# Patient Record
Sex: Male | Born: 1935
Health system: Southern US, Community
[De-identification: ages and names within clinical notes are randomized; demographics above are authoritative.]

## PROBLEM LIST (undated history)

## (undated) DIAGNOSIS — N189 Chronic kidney disease, unspecified: Secondary | ICD-10-CM

## (undated) DIAGNOSIS — Z972 Presence of dental prosthetic device (complete) (partial): Secondary | ICD-10-CM

## (undated) DIAGNOSIS — G4733 Obstructive sleep apnea (adult) (pediatric): Secondary | ICD-10-CM

## (undated) DIAGNOSIS — I10 Essential (primary) hypertension: Secondary | ICD-10-CM

## (undated) DIAGNOSIS — K5790 Diverticulosis of intestine, part unspecified, without perforation or abscess without bleeding: Secondary | ICD-10-CM

## (undated) DIAGNOSIS — E119 Type 2 diabetes mellitus without complications: Secondary | ICD-10-CM

## (undated) DIAGNOSIS — I509 Heart failure, unspecified: Secondary | ICD-10-CM

## (undated) DIAGNOSIS — E039 Hypothyroidism, unspecified: Secondary | ICD-10-CM

## (undated) DIAGNOSIS — E7800 Pure hypercholesterolemia, unspecified: Secondary | ICD-10-CM

## (undated) DIAGNOSIS — K219 Gastro-esophageal reflux disease without esophagitis: Secondary | ICD-10-CM

## (undated) HISTORY — PX: STOMACH SURGERY: SHX791

## (undated) HISTORY — PX: BACK SURGERY: SHX140

## (undated) HISTORY — PX: PILONIDAL CYST EXCISION: SHX744

## (undated) HISTORY — PX: HERNIA REPAIR: SHX51

## (undated) HISTORY — DX: Obstructive sleep apnea (adult) (pediatric): G47.33

## (undated) HISTORY — DX: Heart failure, unspecified: I50.9

## (undated) HISTORY — PX: COLONOSCOPY: SHX174

---

## 2005-09-14 ENCOUNTER — Other Ambulatory Visit: Payer: Self-pay

## 2005-09-14 ENCOUNTER — Emergency Department: Payer: Self-pay | Admitting: Emergency Medicine

## 2006-04-08 ENCOUNTER — Ambulatory Visit: Payer: Self-pay | Admitting: General Surgery

## 2006-05-08 ENCOUNTER — Ambulatory Visit: Payer: Self-pay | Admitting: Internal Medicine

## 2006-06-12 ENCOUNTER — Ambulatory Visit: Payer: Self-pay | Admitting: General Surgery

## 2006-06-19 ENCOUNTER — Inpatient Hospital Stay: Payer: Self-pay | Admitting: General Surgery

## 2006-08-04 ENCOUNTER — Ambulatory Visit: Payer: Self-pay | Admitting: Vascular Surgery

## 2009-04-06 ENCOUNTER — Ambulatory Visit: Payer: Self-pay | Admitting: Family Medicine

## 2009-11-06 LAB — CBC AND DIFFERENTIAL
HCT: 41 % (ref 41–53)
Hemoglobin: 13.5 g/dL (ref 13.5–17.5)
Neutrophils Absolute: 65 /uL
Platelets: 165 10*3/uL (ref 150–399)
WBC: 7.1 10^3/mL

## 2011-04-03 DIAGNOSIS — E7800 Pure hypercholesterolemia, unspecified: Secondary | ICD-10-CM | POA: Diagnosis not present

## 2011-04-03 DIAGNOSIS — E669 Obesity, unspecified: Secondary | ICD-10-CM | POA: Diagnosis not present

## 2011-04-03 DIAGNOSIS — I10 Essential (primary) hypertension: Secondary | ICD-10-CM | POA: Diagnosis not present

## 2011-04-03 DIAGNOSIS — E119 Type 2 diabetes mellitus without complications: Secondary | ICD-10-CM | POA: Diagnosis not present

## 2011-06-05 DIAGNOSIS — I10 Essential (primary) hypertension: Secondary | ICD-10-CM | POA: Diagnosis not present

## 2011-06-05 DIAGNOSIS — E7800 Pure hypercholesterolemia, unspecified: Secondary | ICD-10-CM | POA: Diagnosis not present

## 2011-06-05 DIAGNOSIS — E669 Obesity, unspecified: Secondary | ICD-10-CM | POA: Diagnosis not present

## 2011-06-05 DIAGNOSIS — E119 Type 2 diabetes mellitus without complications: Secondary | ICD-10-CM | POA: Diagnosis not present

## 2011-08-05 DIAGNOSIS — I10 Essential (primary) hypertension: Secondary | ICD-10-CM | POA: Diagnosis not present

## 2011-08-05 DIAGNOSIS — E7800 Pure hypercholesterolemia, unspecified: Secondary | ICD-10-CM | POA: Diagnosis not present

## 2011-08-05 DIAGNOSIS — M129 Arthropathy, unspecified: Secondary | ICD-10-CM | POA: Diagnosis not present

## 2011-08-05 DIAGNOSIS — E119 Type 2 diabetes mellitus without complications: Secondary | ICD-10-CM | POA: Diagnosis not present

## 2011-10-08 DIAGNOSIS — Z23 Encounter for immunization: Secondary | ICD-10-CM | POA: Diagnosis not present

## 2011-12-02 DIAGNOSIS — E7800 Pure hypercholesterolemia, unspecified: Secondary | ICD-10-CM | POA: Diagnosis not present

## 2011-12-02 DIAGNOSIS — E785 Hyperlipidemia, unspecified: Secondary | ICD-10-CM | POA: Diagnosis not present

## 2011-12-02 DIAGNOSIS — E119 Type 2 diabetes mellitus without complications: Secondary | ICD-10-CM | POA: Diagnosis not present

## 2011-12-02 DIAGNOSIS — I10 Essential (primary) hypertension: Secondary | ICD-10-CM | POA: Diagnosis not present

## 2011-12-02 DIAGNOSIS — E039 Hypothyroidism, unspecified: Secondary | ICD-10-CM | POA: Diagnosis not present

## 2011-12-02 DIAGNOSIS — E559 Vitamin D deficiency, unspecified: Secondary | ICD-10-CM | POA: Diagnosis not present

## 2011-12-02 DIAGNOSIS — M129 Arthropathy, unspecified: Secondary | ICD-10-CM | POA: Diagnosis not present

## 2011-12-30 DIAGNOSIS — M129 Arthropathy, unspecified: Secondary | ICD-10-CM | POA: Diagnosis not present

## 2011-12-30 DIAGNOSIS — E669 Obesity, unspecified: Secondary | ICD-10-CM | POA: Diagnosis not present

## 2011-12-30 DIAGNOSIS — I10 Essential (primary) hypertension: Secondary | ICD-10-CM | POA: Diagnosis not present

## 2011-12-30 DIAGNOSIS — E119 Type 2 diabetes mellitus without complications: Secondary | ICD-10-CM | POA: Diagnosis not present

## 2012-03-30 DIAGNOSIS — I10 Essential (primary) hypertension: Secondary | ICD-10-CM | POA: Diagnosis not present

## 2012-03-30 DIAGNOSIS — E119 Type 2 diabetes mellitus without complications: Secondary | ICD-10-CM | POA: Diagnosis not present

## 2012-03-30 DIAGNOSIS — E669 Obesity, unspecified: Secondary | ICD-10-CM | POA: Diagnosis not present

## 2012-06-08 DIAGNOSIS — E079 Disorder of thyroid, unspecified: Secondary | ICD-10-CM | POA: Diagnosis not present

## 2012-06-08 DIAGNOSIS — E039 Hypothyroidism, unspecified: Secondary | ICD-10-CM | POA: Diagnosis not present

## 2012-06-08 DIAGNOSIS — E119 Type 2 diabetes mellitus without complications: Secondary | ICD-10-CM | POA: Diagnosis not present

## 2012-06-08 DIAGNOSIS — Z23 Encounter for immunization: Secondary | ICD-10-CM | POA: Diagnosis not present

## 2012-06-08 DIAGNOSIS — E559 Vitamin D deficiency, unspecified: Secondary | ICD-10-CM | POA: Diagnosis not present

## 2012-11-19 DIAGNOSIS — E785 Hyperlipidemia, unspecified: Secondary | ICD-10-CM | POA: Diagnosis not present

## 2012-11-19 DIAGNOSIS — E7800 Pure hypercholesterolemia, unspecified: Secondary | ICD-10-CM | POA: Diagnosis not present

## 2012-11-19 DIAGNOSIS — I10 Essential (primary) hypertension: Secondary | ICD-10-CM | POA: Diagnosis not present

## 2012-11-19 DIAGNOSIS — E039 Hypothyroidism, unspecified: Secondary | ICD-10-CM | POA: Diagnosis not present

## 2012-11-19 DIAGNOSIS — E119 Type 2 diabetes mellitus without complications: Secondary | ICD-10-CM | POA: Diagnosis not present

## 2012-11-19 DIAGNOSIS — Z23 Encounter for immunization: Secondary | ICD-10-CM | POA: Diagnosis not present

## 2012-12-21 DIAGNOSIS — I10 Essential (primary) hypertension: Secondary | ICD-10-CM | POA: Diagnosis not present

## 2012-12-21 DIAGNOSIS — Z1331 Encounter for screening for depression: Secondary | ICD-10-CM | POA: Diagnosis not present

## 2012-12-21 DIAGNOSIS — F4320 Adjustment disorder, unspecified: Secondary | ICD-10-CM | POA: Diagnosis not present

## 2012-12-21 DIAGNOSIS — E119 Type 2 diabetes mellitus without complications: Secondary | ICD-10-CM | POA: Diagnosis not present

## 2012-12-21 DIAGNOSIS — E7800 Pure hypercholesterolemia, unspecified: Secondary | ICD-10-CM | POA: Diagnosis not present

## 2012-12-21 DIAGNOSIS — E669 Obesity, unspecified: Secondary | ICD-10-CM | POA: Diagnosis not present

## 2013-01-06 DIAGNOSIS — H2510 Age-related nuclear cataract, unspecified eye: Secondary | ICD-10-CM | POA: Diagnosis not present

## 2013-03-01 DIAGNOSIS — I10 Essential (primary) hypertension: Secondary | ICD-10-CM | POA: Diagnosis not present

## 2013-03-01 DIAGNOSIS — E1129 Type 2 diabetes mellitus with other diabetic kidney complication: Secondary | ICD-10-CM | POA: Diagnosis not present

## 2013-03-01 DIAGNOSIS — E7800 Pure hypercholesterolemia, unspecified: Secondary | ICD-10-CM | POA: Diagnosis not present

## 2013-06-30 DIAGNOSIS — E7800 Pure hypercholesterolemia, unspecified: Secondary | ICD-10-CM | POA: Diagnosis not present

## 2013-06-30 DIAGNOSIS — Z1331 Encounter for screening for depression: Secondary | ICD-10-CM | POA: Diagnosis not present

## 2013-06-30 DIAGNOSIS — I10 Essential (primary) hypertension: Secondary | ICD-10-CM | POA: Diagnosis not present

## 2013-06-30 DIAGNOSIS — Z125 Encounter for screening for malignant neoplasm of prostate: Secondary | ICD-10-CM | POA: Diagnosis not present

## 2013-06-30 DIAGNOSIS — Z0000 Encounter for general adult medical examination without abnormal findings: Secondary | ICD-10-CM | POA: Diagnosis not present

## 2013-06-30 DIAGNOSIS — Z1339 Encounter for screening examination for other mental health and behavioral disorders: Secondary | ICD-10-CM | POA: Diagnosis not present

## 2013-06-30 LAB — PSA: PSA: 5.4

## 2013-08-30 DIAGNOSIS — E785 Hyperlipidemia, unspecified: Secondary | ICD-10-CM | POA: Diagnosis not present

## 2013-08-30 DIAGNOSIS — E039 Hypothyroidism, unspecified: Secondary | ICD-10-CM | POA: Diagnosis not present

## 2013-08-30 DIAGNOSIS — E7800 Pure hypercholesterolemia, unspecified: Secondary | ICD-10-CM | POA: Diagnosis not present

## 2013-08-30 DIAGNOSIS — E669 Obesity, unspecified: Secondary | ICD-10-CM | POA: Diagnosis not present

## 2013-08-30 DIAGNOSIS — E1129 Type 2 diabetes mellitus with other diabetic kidney complication: Secondary | ICD-10-CM | POA: Diagnosis not present

## 2013-08-30 DIAGNOSIS — I10 Essential (primary) hypertension: Secondary | ICD-10-CM | POA: Diagnosis not present

## 2013-08-30 DIAGNOSIS — Z79899 Other long term (current) drug therapy: Secondary | ICD-10-CM | POA: Diagnosis not present

## 2013-08-30 LAB — HEPATIC FUNCTION PANEL
ALT: 16 U/L (ref 10–40)
AST: 13 U/L — AB (ref 14–40)
Alkaline Phosphatase: 67 U/L (ref 25–125)
Bilirubin, Total: 0.4 mg/dL

## 2013-08-30 LAB — LIPID PANEL
CHOLESTEROL: 144 mg/dL (ref 0–200)
HDL: 34 mg/dL — AB (ref 35–70)
LDL Cholesterol: 84 mg/dL
LDL/HDL RATIO: 2.5
Triglycerides: 132 mg/dL (ref 40–160)

## 2013-08-30 LAB — BASIC METABOLIC PANEL
BUN: 27 mg/dL — AB (ref 4–21)
CREATININE: 1.5 mg/dL — AB (ref <=1.3)
GLUCOSE: 283 mg/dL
POTASSIUM: 4.6 mmol/L (ref 3.4–5.3)
SODIUM: 283 mmol/L — AB (ref 137–147)

## 2013-08-30 LAB — TSH: TSH: 1.12 u[IU]/mL (ref <=5.90)

## 2013-10-26 DIAGNOSIS — Z23 Encounter for immunization: Secondary | ICD-10-CM | POA: Diagnosis not present

## 2013-12-13 DIAGNOSIS — E1122 Type 2 diabetes mellitus with diabetic chronic kidney disease: Secondary | ICD-10-CM | POA: Diagnosis not present

## 2013-12-13 DIAGNOSIS — I10 Essential (primary) hypertension: Secondary | ICD-10-CM | POA: Diagnosis not present

## 2013-12-13 DIAGNOSIS — Z23 Encounter for immunization: Secondary | ICD-10-CM | POA: Diagnosis not present

## 2013-12-13 DIAGNOSIS — E039 Hypothyroidism, unspecified: Secondary | ICD-10-CM | POA: Diagnosis not present

## 2013-12-13 DIAGNOSIS — E669 Obesity, unspecified: Secondary | ICD-10-CM | POA: Diagnosis not present

## 2014-01-31 DIAGNOSIS — H40003 Preglaucoma, unspecified, bilateral: Secondary | ICD-10-CM | POA: Diagnosis not present

## 2014-03-16 DIAGNOSIS — H2513 Age-related nuclear cataract, bilateral: Secondary | ICD-10-CM | POA: Diagnosis not present

## 2014-04-11 DIAGNOSIS — I10 Essential (primary) hypertension: Secondary | ICD-10-CM | POA: Diagnosis not present

## 2014-04-11 DIAGNOSIS — E039 Hypothyroidism, unspecified: Secondary | ICD-10-CM | POA: Diagnosis not present

## 2014-04-11 DIAGNOSIS — Z23 Encounter for immunization: Secondary | ICD-10-CM | POA: Diagnosis not present

## 2014-04-11 DIAGNOSIS — E1122 Type 2 diabetes mellitus with diabetic chronic kidney disease: Secondary | ICD-10-CM | POA: Diagnosis not present

## 2014-04-11 DIAGNOSIS — E780 Pure hypercholesterolemia: Secondary | ICD-10-CM | POA: Diagnosis not present

## 2014-04-11 LAB — HEMOGLOBIN A1C: Hgb A1c MFr Bld: 9.2 % — AB (ref 4.0–6.0)

## 2014-05-17 DIAGNOSIS — E039 Hypothyroidism, unspecified: Secondary | ICD-10-CM | POA: Insufficient documentation

## 2014-05-17 DIAGNOSIS — E559 Vitamin D deficiency, unspecified: Secondary | ICD-10-CM | POA: Insufficient documentation

## 2014-05-17 DIAGNOSIS — N529 Male erectile dysfunction, unspecified: Secondary | ICD-10-CM | POA: Insufficient documentation

## 2014-05-17 DIAGNOSIS — M1990 Unspecified osteoarthritis, unspecified site: Secondary | ICD-10-CM | POA: Insufficient documentation

## 2014-05-17 DIAGNOSIS — I10 Essential (primary) hypertension: Secondary | ICD-10-CM | POA: Insufficient documentation

## 2014-05-17 DIAGNOSIS — I701 Atherosclerosis of renal artery: Secondary | ICD-10-CM | POA: Insufficient documentation

## 2014-05-17 DIAGNOSIS — E119 Type 2 diabetes mellitus without complications: Secondary | ICD-10-CM | POA: Insufficient documentation

## 2014-05-17 DIAGNOSIS — G4730 Sleep apnea, unspecified: Secondary | ICD-10-CM | POA: Insufficient documentation

## 2014-05-17 DIAGNOSIS — E785 Hyperlipidemia, unspecified: Secondary | ICD-10-CM | POA: Insufficient documentation

## 2014-05-17 DIAGNOSIS — E669 Obesity, unspecified: Secondary | ICD-10-CM | POA: Insufficient documentation

## 2014-06-28 ENCOUNTER — Encounter: Payer: Self-pay | Admitting: Family Medicine

## 2014-07-09 ENCOUNTER — Other Ambulatory Visit: Payer: Self-pay | Admitting: Family Medicine

## 2014-07-18 ENCOUNTER — Ambulatory Visit (INDEPENDENT_AMBULATORY_CARE_PROVIDER_SITE_OTHER): Payer: Medicare Other | Admitting: Family Medicine

## 2014-07-18 ENCOUNTER — Encounter: Payer: Self-pay | Admitting: Family Medicine

## 2014-07-18 VITALS — BP 130/82 | HR 66 | Temp 98.0°F | Resp 16 | Wt 223.2 lb

## 2014-07-18 DIAGNOSIS — I10 Essential (primary) hypertension: Secondary | ICD-10-CM | POA: Diagnosis not present

## 2014-07-18 DIAGNOSIS — E7800 Pure hypercholesterolemia, unspecified: Secondary | ICD-10-CM

## 2014-07-18 DIAGNOSIS — E039 Hypothyroidism, unspecified: Secondary | ICD-10-CM | POA: Diagnosis not present

## 2014-07-18 DIAGNOSIS — E119 Type 2 diabetes mellitus without complications: Secondary | ICD-10-CM

## 2014-07-18 LAB — POCT GLYCOSYLATED HEMOGLOBIN (HGB A1C): HEMOGLOBIN A1C: 7.6

## 2014-07-18 NOTE — Progress Notes (Signed)
Patient ID: Randy Dalton, male   DOB: [birth year], 81 y.o.   MRN: [id number]    Subjective:  Hyperlipidemia This is a chronic problem.  Diabetes He presents for his follow-up diabetic visit. He has type 2 (last a1c 9.1 on 04/11/14) diabetes mellitus. There are no hypoglycemic associated symptoms. Pertinent negatives for hypoglycemia include no headaches. Pertinent negatives for diabetes include no blurred vision. There are no hypoglycemic complications. There are no diabetic complications. His dinner blood glucose is taken after 8 pm. His dinner blood glucose range is generally 140-180 mg/dl.  Hypertension This is a chronic problem. Pertinent negatives include no anxiety, blurred vision or headaches.     Prior to Admission medications   Medication Sig Start Date End Date Taking? Authorizing Provider  amLODipine (NORVASC) 5 MG tablet Take by mouth. 05/05/14   Historical Provider, MD  carvedilol (COREG) 12.5 MG tablet Take by mouth. 12/06/13   Historical Provider, MD  Cholecalciferol (VITAMIN D) 2000 UNITS tablet Take by mouth. 11/08/10   Historical Provider, MD  glimepiride (AMARYL) 4 MG tablet Take by mouth. 02/10/14   Historical Provider, MD  hydrALAZINE (APRESOLINE) 50 MG tablet Take by mouth. 01/17/14   Historical Provider, MD  hydrochlorothiazide (HYDRODIURIL) 25 MG tablet Take by mouth. 07/06/13   Historical Provider, MD  levothyroxine (SYNTHROID, LEVOTHROID) 175 MCG tablet Take by mouth. 12/17/13   Historical Provider, MD  linagliptin (TRADJENTA) 5 MG TABS tablet Take by mouth. 04/18/14   Historical Provider, MD  losartan (COZAAR) 100 MG tablet TAKE ONE TABLET BY MOUTH EVERY DAY 07/09/14   Jerrol Banana., MD  pioglitazone (ACTOS) 15 MG tablet Take by mouth. 08/30/13   Historical Provider, MD  sildenafil (VIAGRA) 100 MG tablet Take by mouth. 10/09/13   Historical Provider, MD  simvastatin (ZOCOR) 20 MG tablet Take by mouth. 04/14/14   Historical Provider, MD    Patient Active Problem List   Diagnosis Date Noted  . Atherosclerosis of renal artery 05/17/2014  . ED (erectile dysfunction) of organic origin 05/17/2014  . Essential (primary) hypertension 05/17/2014  . HLD (hyperlipidemia) 05/17/2014  . Adult hypothyroidism 05/17/2014  . Adiposity 05/17/2014  . Arthritis, degenerative 05/17/2014  . Apnea, sleep 05/17/2014  . Diabetes mellitus, type 2 05/17/2014  . Avitaminosis D 05/17/2014    No past medical history on file.  History   Social History  . Marital Status: Married    Spouse Name: N/A  . Number of Children: N/A  . Years of Education: N/A   Occupational History  . Not on file.   Social History Main Topics  . Smoking status: Never Smoker   . Smokeless tobacco: Not on file  . Alcohol Use: Not on file  . Drug Use: Not on file  . Sexual Activity: Not on file   Other Topics Concern  . Not on file   Social History Narrative    Allergies  Allergen Reactions  . Aspirin     Feel bad  . Codeine Nausea Only    Review of Systems  Constitutional: Negative.   HENT:       Went to the dentist, my were cleaned and it was rough which my gums bleeding, she took my plate out and cleaned it with I don't know what, after I started eating something, my mouth became sour, and blistered, but eventually it went away.   Eyes: Negative for blurred vision.       Went to see an eye Dr.  Abbott Pao has new prescription  about eye drops.  Respiratory: Negative.   Cardiovascular: Negative.   Gastrointestinal: Negative.   Genitourinary: Negative.   Musculoskeletal: Negative.   Neurological: Negative for headaches.  Endo/Heme/Allergies: Negative.   Psychiatric/Behavioral: Negative.     Immunization History  Administered Date(s) Administered  . Pneumococcal Conjugate-13 12/13/2013  . Pneumococcal Polysaccharide-23 06/08/2012  . Td 04/28/2003   Objective:  BP 130/82 mmHg  Pulse 66  Temp(Src) 98 F (36.7 C) (Oral)  Resp 16  Wt 223 lb 3.2 oz (101.243 kg)  Physical Exam    Constitutional: He is oriented to person, place, and time and well-developed, well-nourished, and in no distress.  Obese  HENT:  Head: Normocephalic and atraumatic.  Right Ear: External ear normal.  Left Ear: External ear normal.  Nose: Nose normal.  Eyes: Conjunctivae and EOM are normal. Pupils are equal, round, and reactive to light.  Neck: Normal range of motion. Neck supple.  Cardiovascular: Normal rate, regular rhythm and normal heart sounds.   Pulmonary/Chest: Effort normal and breath sounds normal.  Abdominal: Soft. Bowel sounds are normal.  Musculoskeletal: Normal range of motion.  Neurological: He is alert and oriented to person, place, and time. Gait normal.  Skin: Skin is warm and dry.  Psychiatric: Mood, memory, affect and judgment normal.  Vitals reviewed.   Lab Results  Component Value Date   WBC 7.1 11/06/2009   HGB 13.5 11/06/2009   HCT 41 11/06/2009   PLT 165 11/06/2009   CHOL 144 08/30/2013   TRIG 132 08/30/2013   HDL 34* 08/30/2013   LDLCALC 84 08/30/2013   TSH 1.12 08/30/2013   PSA 5.4 06/30/2013   HGBA1C 9.2* 04/11/2014    CMP     Component Value Date/Time   NA 283* 08/30/2013   K 4.6 08/30/2013   BUN 27* 08/30/2013   CREATININE 1.5* 08/30/2013   AST 13* 08/30/2013   ALT 16 08/30/2013   ALKPHOS 67 08/30/2013    Assessment and Plan :  Type 2 diabetes Fair Control with A1c of 7.6 Obesity Hypertension Hyperlipidemia Hypothyroidism [facility] 07/18/2014 10:39 AM

## 2014-07-19 LAB — LIPID PANEL WITH LDL/HDL RATIO
Cholesterol, Total: 173 mg/dL (ref 100–199)
HDL: 31 mg/dL — ABNORMAL LOW (ref >39)
LDL CALC: 103 mg/dL — AB (ref 0–99)
LDl/HDL Ratio: 3.3 ratio units (ref 0.0–3.6)
TRIGLYCERIDES: 193 mg/dL — AB (ref 0–149)
VLDL Cholesterol Cal: 39 mg/dL (ref 5–40)

## 2014-07-19 LAB — TSH: TSH: 0.489 u[IU]/mL (ref 0.450–4.500)

## 2014-07-20 ENCOUNTER — Telehealth: Payer: Self-pay

## 2014-07-20 NOTE — Telephone Encounter (Signed)
Advised  ED 

## 2014-07-20 NOTE — Telephone Encounter (Signed)
-----   Message from Jerrol Banana., MD sent at 07/20/2014  2:14 PM EDT ----- Labs stable.

## 2014-07-21 LAB — CBC WITH DIFFERENTIAL/PLATELET

## 2014-07-24 ENCOUNTER — Other Ambulatory Visit: Payer: Self-pay | Admitting: Family Medicine

## 2014-09-22 ENCOUNTER — Other Ambulatory Visit: Payer: Self-pay | Admitting: Family Medicine

## 2014-09-28 DIAGNOSIS — H2513 Age-related nuclear cataract, bilateral: Secondary | ICD-10-CM | POA: Diagnosis not present

## 2014-10-19 DIAGNOSIS — H2513 Age-related nuclear cataract, bilateral: Secondary | ICD-10-CM | POA: Diagnosis not present

## 2014-10-20 ENCOUNTER — Encounter: Payer: Self-pay | Admitting: *Deleted

## 2014-10-21 NOTE — Discharge Instructions (Signed)

## 2014-10-24 ENCOUNTER — Ambulatory Visit: Payer: Medicare Other | Admitting: Anesthesiology

## 2014-10-24 ENCOUNTER — Encounter
Admission: RE | Disposition: A | Discharge status: Home / Self Care | Payer: Self-pay | Source: Ambulatory Visit | Attending: Ophthalmology

## 2014-10-24 ENCOUNTER — Ambulatory Visit
Admission: RE | Admit: 2014-10-24 | Discharge: 2014-10-24 | Disposition: A | Discharge status: Home / Self Care | Payer: Medicare Other | Source: Ambulatory Visit | Attending: Ophthalmology | Admitting: Ophthalmology

## 2014-10-24 DIAGNOSIS — E079 Disorder of thyroid, unspecified: Secondary | ICD-10-CM | POA: Insufficient documentation

## 2014-10-24 DIAGNOSIS — E119 Type 2 diabetes mellitus without complications: Secondary | ICD-10-CM | POA: Insufficient documentation

## 2014-10-24 DIAGNOSIS — I10 Essential (primary) hypertension: Secondary | ICD-10-CM | POA: Insufficient documentation

## 2014-10-24 DIAGNOSIS — K5790 Diverticulosis of intestine, part unspecified, without perforation or abscess without bleeding: Secondary | ICD-10-CM | POA: Diagnosis not present

## 2014-10-24 DIAGNOSIS — E7800 Pure hypercholesterolemia, unspecified: Secondary | ICD-10-CM | POA: Diagnosis not present

## 2014-10-24 DIAGNOSIS — H2513 Age-related nuclear cataract, bilateral: Secondary | ICD-10-CM | POA: Diagnosis not present

## 2014-10-24 DIAGNOSIS — H2512 Age-related nuclear cataract, left eye: Secondary | ICD-10-CM | POA: Diagnosis not present

## 2014-10-24 DIAGNOSIS — Z886 Allergy status to analgesic agent status: Secondary | ICD-10-CM | POA: Insufficient documentation

## 2014-10-24 HISTORY — DX: Presence of dental prosthetic device (complete) (partial): Z97.2

## 2014-10-24 HISTORY — DX: Hypothyroidism, unspecified: E03.9

## 2014-10-24 HISTORY — DX: Type 2 diabetes mellitus without complications: E11.9

## 2014-10-24 HISTORY — DX: Essential (primary) hypertension: I10

## 2014-10-24 HISTORY — PX: CATARACT EXTRACTION W/PHACO: SHX586

## 2014-10-24 HISTORY — DX: Diverticulosis of intestine, part unspecified, without perforation or abscess without bleeding: K57.90

## 2014-10-24 HISTORY — DX: Pure hypercholesterolemia, unspecified: E78.00

## 2014-10-24 LAB — GLUCOSE, CAPILLARY
GLUCOSE-CAPILLARY: 129 mg/dL — AB (ref 65–99)
GLUCOSE-CAPILLARY: 144 mg/dL — AB (ref 65–99)

## 2014-10-24 SURGERY — PHACOEMULSIFICATION, CATARACT, WITH IOL INSERTION
Anesthesia: Monitor Anesthesia Care | Laterality: Left | Wound class: Clean

## 2014-10-24 MED ORDER — FENTANYL CITRATE (PF) 100 MCG/2ML IJ SOLN
25.0000 ug | INTRAMUSCULAR | Status: DC | PRN
  Administered 2014-10-24: 50 ug via INTRAVENOUS

## 2014-10-24 MED ORDER — OXYCODONE HCL 5 MG PO TABS: 5.0000 mg | ORAL_TABLET | Freq: Once | ORAL | Status: DC | PRN

## 2014-10-24 MED ORDER — DEXAMETHASONE SODIUM PHOSPHATE 4 MG/ML IJ SOLN: 8.0000 mg | Freq: Once | INTRAMUSCULAR | Status: DC | PRN

## 2014-10-24 MED ORDER — LIDOCAINE HCL (PF) 4 % IJ SOLN
INTRAOCULAR | Status: DC | PRN
  Administered 2014-10-24: 1 mL via OPHTHALMIC

## 2014-10-24 MED ORDER — MIDAZOLAM HCL 2 MG/2ML IJ SOLN
INTRAMUSCULAR | Status: DC | PRN
  Administered 2014-10-24: 2 mg via INTRAVENOUS

## 2014-10-24 MED ORDER — ACETAMINOPHEN 160 MG/5ML PO SOLN: 325.0000 mg | ORAL | Status: DC | PRN

## 2014-10-24 MED ORDER — TIMOLOL MALEATE 0.5 % OP SOLN
OPHTHALMIC | Status: DC | PRN
  Administered 2014-10-24: 1 [drp]

## 2014-10-24 MED ORDER — POVIDONE-IODINE 5 % OP SOLN
1.0000 "application " | OPHTHALMIC | Status: DC | PRN
  Administered 2014-10-24: 1 via OPHTHALMIC

## 2014-10-24 MED ORDER — BRIMONIDINE TARTRATE 0.2 % OP SOLN
OPHTHALMIC | Status: DC | PRN
  Administered 2014-10-24: 1 [drp]

## 2014-10-24 MED ORDER — CEFUROXIME OPHTHALMIC INJECTION 1 MG/0.1 ML
INJECTION | OPHTHALMIC | Status: DC | PRN
  Administered 2014-10-24: 0.1 mL via INTRACAMERAL

## 2014-10-24 MED ORDER — TETRACAINE HCL 0.5 % OP SOLN
1.0000 [drp] | OPHTHALMIC | Status: DC | PRN
Start: 2014-10-24 — End: 2014-10-24
  Administered 2014-10-24: 1 [drp] via OPHTHALMIC

## 2014-10-24 MED ORDER — NA HYALUR & NA CHOND-NA HYALUR 0.4-0.35 ML IO KIT
PACK | INTRAOCULAR | Status: DC | PRN
  Administered 2014-10-24: 1 mL via INTRAOCULAR

## 2014-10-24 MED ORDER — ACETAMINOPHEN 325 MG PO TABS: 325.0000 mg | ORAL_TABLET | ORAL | Status: DC | PRN

## 2014-10-24 MED ORDER — OXYCODONE HCL 5 MG/5ML PO SOLN: 5.0000 mg | Freq: Once | ORAL | Status: DC | PRN

## 2014-10-24 MED ORDER — LACTATED RINGERS IV SOLN: 500.0000 mL | INTRAVENOUS | Status: DC

## 2014-10-24 MED ORDER — EPINEPHRINE HCL 1 MG/ML IJ SOLN
INTRAOCULAR | Status: DC | PRN
  Administered 2014-10-24: 154 mL via OPHTHALMIC

## 2014-10-24 MED ORDER — LACTATED RINGERS IV SOLN: INTRAVENOUS | Status: DC

## 2014-10-24 MED ORDER — ARMC OPHTHALMIC DILATING GEL
1.0000 "application " | OPHTHALMIC | Status: DC | PRN
  Administered 2014-10-24 (×2): 1 via OPHTHALMIC

## 2014-10-24 SURGICAL SUPPLY — 29 items
APPLICATOR COTTON TIP 3IN (MISCELLANEOUS) ×3 IMPLANT
CANNULA ANT/CHMB 27GA (MISCELLANEOUS) ×3 IMPLANT
DISSECTOR HYDRO NUCLEUS 50X22 (MISCELLANEOUS) ×3 IMPLANT
GLOVE BIO SURGEON STRL SZ7 (GLOVE) ×3 IMPLANT
GLOVE SURG LX 6.5 MICRO (GLOVE) ×2
GLOVE SURG LX STRL 6.5 MICRO (GLOVE) ×1 IMPLANT
GOWN STRL REUS W/ TWL LRG LVL3 (GOWN DISPOSABLE) ×2 IMPLANT
GOWN STRL REUS W/TWL LRG LVL3 (GOWN DISPOSABLE) ×4
LENS IOL ACRSF IQ PC 19.0 (Intraocular Lens) ×1 IMPLANT
LENS IOL ACRYSOF IQ POST 19.0 (Intraocular Lens) ×3 IMPLANT
MARKER SKIN SURG W/RULER VIO (MISCELLANEOUS) ×3 IMPLANT
NEEDLE FILTER BLUNT 18X 1/2SAF (NEEDLE) ×4
NEEDLE FILTER BLUNT 18X1 1/2 (NEEDLE) ×2 IMPLANT
PACK CATARACT BRASINGTON (MISCELLANEOUS) ×3 IMPLANT
PACK EYE AFTER SURG (MISCELLANEOUS) ×3 IMPLANT
PACK OPTHALMIC (MISCELLANEOUS) ×3 IMPLANT
RING MALYGIN 7.0 (MISCELLANEOUS) IMPLANT
SOL BAL SALT 15ML (MISCELLANEOUS)
SOLUTION BAL SALT 15ML (MISCELLANEOUS) IMPLANT
SUT ETHILON 10-0 CS-B-6CS-B-6 (SUTURE)
SUT VICRYL  9 0 (SUTURE)
SUT VICRYL 9 0 (SUTURE) IMPLANT
SUTURE EHLN 10-0 CS-B-6CS-B-6 (SUTURE) IMPLANT
SYR 3ML LL SCALE MARK (SYRINGE) ×6 IMPLANT
SYR TB 1ML LUER SLIP (SYRINGE) ×3 IMPLANT
WATER STERILE IRR 250ML POUR (IV SOLUTION) ×3 IMPLANT
WATER STERILE IRR 500ML POUR (IV SOLUTION) IMPLANT
WICK EYE OCUCEL (MISCELLANEOUS) IMPLANT
WIPE NON LINTING 3.25X3.25 (MISCELLANEOUS) ×3 IMPLANT

## 2014-10-24 NOTE — Anesthesia Preprocedure Evaluation (Signed)
Anesthesia Evaluation  Patient identified by MRN, date of birth, ID band Patient awake    Reviewed: Allergy & Precautions, H&P , NPO status , Patient's Chart, lab work & pertinent test results, reviewed documented beta blocker date and time   Airway Mallampati: II  TM Distance: >3 FB Neck ROM: full    Dental no notable dental hx.    Pulmonary sleep apnea ,    Pulmonary exam normal breath sounds clear to auscultation       Cardiovascular Exercise Tolerance: Good hypertension,  Rhythm:regular Rate:Normal     Neuro/Psych negative neurological ROS  negative psych ROS   GI/Hepatic negative GI ROS, Neg liver ROS,   Endo/Other  diabetesHypothyroidism   Renal/GU Renal hypertensionRenal disease  negative genitourinary   Musculoskeletal   Abdominal   Peds  Hematology negative hematology ROS (+)   Anesthesia Other Findings   Reproductive/Obstetrics negative OB ROS                             Anesthesia Physical Anesthesia Plan  ASA: III  Anesthesia Plan: MAC   Post-op Pain Management:    Induction:   Airway Management Planned:   Additional Equipment:   Intra-op Plan:   Post-operative Plan:   Informed Consent: I have reviewed the patients History and Physical, chart, labs and discussed the procedure including the risks, benefits and alternatives for the proposed anesthesia with the patient or authorized representative who has indicated his/her understanding and acceptance.     Plan Discussed with: CRNA  Anesthesia Plan Comments:         Anesthesia Quick Evaluation

## 2014-10-24 NOTE — Op Note (Signed)
Date of Surgery: 10/24/2014  PREOPERATIVE DIAGNOSES: Visually significant nuclear sclerotic cataract, left eye.  POSTOPERATIVE DIAGNOSES: Same  PROCEDURES PERFORMED: Cataract extraction with intraocular lens implant, left eye.  SURGEON: Almon Hercules, M.D.  ANESTHESIA: MAC and topical  IMPLANTS: AcrySof IQ [id number] +19.0   Implant Name Type Inv. Item Serial No. Manufacturer Lot No. LRB No. Used  IMPLANT LENS - [id number] Intraocular Lens IMPLANT LENS [id number] ALCON   Left 1    COMPLICATIONS: None.  DESCRIPTION OF PROCEDURE: Therapeutic options were discussed with the patient preoperatively, including a discussion of risks and benefits of surgery. Informed consent was obtained. An IOL-Master and immersion biometry were used to take the lens measurements, and a dilated fundus exam was performed within 6 months of the surgical date.  The patient was premedicated and brought to the operating room and placed on the operating table in the supine position. After adequate anesthesia, the patient was prepped and draped in the usual sterile ophthalmic fashion. A wire lid speculum was inserted and the microscope was positioned. A Superblade was used to create a paracentesis site at the limbus and a small amount of dilute preservative free lidocaine was instilled into the anterior chamber, followed by dispersive viscoelastic. A clear corneal incision was created temporally using a 2.4 mm keratome blade. Capsulorrhexis was then performed. In situ phacoemulsification was performed.  Cortical material was removed with the irrigation-aspiration unit. Dispersive viscoelastic was instilled to open the capsular bag. A posterior chamber intraocular lens with the specifications above was inserted and positioned. Irrigation-aspiration was used to remove all viscoelastic. Cefuroxime 1cc was instilled into the anterior chamber, and the corneal incision was checked and found to be water tight. The eyelid speculum  was removed.  The operative eye was covered with protective goggles after instilling 1 drop of timolol and brimonidine. The patient tolerated the procedure well. There were no complications.

## 2014-10-24 NOTE — Transfer of Care (Signed)
Immediate Anesthesia Transfer of Care Note  Patient: Randy Dalton  Procedure(s) Performed: Procedure(s) with comments: CATARACT EXTRACTION PHACO AND INTRAOCULAR LENS PLACEMENT (IOC) (Left) - DIABETIC - oral meds  Patient Location: PACU  Anesthesia Type: MAC  Level of Consciousness: awake, alert  and patient cooperative  Airway and Oxygen Therapy: Patient Spontanous Breathing and Patient connected to supplemental oxygen  Post-op Assessment: Post-op Vital signs reviewed, Patient's Cardiovascular Status Stable, Respiratory Function Stable, Patent Airway and No signs of Nausea or vomiting  Post-op Vital Signs: Reviewed and stable  Complications: No apparent anesthesia complications

## 2014-10-24 NOTE — Anesthesia Procedure Notes (Signed)
Procedure Name: MAC Performed by: Shakema Surita Pre-anesthesia Checklist: Patient identified, Emergency Drugs available, Suction available, Timeout performed and Patient being monitored Patient Re-evaluated:Patient Re-evaluated prior to inductionOxygen Delivery Method: Nasal cannula Placement Confirmation: positive ETCO2     

## 2014-10-24 NOTE — H&P (Signed)
H+P reviewed and is up to date, please see paper chart.  

## 2014-10-24 NOTE — Anesthesia Postprocedure Evaluation (Signed)
  Anesthesia Post-op Note  Patient: Randy Dalton  Procedure(s) Performed: Procedure(s) with comments: CATARACT EXTRACTION PHACO AND INTRAOCULAR LENS PLACEMENT (IOC) (Left) - DIABETIC - oral meds  Anesthesia type:MAC  Patient location: PACU  Post pain: Pain level controlled  Post assessment: Post-op Vital signs reviewed, Patient's Cardiovascular Status Stable, Respiratory Function Stable, Patent Airway and No signs of Nausea or vomiting  Post vital signs: Reviewed and stable  Last Vitals:  Filed Vitals:   10/24/14 0826  BP: 146/77  Pulse: 55  Temp:   Resp: 15    Level of consciousness: awake, alert  and patient cooperative  Complications: No apparent anesthesia complications

## 2014-10-25 ENCOUNTER — Encounter: Payer: Self-pay | Admitting: Ophthalmology

## 2014-11-09 ENCOUNTER — Encounter: Payer: Self-pay | Admitting: Family Medicine

## 2014-11-23 ENCOUNTER — Encounter: Payer: Self-pay | Admitting: Family Medicine

## 2014-11-23 ENCOUNTER — Ambulatory Visit (INDEPENDENT_AMBULATORY_CARE_PROVIDER_SITE_OTHER): Payer: Medicare Other | Admitting: Family Medicine

## 2014-11-23 VITALS — BP 138/80 | HR 60 | Temp 97.5°F | Resp 16 | Wt 225.0 lb

## 2014-11-23 DIAGNOSIS — E119 Type 2 diabetes mellitus without complications: Secondary | ICD-10-CM | POA: Diagnosis not present

## 2014-11-23 DIAGNOSIS — I10 Essential (primary) hypertension: Secondary | ICD-10-CM

## 2014-11-23 DIAGNOSIS — Z794 Long term (current) use of insulin: Secondary | ICD-10-CM | POA: Diagnosis not present

## 2014-11-23 DIAGNOSIS — Z23 Encounter for immunization: Secondary | ICD-10-CM | POA: Diagnosis not present

## 2014-11-23 LAB — POCT GLYCOSYLATED HEMOGLOBIN (HGB A1C): Hemoglobin A1C: 6.6

## 2014-11-23 NOTE — Progress Notes (Signed)
Patient ID: Randy Dalton, male   DOB: Sep 12, 1927, 81 y.o.   MRN: [id number]    Subjective:  HPI   Hypertension, follow-up:  BP Readings from Last 3 Encounters:  11/23/14 138/80  10/24/14 146/77  07/18/14 130/82    He was last seen for hypertension 4 months ago.  BP at that visit was 146/77. Management since that visit includes none. He reports good compliance with treatment. He is not having side effects.  He is not exercising.   Outside blood pressures are running about 130's/80's. He is experiencing none.  Patient denies chest pain, chest pressure/discomfort, claudication, dyspnea, exertional chest pressure/discomfort, fatigue, irregular heart beat, lower extremity edema and palpitations.   Cardiovascular risk factors include advanced age (older than 31 for men, 38 for women), diabetes mellitus, dyslipidemia, hypertension, male gender and obesity (BMI >= 30 kg/m2).   Wt Readings from Last 3 Encounters:  11/23/14 225 lb (102.059 kg)  10/24/14 221 lb (100.245 kg)  07/18/14 223 lb 3.2 oz (101.243 kg)   ------------------------------------------------------------------------    Diabetes Mellitus Type II, Follow-up:   Lab Results  Component Value Date   HGBA1C 7.6 07/18/2014   HGBA1C 9.2* 04/11/2014    Last seen for diabetes 4 months ago.  Management since then includes none. He reports good compliance with treatment. He is not having side effects.  Home blood sugar records: are not being checked at home  Episodes of hypoglycemia? no   Current Insulin Regimen: none Most Recent Eye Exam: within the last year  Pertinent Labs:    Component Value Date/Time   CHOL 173 07/18/2014 1133   CHOL 144 08/30/2013   TRIG 193* 07/18/2014 1133   CREATININE 1.5* 08/30/2013    Wt Readings from Last 3 Encounters:  11/23/14 225 lb (102.059 kg)  10/24/14 221 lb (100.245 kg)  07/18/14 223 lb 3.2 oz (101.243 kg)     ------------------------------------------------------------------------     Prior to Admission medications   Medication Sig Start Date End Date Taking? Authorizing Provider  amLODipine (NORVASC) 5 MG tablet Take by mouth. 05/05/14  Yes Historical Provider, MD  carvedilol (COREG) 12.5 MG tablet Take by mouth. 12/06/13  Yes Historical Provider, MD  Cholecalciferol (VITAMIN D) 2000 UNITS tablet Take by mouth. 11/08/10  Yes Historical Provider, MD  glimepiride (AMARYL) 4 MG tablet Take by mouth. 02/10/14  Yes Historical Provider, MD  hydrALAZINE (APRESOLINE) 50 MG tablet Take by mouth. 01/17/14  Yes Historical Provider, MD  hydrochlorothiazide (HYDRODIURIL) 25 MG tablet TAKE ONE TABLET BY MOUTH EVERY DAY 07/25/14  Yes Richard Maceo Pro., MD  latanoprost (XALATAN) 0.005 % ophthalmic solution 1 drop at bedtime.   Yes Historical Provider, MD  levothyroxine (SYNTHROID, LEVOTHROID) 175 MCG tablet Take by mouth. 12/17/13  Yes Historical Provider, MD  linagliptin (TRADJENTA) 5 MG TABS tablet Take by mouth. 04/18/14  Yes Historical Provider, MD  losartan (COZAAR) 100 MG tablet TAKE ONE TABLET BY MOUTH EVERY DAY 07/09/14  Yes Jerrol Banana., MD  Multiple Vitamin (MULTIVITAMIN) tablet Take 1 tablet by mouth daily.   Yes Historical Provider, MD  pioglitazone (ACTOS) 15 MG tablet TAKE ONE TABLET BY MOUTH EVERY DAY 09/22/14  Yes Jerrol Banana., MD  sildenafil (VIAGRA) 100 MG tablet Take by mouth. 10/09/13  Yes Historical Provider, MD  simvastatin (ZOCOR) 20 MG tablet Take by mouth. 04/14/14  Yes Historical Provider, MD    Patient Active Problem List   Diagnosis Date Noted  . Atherosclerosis of renal artery ([place]) 05/17/2014  .  ED (erectile dysfunction) of organic origin 05/17/2014  . Essential (primary) hypertension 05/17/2014  . HLD (hyperlipidemia) 05/17/2014  . Adult hypothyroidism 05/17/2014  . Adiposity 05/17/2014  . Arthritis, degenerative 05/17/2014  . Apnea, sleep 05/17/2014  .  Diabetes mellitus, type 2 ([place]) 05/17/2014  . Avitaminosis D 05/17/2014    Past Medical History  Diagnosis Date  . Hypertension   . Diabetes mellitus without complication ([place])   . Hypothyroidism   . Hypercholesteremia   . Diverticulosis   . Wears dentures     partial top    Social History   Social History  . Marital Status: Married    Spouse Name: N/A  . Number of Children: N/A  . Years of Education: N/A   Occupational History  . Not on file.   Social History Main Topics  . Smoking status: Never Smoker   . Smokeless tobacco: Not on file  . Alcohol Use: No  . Drug Use: No  . Sexual Activity: Not on file   Other Topics Concern  . Not on file   Social History Narrative    Allergies  Allergen Reactions  . Aspirin     Feel bad  . Codeine Nausea Only    Review of Systems  Constitutional: Negative.   HENT: Negative.   Eyes: Negative.   Respiratory: Negative.   Cardiovascular: Negative.   Gastrointestinal: Negative.   Genitourinary: Negative.   Musculoskeletal: Negative.   Skin: Negative.   Neurological: Negative.   Endo/Heme/Allergies: Negative.   Psychiatric/Behavioral: Negative.     Immunization History  Administered Date(s) Administered  . Pneumococcal Conjugate-13 12/13/2013  . Pneumococcal Polysaccharide-23 06/08/2012  . Td 04/28/2003   Objective:  BP 138/80 mmHg  Pulse 60  Temp(Src) 97.5 F (36.4 C) (Oral)  Resp 16  Wt 225 lb (102.059 kg)  Physical Exam  Lab Results  Component Value Date   WBC CANCELED 07/18/2014   HGB 13.5 11/06/2009   HCT CANCELED 07/18/2014   PLT 165 11/06/2009   CHOL 173 07/18/2014   TRIG 193* 07/18/2014   HDL 31* 07/18/2014   LDLCALC 103* 07/18/2014   TSH 0.489 07/18/2014   PSA 5.4 06/30/2013   HGBA1C 7.6 07/18/2014    CMP     Component Value Date/Time   NA 283* 08/30/2013   K 4.6 08/30/2013   BUN 27* 08/30/2013   CREATININE 1.5* 08/30/2013   AST 13* 08/30/2013   ALT 16 08/30/2013   ALKPHOS 67  08/30/2013    Assessment and Plan :  1. Essential (primary) hypertension   2. Type 2 diabetes mellitus without complication, with long-term current use of insulin (HCC)  - POCT HgB A1C--6.6 today. Was 7.6--better control without hypoglycemia. Continue present meds.  3. Need for influenza vaccination  - Flu vaccine HIGH DOSE PF 4.Obesity  Miguel Aschoff MD [facility] 11/23/2014 11:12 AM

## 2014-12-12 ENCOUNTER — Other Ambulatory Visit: Payer: Self-pay | Admitting: Family Medicine

## 2015-01-19 ENCOUNTER — Other Ambulatory Visit: Payer: Self-pay | Admitting: Family Medicine

## 2015-02-23 ENCOUNTER — Other Ambulatory Visit: Payer: Self-pay | Admitting: Family Medicine

## 2015-02-24 ENCOUNTER — Other Ambulatory Visit: Payer: Self-pay | Admitting: Family Medicine

## 2015-02-28 ENCOUNTER — Other Ambulatory Visit: Payer: Self-pay | Admitting: Family Medicine

## 2015-03-23 ENCOUNTER — Ambulatory Visit (INDEPENDENT_AMBULATORY_CARE_PROVIDER_SITE_OTHER): Payer: Medicare Other | Admitting: Family Medicine

## 2015-03-23 ENCOUNTER — Encounter: Payer: Self-pay | Admitting: Family Medicine

## 2015-03-23 VITALS — BP 136/82 | HR 60 | Temp 98.0°F | Resp 16 | Ht 67.0 in | Wt 227.0 lb

## 2015-03-23 DIAGNOSIS — Z0000 Encounter for general adult medical examination without abnormal findings: Secondary | ICD-10-CM

## 2015-03-23 DIAGNOSIS — Z1211 Encounter for screening for malignant neoplasm of colon: Secondary | ICD-10-CM | POA: Diagnosis not present

## 2015-03-23 NOTE — Progress Notes (Signed)
Patient ID: Randy Dalton, male   DOB: 1927/08/22, 82 y.o.   MRN: [id number]  Visit Date: 03/23/2015  Today's Provider: [name], MD   Chief Complaint  Patient presents with  . Medicare Wellness   Subjective:   Randy Dalton is a 82 y.o. male who presents today for his Subsequent Annual Wellness Visit. He feels well. He reports exercising not a routine exercise but stays active. He reports he is sleeping well. Immunization History  Administered Date(s) Administered  . Influenza, High Dose Seasonal PF 11/23/2014  . Pneumococcal Conjugate-13 12/13/2013  . Pneumococcal Polysaccharide-23 06/08/2012  . Td 04/28/2003   Last colonoscopy-patient states he had one after 82 years of age and thinks Dr. Bary Dalton did it, will try to find records, do not have it in the system.    Review of Systems  Constitutional: Negative.   HENT: Negative.   Eyes: Negative.   Respiratory: Negative.   Cardiovascular: Negative.   Gastrointestinal: Negative.   Endocrine: Negative.   Genitourinary: Negative.   Musculoskeletal: Negative.   Skin: Negative.   Allergic/Immunologic: Negative.   Neurological: Negative.   Hematological: Negative.   Psychiatric/Behavioral: Negative.     Patient Active Problem List   Diagnosis Date Noted  . Atherosclerosis of renal artery ([place]) 05/17/2014  . ED (erectile dysfunction) of organic origin 05/17/2014  . Essential (primary) hypertension 05/17/2014  . HLD (hyperlipidemia) 05/17/2014  . Adult hypothyroidism 05/17/2014  . Adiposity 05/17/2014  . Arthritis, degenerative 05/17/2014  . Apnea, sleep 05/17/2014  . Diabetes mellitus, type 2 ([place]) 05/17/2014  . Avitaminosis D 05/17/2014    Social History   Social History  . Marital Status: Married    Spouse Name: N/A  . Number of Children: N/A  . Years of Education: N/A   Occupational History  . Not on file.   Social History Main Topics  . Smoking status: Never Smoker   . Smokeless tobacco: Never  Used  . Alcohol Use: No  . Drug Use: No  . Sexual Activity: Not Currently   Other Topics Concern  . Not on file   Social History Narrative    Past Surgical History  Procedure Laterality Date  . Hernia repair    . Back surgery    . Stomach surgery    . Colonoscopy    . Cataract extraction w/phaco Left 10/24/2014    Procedure: CATARACT EXTRACTION PHACO AND INTRAOCULAR LENS PLACEMENT (IOC);  Surgeon: Randy Freshwater, MD;  Location: [facility];  Service: Ophthalmology;  Laterality: Left;  DIABETIC - oral meds    His family history includes Colon cancer in his brother; Dementia in his sister and sister; Diabetes in his father; Hypertension in his mother and sister; Kidney failure in his mother; Lung cancer in his brother.    Outpatient Prescriptions Prior to Visit  Medication Sig Dispense Refill  . amLODipine (NORVASC) 5 MG tablet Take 5 mg by mouth daily.     . carvedilol (COREG) 12.5 MG tablet TAKE 1 TABLET BY MOUTH TWICE DAILY 60 tablet 5  . Cholecalciferol (VITAMIN D) 2000 UNITS tablet Take by mouth.    Marland Kitchen glimepiride (AMARYL) 4 MG tablet TAKE 1 TABLET BY MOUTH EVERY DAY 30 tablet 0  . hydrALAZINE (APRESOLINE) 50 MG tablet TAKE ONE TABLET BY MOUTH TWICE DAILY 60 tablet 0  . hydrochlorothiazide (HYDRODIURIL) 25 MG tablet TAKE ONE TABLET BY MOUTH EVERY DAY 30 tablet 11  . latanoprost (XALATAN) 0.005 % ophthalmic solution 1 drop at bedtime.    Marland Kitchen  levothyroxine (SYNTHROID, LEVOTHROID) 175 MCG tablet TAKE 1 TABLET BY MOUTH EVERY DAY 30 tablet 0  . linagliptin (TRADJENTA) 5 MG TABS tablet Take by mouth.    . losartan (COZAAR) 100 MG tablet TAKE ONE TABLET BY MOUTH EVERY DAY 30 tablet 11  . Multiple Vitamin (MULTIVITAMIN) tablet Take 1 tablet by mouth daily.    . pioglitazone (ACTOS) 15 MG tablet TAKE ONE TABLET BY MOUTH EVERY DAY 30 tablet 11  . sildenafil (VIAGRA) 100 MG tablet Take by mouth.    . simvastatin (ZOCOR) 20 MG tablet Take by mouth.     No  facility-administered medications prior to visit.    Allergies  Allergen Reactions  . Aspirin     Feel bad  . Codeine Nausea Only    Patient Care Team: Randy Dalton., MD as PCP - General (Family Medicine)  Objective:   Vitals:  Filed Vitals:   03/23/15 1016  BP: 136/82  Pulse: 60  Temp: 98 F (36.7 C)  Resp: 16  Height: 5\' 7"  (1.702 m)  Weight: 227 lb (102.967 kg)    Physical Exam  Constitutional: He is oriented to person, place, and time. He appears well-developed and well-nourished.  HENT:  Head: Normocephalic and atraumatic.  Right Ear: External ear normal.  Left Ear: External ear normal.  Eyes: Conjunctivae and EOM are normal. Pupils are equal, round, and reactive to light.  Left conjunctival hemorrhage present. Lower medial quarter.  Neck: Normal range of motion. Neck supple.  Cardiovascular: Normal rate, regular rhythm, normal heart sounds and intact distal pulses.   Pulmonary/Chest: Effort normal and breath sounds normal. No respiratory distress. He has no wheezes.  Abdominal: Soft. Bowel sounds are normal. There is no tenderness. There is no rebound.  Midline from xiphoid process to umbilicus there is often mass effect. Cannot discern a ventral hernia. Rectus diastasis versus ventral hernia.  Musculoskeletal: Normal range of motion. He exhibits no edema or tenderness.  Neurological: He is alert and oriented to person, place, and time. No cranial nerve deficit.  Skin: Skin is warm and dry. No rash noted. No erythema.  Psychiatric: He has a normal mood and affect. His behavior is normal. Judgment normal.    Activities of Daily Living In your present state of health, do you have any difficulty performing the following activities: 03/23/2015 11/23/2014  Hearing? N N  Vision? N N  Difficulty concentrating or making decisions? N N  Walking or climbing stairs? N N  Dressing or bathing? N N  Doing errands, shopping? N N    Fall Risk Assessment Fall Risk   03/23/2015 11/23/2014 07/18/2014  Falls in the past year? No No No     Depression Screen PHQ 2/9 Scores 03/23/2015 11/23/2014  PHQ - 2 Score 0 0    Cognitive Testing - 6-CIT    Year: 0 4 points  Month: 0 3 points  Memorize "Randy Dalton, Randy Dalton, [address], [place]"  Time (within 1 hour:) 0 3 points  Count backwards from 20: 0 2 4 points  Name months of year: 0 2 4 points  Repeat Address: 0 2 4 6 8 10  points   Total Score: 14/28  Interpretation : Normal (0-7) Abnormal (8-28)  Audit-C Alcohol Use Screening  Question Answer Points  How often do you have alcoholic drink? never 0  On days you do drink alcohol, how many drinks do you typically consume? none 0  How oftey will you drink 6 or more in a total? never  0  Total Score:  0   A score of 3 or more in women, and 4 or more in men indicates increased risk for alcohol abuse, EXCEPT if all of the points are from question 1.   Assessment & Plan:     Annual Wellness Visit  Reviewed patient's Family Medical History Reviewed and updated list of patient's medical providers Assessment of cognitive impairment was done Assessed patient's functional ability Established a written schedule for health screening [place] Completed and Reviewed  Colon cancer screening [facility] GU and DRE today.  Left conjunctival hemorrhage No visual disturbances so will use warm compresses for treatment.             Possible ventral hernia versus rectus diathesis             We'll follow this clinically and then refer to surgery if patient develops any symptoms. I have done the exam and reviewed the above chart and it is accurate to the best of my knowledge.  Miguel Aschoff MD [facility] 03/23/2015 10:17 AM  ------------------------------------------------------------------------------------------------------------

## 2015-03-24 ENCOUNTER — Other Ambulatory Visit: Payer: Self-pay | Admitting: Family Medicine

## 2015-04-07 ENCOUNTER — Other Ambulatory Visit: Payer: Self-pay | Admitting: Family Medicine

## 2015-04-28 ENCOUNTER — Other Ambulatory Visit: Payer: Self-pay | Admitting: Family Medicine

## 2015-05-06 ENCOUNTER — Other Ambulatory Visit: Payer: Self-pay | Admitting: Family Medicine

## 2015-05-31 DIAGNOSIS — H40053 Ocular hypertension, bilateral: Secondary | ICD-10-CM | POA: Diagnosis not present

## 2015-06-07 ENCOUNTER — Ambulatory Visit (INDEPENDENT_AMBULATORY_CARE_PROVIDER_SITE_OTHER): Payer: Medicare Other | Admitting: Family Medicine

## 2015-06-07 VITALS — BP 176/64 | HR 68 | Temp 97.7°F | Resp 16 | Wt 227.0 lb

## 2015-06-07 DIAGNOSIS — E7800 Pure hypercholesterolemia, unspecified: Secondary | ICD-10-CM

## 2015-06-07 DIAGNOSIS — Z794 Long term (current) use of insulin: Secondary | ICD-10-CM

## 2015-06-07 DIAGNOSIS — E039 Hypothyroidism, unspecified: Secondary | ICD-10-CM | POA: Diagnosis not present

## 2015-06-07 DIAGNOSIS — I10 Essential (primary) hypertension: Secondary | ICD-10-CM | POA: Diagnosis not present

## 2015-06-07 DIAGNOSIS — E119 Type 2 diabetes mellitus without complications: Secondary | ICD-10-CM | POA: Diagnosis not present

## 2015-06-07 LAB — POCT GLYCOSYLATED HEMOGLOBIN (HGB A1C): HEMOGLOBIN A1C: 7.5

## 2015-06-07 NOTE — Progress Notes (Signed)
Patient ID: Randy Dalton, male   DOB: 1927-05-10, 82 y.o.   MRN: [id number]    Subjective:  HPI  Patient is here for follow up. Last visit was in march for CPE.  Hypertension: patient checks his b/p sometimes but not sure of the readings, no cardiac symptoms present. BP Readings from Last 3 Encounters:  06/07/15 176/64  03/23/15 136/82  11/23/14 138/80   Diabetes: patient checks his sugar sometimes but not sure of the readings. Lab Results  Component Value Date   HGBA1C 6.6 11/23/2014   Last labs in July 2016  Prior to Admission medications   Medication Sig Start Date End Date Taking? Authorizing Provider  amLODipine (NORVASC) 5 MG tablet TAKE 1 TABLET BY MOUTH EVERY DAY 05/08/15  Yes Jerrol Banana., MD  carvedilol (COREG) 12.5 MG tablet TAKE 1 TABLET BY MOUTH TWICE DAILY 12/12/14  Yes Jerrol Banana., MD  Cholecalciferol (VITAMIN D) 2000 UNITS tablet Take by mouth. 11/08/10  Yes Historical Provider, MD  glimepiride (AMARYL) 4 MG tablet TAKE 1 TABLET BY MOUTH EVERY DAY 02/28/15  Yes Richard Maceo Pro., MD  hydrALAZINE (APRESOLINE) 50 MG tablet TAKE ONE TABLET BY MOUTH TWICE DAILY 03/25/15  Yes Jerrol Banana., MD  hydrochlorothiazide (HYDRODIURIL) 25 MG tablet TAKE ONE TABLET BY MOUTH EVERY DAY 07/25/14  Yes Richard Maceo Pro., MD  latanoprost (XALATAN) 0.005 % ophthalmic solution 1 drop at bedtime.   Yes Historical Provider, MD  levothyroxine (SYNTHROID, LEVOTHROID) 175 MCG tablet TAKE 1 TABLET BY MOUTH EVERY DAY 04/07/15  Yes Richard Maceo Pro., MD  losartan (COZAAR) 100 MG tablet TAKE ONE TABLET BY MOUTH EVERY DAY 07/09/14  Yes Jerrol Banana., MD  Multiple Vitamin (MULTIVITAMIN) tablet Take 1 tablet by mouth daily.   Yes Historical Provider, MD  pioglitazone (ACTOS) 15 MG tablet TAKE ONE TABLET BY MOUTH EVERY DAY 09/22/14  Yes Jerrol Banana., MD  sildenafil (VIAGRA) 100 MG tablet Take by mouth. 10/09/13  Yes Historical Provider, MD  simvastatin  (ZOCOR) 20 MG tablet TAKE ONE TABLET BY MOUTH AT BEDTIME. 05/08/15  Yes Richard Maceo Pro., MD    Patient Active Problem List   Diagnosis Date Noted  . Atherosclerosis of renal artery ([place]) 05/17/2014  . ED (erectile dysfunction) of organic origin 05/17/2014  . Essential (primary) hypertension 05/17/2014  . HLD (hyperlipidemia) 05/17/2014  . Adult hypothyroidism 05/17/2014  . Adiposity 05/17/2014  . Arthritis, degenerative 05/17/2014  . Apnea, sleep 05/17/2014  . Diabetes mellitus, type 2 ([place]) 05/17/2014  . Avitaminosis D 05/17/2014    Past Medical History  Diagnosis Date  . Hypertension   . Diabetes mellitus without complication ([place])   . Hypothyroidism   . Hypercholesteremia   . Diverticulosis   . Wears dentures     partial top    Social History   Social History  . Marital Status: Married    Spouse Name: N/A  . Number of Children: N/A  . Years of Education: N/A   Occupational History  . Not on file.   Social History Main Topics  . Smoking status: Never Smoker   . Smokeless tobacco: Never Used  . Alcohol Use: No  . Drug Use: No  . Sexual Activity: Not Currently   Other Topics Concern  . Not on file   Social History Narrative    Allergies  Allergen Reactions  . Aspirin     Feel bad  . Codeine Nausea Only  Review of Systems  Constitutional: Negative.   Respiratory: Negative.   Cardiovascular: Negative.   Musculoskeletal: Negative.   Neurological: Negative.   Psychiatric/Behavioral: Negative.     Immunization History  Administered Date(s) Administered  . Influenza, High Dose Seasonal PF 11/23/2014  . Pneumococcal Conjugate-13 12/13/2013  . Pneumococcal Polysaccharide-23 06/08/2012  . Td 04/28/2003   Objective:  BP 176/64 mmHg  Pulse 68  Temp(Src) 97.7 F (36.5 C)  Resp 16  Wt 227 lb (102.967 kg)  Physical Exam  Constitutional: He is oriented to person, place, and time and well-developed, well-nourished, and in no distress.  HENT:    Head: Normocephalic and atraumatic.  Right Ear: External ear normal.  Left Ear: External ear normal.  Nose: Nose normal.  Eyes: Conjunctivae are normal.  Neck: Neck supple.  Cardiovascular: Normal rate and regular rhythm.   Pulmonary/Chest: Effort normal and breath sounds normal.  Abdominal: Soft.  Genitourinary: Prostate normal and penis normal.  Musculoskeletal: He exhibits edema.  1+ lower extremity edema  Neurological: He is alert and oriented to person, place, and time. Gait normal.  Skin: Skin is warm and dry.  Psychiatric: Mood, memory, affect and judgment normal.    Lab Results  Component Value Date   WBC CANCELED 07/18/2014   HGB 13.5 11/06/2009   HCT CANCELED 07/18/2014   PLT CANCELED 07/18/2014   CHOL 173 07/18/2014   TRIG 193* 07/18/2014   HDL 31* 07/18/2014   LDLCALC 103* 07/18/2014   TSH 0.489 07/18/2014   PSA 5.4 06/30/2013   HGBA1C 6.6 11/23/2014    CMP     Component Value Date/Time   NA 283* 08/30/2013   K 4.6 08/30/2013   BUN 27* 08/30/2013   CREATININE 1.5* 08/30/2013   AST 13* 08/30/2013   ALT 16 08/30/2013   ALKPHOS 67 08/30/2013    Assessment and Plan :   1. Type 2 diabetes mellitus without complication, with long-term current use of insulin (HCC)   - POCT HgB A1C--7.5 today.  2. Essential (primary) hypertension  - CBC with Differential/Platelet - Comprehensive metabolic panel  3. Hypothyroidism, unspecified hypothyroidism type  - TSH  4. Hypercholesterolemia  - Lipid Panel With LDL/HDL Ratio 5. Obesity I have done the exam and reviewed the above chart and it is accurate to the best of my knowledge.  Miguel Aschoff MD [facility] 06/07/2015 9:37 AM

## 2015-06-08 LAB — COMPREHENSIVE METABOLIC PANEL
A/G RATIO: 1.7 (ref 1.2–2.2)
ALT: 16 IU/L (ref 0–44)
AST: 17 IU/L (ref 0–40)
Albumin: 4.4 g/dL (ref 3.5–4.7)
Alkaline Phosphatase: 67 IU/L (ref 39–117)
BUN / CREAT RATIO: 21 (ref 10–24)
BUN: 36 mg/dL — AB (ref 8–27)
Bilirubin Total: 0.5 mg/dL (ref 0.0–1.2)
CALCIUM: 9.9 mg/dL (ref 8.6–10.2)
CO2: 23 mmol/L (ref 18–29)
CREATININE: 1.75 mg/dL — AB (ref 0.76–1.27)
Chloride: 101 mmol/L (ref 96–106)
GFR calc Af Amer: 40 mL/min/{1.73_m2} — ABNORMAL LOW (ref >59)
GFR, EST NON AFRICAN AMERICAN: 34 mL/min/{1.73_m2} — AB (ref >59)
GLUCOSE: 175 mg/dL — AB (ref 65–99)
Globulin, Total: 2.6 g/dL (ref 1.5–4.5)
POTASSIUM: 4.7 mmol/L (ref 3.5–5.2)
SODIUM: 141 mmol/L (ref 134–144)
TOTAL PROTEIN: 7 g/dL (ref 6.0–8.5)

## 2015-06-08 LAB — CBC WITH DIFFERENTIAL/PLATELET
BASOS ABS: 0.1 10*3/uL (ref 0.0–0.2)
Basos: 1 %
EOS (ABSOLUTE): 0.5 10*3/uL — AB (ref 0.0–0.4)
Eos: 6 %
Hematocrit: 43.7 % (ref 37.5–51.0)
Hemoglobin: 14.5 g/dL (ref 12.6–17.7)
IMMATURE GRANS (ABS): 0 10*3/uL (ref 0.0–0.1)
IMMATURE GRANULOCYTES: 0 %
LYMPHS: 20 %
Lymphocytes Absolute: 1.7 10*3/uL (ref 0.7–3.1)
MCH: 31.6 pg (ref 26.6–33.0)
MCHC: 33.2 g/dL (ref 31.5–35.7)
MCV: 95 fL (ref 79–97)
MONOS ABS: 0.7 10*3/uL (ref 0.1–0.9)
Monocytes: 8 %
NEUTROS PCT: 65 %
Neutrophils Absolute: 5.6 10*3/uL (ref 1.4–7.0)
PLATELETS: 154 10*3/uL (ref 150–379)
RBC: 4.59 x10E6/uL (ref 4.14–5.80)
RDW: 13.9 % (ref 12.3–15.4)
WBC: 8.6 10*3/uL (ref 3.4–10.8)

## 2015-06-08 LAB — LIPID PANEL WITH LDL/HDL RATIO
Cholesterol, Total: 172 mg/dL (ref 100–199)
HDL: 31 mg/dL — ABNORMAL LOW (ref >39)
LDL CALC: 98 mg/dL (ref 0–99)
LDL/HDL RATIO: 3.2 ratio (ref 0.0–3.6)
Triglycerides: 217 mg/dL — ABNORMAL HIGH (ref 0–149)
VLDL CHOLESTEROL CAL: 43 mg/dL — AB (ref 5–40)

## 2015-06-08 LAB — TSH: TSH: 3.57 u[IU]/mL (ref 0.450–4.500)

## 2015-06-09 ENCOUNTER — Telehealth: Payer: Self-pay

## 2015-06-09 DIAGNOSIS — N289 Disorder of kidney and ureter, unspecified: Secondary | ICD-10-CM

## 2015-06-09 NOTE — Telephone Encounter (Signed)
-----   Message from Jerrol Banana., MD sent at 06/09/2015  7:56 AM EDT ----- Labs stable, kidney function slightly worse. Consider referral to nephrology if patient has not seen them in the past.

## 2015-06-09 NOTE — Telephone Encounter (Signed)
Tried calling patient, and no answer. Will try again later.  

## 2015-06-13 NOTE — Telephone Encounter (Signed)
Pt advised and referral put in-aa

## 2015-07-13 ENCOUNTER — Other Ambulatory Visit: Payer: Self-pay | Admitting: Family Medicine

## 2015-07-25 ENCOUNTER — Other Ambulatory Visit: Payer: Self-pay | Admitting: Family Medicine

## 2015-07-28 DIAGNOSIS — E785 Hyperlipidemia, unspecified: Secondary | ICD-10-CM | POA: Diagnosis not present

## 2015-07-28 DIAGNOSIS — N183 Chronic kidney disease, stage 3 (moderate): Secondary | ICD-10-CM | POA: Diagnosis not present

## 2015-07-28 DIAGNOSIS — I129 Hypertensive chronic kidney disease with stage 1 through stage 4 chronic kidney disease, or unspecified chronic kidney disease: Secondary | ICD-10-CM | POA: Diagnosis not present

## 2015-07-28 DIAGNOSIS — R809 Proteinuria, unspecified: Secondary | ICD-10-CM | POA: Diagnosis not present

## 2015-07-28 DIAGNOSIS — E1122 Type 2 diabetes mellitus with diabetic chronic kidney disease: Secondary | ICD-10-CM | POA: Diagnosis not present

## 2015-08-01 DIAGNOSIS — N183 Chronic kidney disease, stage 3 (moderate): Secondary | ICD-10-CM | POA: Diagnosis not present

## 2015-08-12 ENCOUNTER — Other Ambulatory Visit: Payer: Self-pay | Admitting: Family Medicine

## 2015-09-12 DIAGNOSIS — R809 Proteinuria, unspecified: Secondary | ICD-10-CM | POA: Diagnosis not present

## 2015-09-12 DIAGNOSIS — E1122 Type 2 diabetes mellitus with diabetic chronic kidney disease: Secondary | ICD-10-CM | POA: Diagnosis not present

## 2015-09-12 DIAGNOSIS — N183 Chronic kidney disease, stage 3 (moderate): Secondary | ICD-10-CM | POA: Diagnosis not present

## 2015-09-12 DIAGNOSIS — I129 Hypertensive chronic kidney disease with stage 1 through stage 4 chronic kidney disease, or unspecified chronic kidney disease: Secondary | ICD-10-CM | POA: Diagnosis not present

## 2015-10-16 ENCOUNTER — Other Ambulatory Visit: Payer: Self-pay | Admitting: Family Medicine

## 2015-10-23 ENCOUNTER — Ambulatory Visit (INDEPENDENT_AMBULATORY_CARE_PROVIDER_SITE_OTHER): Payer: Medicare Other | Admitting: Family Medicine

## 2015-10-23 ENCOUNTER — Encounter: Payer: Self-pay | Admitting: Family Medicine

## 2015-10-23 VITALS — BP 158/82 | HR 76 | Temp 97.5°F | Resp 16 | Wt 223.0 lb

## 2015-10-23 DIAGNOSIS — E119 Type 2 diabetes mellitus without complications: Secondary | ICD-10-CM | POA: Diagnosis not present

## 2015-10-23 DIAGNOSIS — E7800 Pure hypercholesterolemia, unspecified: Secondary | ICD-10-CM

## 2015-10-23 DIAGNOSIS — N183 Chronic kidney disease, stage 3 unspecified: Secondary | ICD-10-CM

## 2015-10-23 DIAGNOSIS — I10 Essential (primary) hypertension: Secondary | ICD-10-CM

## 2015-10-23 DIAGNOSIS — Z794 Long term (current) use of insulin: Secondary | ICD-10-CM

## 2015-10-23 DIAGNOSIS — Z23 Encounter for immunization: Secondary | ICD-10-CM

## 2015-10-23 LAB — POCT UA - MICROALBUMIN: Microalbumin Ur, POC: 100 mg/L

## 2015-10-23 LAB — POCT GLYCOSYLATED HEMOGLOBIN (HGB A1C): HEMOGLOBIN A1C: 6.5

## 2015-10-23 NOTE — Progress Notes (Signed)
Randy Dalton  MRN: [id number] DOB: Mar 05, 1927  Subjective:  HPI  Patient is here for follow up  Diabetes: patient checks his sugar sometimes but does not recall the readings. No tingling or numbness sensation present. Lab Results  Component Value Date   HGBA1C 7.5 06/07/2015   Hypertension: check his b/p sometimes but does not recall readings. No cardiac symptoms present. BP Readings from Last 3 Encounters:  10/23/15 (!) 158/82  06/07/15 (!) 176/64  03/23/15 136/82   Routine labs were done on 06/07/15 and labs were stable except kidney function was worse and referral to nephrologist was recommended. He did see nephrologist and he had his kidney function on the recheck improved, his medications were not changed.  Patient Active Problem List   Diagnosis Date Noted  . Atherosclerosis of renal artery ([place]) 05/17/2014  . ED (erectile dysfunction) of organic origin 05/17/2014  . Essential (primary) hypertension 05/17/2014  . HLD (hyperlipidemia) 05/17/2014  . Adult hypothyroidism 05/17/2014  . Adiposity 05/17/2014  . Arthritis, degenerative 05/17/2014  . Apnea, sleep 05/17/2014  . Diabetes mellitus, type 2 ([place]) 05/17/2014  . Avitaminosis D 05/17/2014    Past Medical History:  Diagnosis Date  . Diabetes mellitus without complication ([place])   . Diverticulosis   . Hypercholesteremia   . Hypertension   . Hypothyroidism   . Wears dentures    partial top    Social History   Social History  . Marital status: Married    Spouse name: N/A  . Number of children: N/A  . Years of education: N/A   Occupational History  . Not on file.   Social History Main Topics  . Smoking status: Never Smoker  . Smokeless tobacco: Never Used  . Alcohol use No  . Drug use: No  . Sexual activity: Not Currently   Other Topics Concern  . Not on file   Social History Narrative  . No narrative on file    Outpatient Encounter Prescriptions as of 10/23/2015  Medication Sig Note  .  amLODipine (NORVASC) 5 MG tablet TAKE 1 TABLET BY MOUTH EVERY DAY   . carvedilol (COREG) 12.5 MG tablet TAKE 1 TABLET BY MOUTH TWICE DAILY   . Cholecalciferol (VITAMIN D) 2000 UNITS tablet Take by mouth. 05/17/2014: Received from: [facility]  . glimepiride (AMARYL) 4 MG tablet TAKE 1 TABLET BY MOUTH EVERY DAY   . hydrALAZINE (APRESOLINE) 50 MG tablet TAKE ONE TABLET BY MOUTH TWICE DAILY   . hydrochlorothiazide (HYDRODIURIL) 25 MG tablet TAKE 1 TABLET BY MOUTH EVERY DAY   . latanoprost (XALATAN) 0.005 % ophthalmic solution 1 drop at bedtime.   Marland Kitchen levothyroxine (SYNTHROID, LEVOTHROID) 175 MCG tablet TAKE 1 TABLET BY MOUTH EVERY DAY   . losartan (COZAAR) 100 MG tablet TAKE 1 TABLET BY MOUTH EVERY DAY   . Multiple Vitamin (MULTIVITAMIN) tablet Take 1 tablet by mouth daily.   . pioglitazone (ACTOS) 15 MG tablet TAKE 1 TABLET BY MOUTH EVERY DAY   . sildenafil (VIAGRA) 100 MG tablet Take by mouth. 05/17/2014: Medication taken as needed.  Received from: [facility]  . simvastatin (ZOCOR) 20 MG tablet TAKE ONE TABLET BY MOUTH AT BEDTIME.    No facility-administered encounter medications on file as of 10/23/2015.     Allergies  Allergen Reactions  . Aspirin     Feel bad  . Codeine Nausea Only    Review of Systems  Constitutional: Negative.   Respiratory: Negative.   Cardiovascular: Negative.   Gastrointestinal:  Negative.   Musculoskeletal: Negative.   Neurological: Negative.   Endo/Heme/Allergies: Negative.   Psychiatric/Behavioral: Negative.    Objective:  BP (!) 158/82   Pulse 76   Temp 97.5 F (36.4 C)   Resp 16   Wt 223 lb (101.2 kg)   BMI 34.93 kg/m   Physical Exam  Constitutional: He is oriented to person, place, and time and well-developed, well-nourished, and in no distress.  HENT:  Head: Normocephalic and atraumatic.  Right Ear: External ear normal.  Left Ear: External ear normal.  Nose: Nose normal.  Eyes: Conjunctivae are normal.  Pupils are equal, round, and reactive to light.  Neck: Normal range of motion. Neck supple.  Cardiovascular: Normal rate, regular rhythm, normal heart sounds and intact distal pulses.   No murmur heard. Pulmonary/Chest: Effort normal and breath sounds normal. No respiratory distress. He has no wheezes.  Abdominal: Soft.  Musculoskeletal: He exhibits no edema or tenderness.  Neurological: He is alert and oriented to person, place, and time.  Skin: Skin is warm and dry.  Psychiatric: Mood, memory, affect and judgment normal.    Assessment and Plan :  1. Type 2 diabetes mellitus without complication, with long-term current use of insulin (HCC) A1C 6.5, better. Continue current medication. - POCT HgB A1C - POCT UA - Microalbumin  2. Essential (primary) hypertension  3. Hypercholesterolemia  4. Need for influenza vaccination - Flu vaccine HIGH DOSE PF (Fluzone High dose)  5. CKD III./Diabetic nephropathy Will re check Renal today. Microalbumin is 100 but patient is on losartan  HPI, Exam and A&P transcribed under direction and in the presence of Miguel Aschoff, MD. I have done the exam and reviewed the chart and it is accurate to the best of my knowledge. Miguel Aschoff M.D. [facility]

## 2015-10-24 ENCOUNTER — Telehealth: Payer: Self-pay

## 2015-10-24 LAB — RENAL FUNCTION PANEL
Albumin: 4.3 g/dL (ref 3.5–4.7)
BUN / CREAT RATIO: 18 (ref 10–24)
BUN: 29 mg/dL — ABNORMAL HIGH (ref 8–27)
CALCIUM: 9.9 mg/dL (ref 8.6–10.2)
CO2: 22 mmol/L (ref 18–29)
CREATININE: 1.59 mg/dL — AB (ref 0.76–1.27)
Chloride: 102 mmol/L (ref 96–106)
GFR calc Af Amer: 45 mL/min/{1.73_m2} — ABNORMAL LOW (ref >59)
GFR calc non Af Amer: 39 mL/min/{1.73_m2} — ABNORMAL LOW (ref >59)
GLUCOSE: 152 mg/dL — AB (ref 65–99)
PHOSPHORUS: 3.1 mg/dL (ref 2.5–4.5)
POTASSIUM: 4.4 mmol/L (ref 3.5–5.2)
SODIUM: 141 mmol/L (ref 134–144)

## 2015-10-24 NOTE — Telephone Encounter (Signed)
-----   Message from Jerrol Banana., MD sent at 10/24/2015  3:24 PM EDT ----- Kidney function stable , maybe a little better.

## 2015-10-24 NOTE — Telephone Encounter (Signed)
Tried calling patient, and no answer. Will try again later.  

## 2015-10-30 NOTE — Telephone Encounter (Signed)
Pt advised-aa 

## 2015-12-05 ENCOUNTER — Other Ambulatory Visit: Payer: Self-pay | Admitting: Family Medicine

## 2015-12-22 DIAGNOSIS — R809 Proteinuria, unspecified: Secondary | ICD-10-CM | POA: Diagnosis not present

## 2015-12-22 DIAGNOSIS — E1122 Type 2 diabetes mellitus with diabetic chronic kidney disease: Secondary | ICD-10-CM | POA: Diagnosis not present

## 2015-12-22 DIAGNOSIS — I129 Hypertensive chronic kidney disease with stage 1 through stage 4 chronic kidney disease, or unspecified chronic kidney disease: Secondary | ICD-10-CM | POA: Diagnosis not present

## 2015-12-22 DIAGNOSIS — N183 Chronic kidney disease, stage 3 (moderate): Secondary | ICD-10-CM | POA: Diagnosis not present

## 2016-01-16 DIAGNOSIS — M272 Inflammatory conditions of jaws: Secondary | ICD-10-CM | POA: Diagnosis not present

## 2016-01-31 DIAGNOSIS — M272 Inflammatory conditions of jaws: Secondary | ICD-10-CM | POA: Diagnosis not present

## 2016-03-04 ENCOUNTER — Ambulatory Visit: Payer: Medicare Other | Admitting: Family Medicine

## 2016-03-14 ENCOUNTER — Encounter: Payer: Self-pay | Admitting: Family Medicine

## 2016-03-14 ENCOUNTER — Ambulatory Visit (INDEPENDENT_AMBULATORY_CARE_PROVIDER_SITE_OTHER): Payer: Medicare Other | Admitting: Family Medicine

## 2016-03-14 VITALS — BP 138/82 | HR 64 | Temp 97.6°F | Resp 16 | Wt 224.0 lb

## 2016-03-14 DIAGNOSIS — E119 Type 2 diabetes mellitus without complications: Secondary | ICD-10-CM

## 2016-03-14 DIAGNOSIS — E7800 Pure hypercholesterolemia, unspecified: Secondary | ICD-10-CM | POA: Diagnosis not present

## 2016-03-14 DIAGNOSIS — I10 Essential (primary) hypertension: Secondary | ICD-10-CM

## 2016-03-14 DIAGNOSIS — Z794 Long term (current) use of insulin: Secondary | ICD-10-CM

## 2016-03-14 DIAGNOSIS — E039 Hypothyroidism, unspecified: Secondary | ICD-10-CM | POA: Diagnosis not present

## 2016-03-14 LAB — POCT GLYCOSYLATED HEMOGLOBIN (HGB A1C): Hemoglobin A1C: 6.9

## 2016-03-14 NOTE — Progress Notes (Signed)
Subjective:  HPI  Diabetes Mellitus Type II, Follow-up:   Lab Results  Component Value Date   HGBA1C 6.5 10/23/2015   HGBA1C 7.5 06/07/2015   HGBA1C 6.6 11/23/2014    Last seen for diabetes 4 months ago.  Management since then includes none. He reports good compliance with treatment. He is not having side effects.  Home blood sugar records: he is checking them, but does not remember the numbers  Episodes of hypoglycemia? no    Pertinent Labs:    Component Value Date/Time   CHOL 172 06/07/2015 1009   TRIG 217 (H) 06/07/2015 1009   HDL 31 (L) 06/07/2015 1009   LDLCALC 98 06/07/2015 1009   CREATININE 1.59 (H) 10/23/2015 1105    Wt Readings from Last 3 Encounters:  03/14/16 224 lb (101.6 kg)  10/23/15 223 lb (101.2 kg)  06/07/15 227 lb (103 kg)    ------------------------------------------------------------------------   Hypertension, follow-up:  BP Readings from Last 3 Encounters:  03/14/16 138/82  10/23/15 (!) 158/82  06/07/15 (!) 176/64    He was last seen for hypertension 3 months ago.  BP at that visit was 158/82. Management since that visit includes none. He reports good compliance with treatment. He is not having side effects.  Outside blood pressures are  Running "good". He is experiencing none.  Patient denies chest pain, chest pressure/discomfort, claudication, dyspnea, exertional chest pressure/discomfort, fatigue, irregular heart beat, lower extremity edema, near-syncope, orthopnea, palpitations, paroxysmal nocturnal dyspnea, syncope and tachypnea.   Cardiovascular risk factors include advanced age (older than 35 for men, 73 for women), diabetes mellitus, dyslipidemia, hypertension, male gender and microalbuminuria.   Wt Readings from Last 3 Encounters:  03/14/16 224 lb (101.6 kg)  10/23/15 223 lb (101.2 kg)  06/07/15 227 lb (103 kg)   ------------------------------------------------------------------------    Prior to Admission  medications   Medication Sig Start Date End Date Taking? Authorizing Provider  amLODipine (NORVASC) 5 MG tablet TAKE 1 TABLET BY MOUTH EVERY DAY 05/08/15   Jerrol Banana., MD  carvedilol (COREG) 12.5 MG tablet TAKE 1 TABLET BY MOUTH TWICE DAILY 07/13/15   Jerrol Banana., MD  Cholecalciferol (VITAMIN D) 2000 UNITS tablet Take by mouth. 11/08/10   Historical Provider, MD  glimepiride (AMARYL) 4 MG tablet TAKE 1 TABLET BY MOUTH EVERY DAY 12/06/15   Jerrol Banana., MD  hydrALAZINE (APRESOLINE) 50 MG tablet TAKE ONE TABLET BY MOUTH TWICE DAILY 10/16/15   Jerrol Banana., MD  hydrochlorothiazide (HYDRODIURIL) 25 MG tablet TAKE 1 TABLET BY MOUTH EVERY DAY 08/12/15   Jerrol Banana., MD  latanoprost (XALATAN) 0.005 % ophthalmic solution 1 drop at bedtime.    Historical Provider, MD  levothyroxine (SYNTHROID, LEVOTHROID) 175 MCG tablet TAKE 1 TABLET BY MOUTH EVERY DAY 04/07/15   Jerrol Banana., MD  losartan (COZAAR) 100 MG tablet TAKE 1 TABLET BY MOUTH EVERY DAY 07/25/15   Jerrol Banana., MD  Multiple Vitamin (MULTIVITAMIN) tablet Take 1 tablet by mouth daily.    Historical Provider, MD  pioglitazone (ACTOS) 15 MG tablet TAKE 1 TABLET BY MOUTH EVERY DAY 10/16/15   Jerrol Banana., MD  sildenafil (VIAGRA) 100 MG tablet Take by mouth. 10/09/13   Historical Provider, MD  simvastatin (ZOCOR) 20 MG tablet TAKE ONE TABLET BY MOUTH AT BEDTIME. 05/08/15   Kiyani Jernigan Maceo Pro., MD    Patient Active Problem List   Diagnosis Date Noted  . Atherosclerosis of renal  artery ([place]) 05/17/2014  . ED (erectile dysfunction) of organic origin 05/17/2014  . Essential (primary) hypertension 05/17/2014  . HLD (hyperlipidemia) 05/17/2014  . Adult hypothyroidism 05/17/2014  . Adiposity 05/17/2014  . Arthritis, degenerative 05/17/2014  . Apnea, sleep 05/17/2014  . Diabetes mellitus, type 2 ([place]) 05/17/2014  . Avitaminosis D 05/17/2014    Past Medical History:  Diagnosis Date  .  Diabetes mellitus without complication ([place])   . Diverticulosis   . Hypercholesteremia   . Hypertension   . Hypothyroidism   . Wears dentures    partial top    Social History   Social History  . Marital status: Married    Spouse name: N/A  . Number of children: N/A  . Years of education: N/A   Occupational History  . Not on file.   Social History Main Topics  . Smoking status: Never Smoker  . Smokeless tobacco: Never Used  . Alcohol use No  . Drug use: No  . Sexual activity: Not Currently   Other Topics Concern  . Not on file   Social History Narrative  . No narrative on file    Allergies  Allergen Reactions  . Aspirin     Feel bad  . Codeine Nausea Only    Review of Systems  Constitutional: Negative.   HENT: Negative.   Eyes: Negative.   Respiratory: Negative.   Cardiovascular: Negative.   Gastrointestinal: Negative.   Genitourinary: Negative.   Musculoskeletal: Negative.   Skin: Negative.   Neurological: Negative.   Endo/Heme/Allergies: Negative.   Psychiatric/Behavioral: Negative.     Immunization History  Administered Date(s) Administered  . Influenza, High Dose Seasonal PF 11/23/2014, 10/23/2015  . Pneumococcal Conjugate-13 12/13/2013  . Pneumococcal Polysaccharide-23 06/08/2012  . Td 04/28/2003    Objective:  BP 138/82 (BP Location: Left Arm, Patient Position: Sitting, Cuff Size: Large)   Pulse 64   Temp 97.6 F (36.4 C) (Oral)   Resp 16   Wt 224 lb (101.6 kg)   BMI 35.08 kg/m   Physical Exam  Constitutional: He is oriented to person, place, and time and well-developed, well-nourished, and in no distress.  HENT:  Head: Normocephalic and atraumatic.  Right Ear: External ear normal.  Left Ear: External ear normal.  Nose: Nose normal.  Eyes: Conjunctivae and EOM are normal. Pupils are equal, round, and reactive to light.  Neck: Normal range of motion. Neck supple. No thyromegaly present.  Cardiovascular: Normal rate, regular rhythm,  normal heart sounds and intact distal pulses.   Pulmonary/Chest: Effort normal and breath sounds normal.  Abdominal: Soft.  Musculoskeletal: Normal range of motion.  Neurological: He is alert and oriented to person, place, and time. He has normal reflexes. Gait normal. GCS score is 15.  Skin: Skin is warm and dry.  Psychiatric: Mood, memory, affect and judgment normal.    Lab Results  Component Value Date   WBC 8.6 06/07/2015   HGB 13.5 11/06/2009   HCT 43.7 06/07/2015   PLT 154 06/07/2015   GLUCOSE 152 (H) 10/23/2015   CHOL 172 06/07/2015   TRIG 217 (H) 06/07/2015   HDL 31 (L) 06/07/2015   LDLCALC 98 06/07/2015   TSH 3.570 06/07/2015   PSA 5.4 06/30/2013   HGBA1C 6.5 10/23/2015   MICROALBUR 100 10/23/2015    CMP     Component Value Date/Time   NA 141 10/23/2015 1105   K 4.4 10/23/2015 1105   CL 102 10/23/2015 1105   CO2 22 10/23/2015 1105   GLUCOSE  152 (H) 10/23/2015 1105   BUN 29 (H) 10/23/2015 1105   CREATININE 1.59 (H) 10/23/2015 1105   CALCIUM 9.9 10/23/2015 1105   PROT 7.0 06/07/2015 1009   ALBUMIN 4.3 10/23/2015 1105   AST 17 06/07/2015 1009   ALT 16 06/07/2015 1009   ALKPHOS 67 06/07/2015 1009   BILITOT 0.5 06/07/2015 1009   GFRNONAA 39 (L) 10/23/2015 1105   GFRAA 45 (L) 10/23/2015 1105    Assessment and Plan :  1. Type 2 diabetes mellitus without complication, with long-term current use of insulin (HCC)  - POCT HgB A1C 5.9 today. Stable. Follow up in 4 months.   2. Essential (primary) hypertension  - CBC with Differential/Platelet  3. Pure hypercholesterolemia  - Lipid Panel With LDL/HDL Ratio - Comprehensive metabolic panel  4. Adult hypothyroidism  - TSH 5.Obesity with comorbidity of DM and HTN/HLD   HPI, Exam, and A&P Transcribed under the direction and in the presence of Myanna Ziesmer L. Cranford Mon, MD  Electronically Signed: Katina Dung, [place] MD [facility] 03/14/2016 11:27  AM

## 2016-03-15 LAB — COMPREHENSIVE METABOLIC PANEL
ALK PHOS: 66 IU/L (ref 39–117)
ALT: 15 IU/L (ref 0–44)
AST: 20 IU/L (ref 0–40)
Albumin/Globulin Ratio: 1.6 (ref 1.2–2.2)
Albumin: 4.5 g/dL (ref 3.5–4.7)
BUN/Creatinine Ratio: 17 (ref 10–24)
BUN: 27 mg/dL (ref 8–27)
Bilirubin Total: 0.6 mg/dL (ref 0.0–1.2)
CALCIUM: 10.1 mg/dL (ref 8.6–10.2)
CO2: 25 mmol/L (ref 18–29)
CREATININE: 1.55 mg/dL — AB (ref 0.76–1.27)
Chloride: 101 mmol/L (ref 96–106)
GFR calc Af Amer: 46 mL/min/{1.73_m2} — ABNORMAL LOW (ref >59)
GFR, EST NON AFRICAN AMERICAN: 40 mL/min/{1.73_m2} — AB (ref >59)
GLOBULIN, TOTAL: 2.8 g/dL (ref 1.5–4.5)
GLUCOSE: 140 mg/dL — AB (ref 65–99)
Potassium: 4.7 mmol/L (ref 3.5–5.2)
Sodium: 141 mmol/L (ref 134–144)
Total Protein: 7.3 g/dL (ref 6.0–8.5)

## 2016-03-15 LAB — CBC WITH DIFFERENTIAL/PLATELET
BASOS: 1 %
Basophils Absolute: 0.1 10*3/uL (ref 0.0–0.2)
EOS (ABSOLUTE): 0.3 10*3/uL (ref 0.0–0.4)
EOS: 3 %
HEMATOCRIT: 43.2 % (ref 37.5–51.0)
HEMOGLOBIN: 13.9 g/dL (ref 13.0–17.7)
Immature Grans (Abs): 0 10*3/uL (ref 0.0–0.1)
Immature Granulocytes: 0 %
LYMPHS ABS: 2.1 10*3/uL (ref 0.7–3.1)
Lymphs: 23 %
MCH: 29.9 pg (ref 26.6–33.0)
MCHC: 32.2 g/dL (ref 31.5–35.7)
MCV: 93 fL (ref 79–97)
MONOCYTES: 9 %
MONOS ABS: 0.9 10*3/uL (ref 0.1–0.9)
NEUTROS ABS: 6 10*3/uL (ref 1.4–7.0)
Neutrophils: 64 %
Platelets: 160 10*3/uL (ref 150–379)
RBC: 4.65 x10E6/uL (ref 4.14–5.80)
RDW: 14.7 % (ref 12.3–15.4)
WBC: 9.3 10*3/uL (ref 3.4–10.8)

## 2016-03-15 LAB — LIPID PANEL WITH LDL/HDL RATIO
Cholesterol, Total: 163 mg/dL (ref 100–199)
HDL: 42 mg/dL (ref >39)
LDL CALC: 93 mg/dL (ref 0–99)
LDL/HDL RATIO: 2.2 ratio (ref 0.0–3.6)
TRIGLYCERIDES: 139 mg/dL (ref 0–149)
VLDL Cholesterol Cal: 28 mg/dL (ref 5–40)

## 2016-03-15 LAB — TSH: TSH: 3.25 u[IU]/mL (ref 0.450–4.500)

## 2016-03-18 ENCOUNTER — Telehealth: Payer: Self-pay

## 2016-03-18 NOTE — Telephone Encounter (Signed)
Pt advised.   Thanks,   -Laura  

## 2016-03-18 NOTE — Telephone Encounter (Signed)
-----   Message from Jerrol Banana., MD sent at 03/18/2016  9:23 AM EDT ----- Stable.

## 2016-05-01 ENCOUNTER — Other Ambulatory Visit: Payer: Self-pay | Admitting: Family Medicine

## 2016-05-09 ENCOUNTER — Other Ambulatory Visit: Payer: Self-pay | Admitting: Family Medicine

## 2016-05-15 ENCOUNTER — Ambulatory Visit (INDEPENDENT_AMBULATORY_CARE_PROVIDER_SITE_OTHER): Payer: Medicare Other

## 2016-05-15 ENCOUNTER — Ambulatory Visit (INDEPENDENT_AMBULATORY_CARE_PROVIDER_SITE_OTHER): Payer: Medicare Other | Admitting: Family Medicine

## 2016-05-15 ENCOUNTER — Encounter: Payer: Self-pay | Admitting: Family Medicine

## 2016-05-15 VITALS — BP 210/98 | HR 68 | Temp 98.0°F | Ht 67.0 in | Wt 229.0 lb

## 2016-05-15 VITALS — BP 218/100 | HR 68 | Temp 98.0°F | Wt 229.0 lb

## 2016-05-15 DIAGNOSIS — I10 Essential (primary) hypertension: Secondary | ICD-10-CM | POA: Diagnosis not present

## 2016-05-15 DIAGNOSIS — Z0000 Encounter for general adult medical examination without abnormal findings: Secondary | ICD-10-CM

## 2016-05-15 MED ORDER — AMLODIPINE BESYLATE 10 MG PO TABS: 10.0000 mg | ORAL_TABLET | Freq: Every day | ORAL | 11 refills | Status: DC

## 2016-05-15 NOTE — Patient Instructions (Signed)
Randy Dalton , Thank you for taking time to come for your Medicare Wellness Visit. I appreciate your ongoing commitment to your health goals. Please review the following plan we discussed and let me know if I can assist you in the future.   Screening recommendations/referrals: Colonoscopy: completed 03/14/1994 Recommended yearly ophthalmology/optometry visit for glaucoma screening and checkup Recommended yearly dental visit for hygiene and checkup  Vaccinations: Influenza vaccine: up to date, due 09/2016 Pneumococcal vaccine: series completed Tdap vaccine: completed 04/28/03, pt declined today Shingles vaccine: declined  Advanced directives: Discussed at [place] today. Pt declines at this time. Pt advised to contact office when ready to set up. The patient verbalizes understanding.  Conditions/risks identified: Obesity; Recommend increasing water intake to 4-6 glasses a day.  Next appointment: 07/12/16 @ 10:15 AM  Preventive Care 81 Years and Older, Male Preventive care refers to lifestyle choices and visits with your health care provider that can promote health and wellness. What does preventive care include?  A yearly physical exam. This is also called an annual well check.  Dental exams once or twice a year.  Routine eye exams. Ask your health care provider how often you should have your eyes checked.  Personal lifestyle choices, including:  Daily care of your teeth and gums.  Regular physical activity.  Eating a healthy diet.  Avoiding tobacco and drug use.  Limiting alcohol use.  Practicing safe sex.  Taking low doses of aspirin every day.  Taking vitamin and mineral supplements as recommended by your health care provider. What happens during an annual well check? The services and screenings done by your health care provider during your annual well check will depend on your age, overall health, lifestyle risk factors, and family history of disease. Counseling  Your health  care provider may ask you questions about your:  Alcohol use.  Tobacco use.  Drug use.  Emotional well-being.  Home and relationship well-being.  Sexual activity.  Eating habits.  History of falls.  Memory and ability to understand (cognition).  Work and work Statistician. Screening  You may have the following tests or measurements:  Height, weight, and BMI.  Blood pressure.  Lipid and cholesterol levels. These may be checked every 5 years, or more frequently if you are over 28 years old.  Skin check.  Lung cancer screening. You may have this screening every year starting at age 49 if you have a 30-pack-year history of smoking and currently smoke or have quit within the past 15 years.  Fecal occult blood test (FOBT) of the stool. You may have this test every year starting at age 39.  Flexible sigmoidoscopy or colonoscopy. You may have a sigmoidoscopy every 5 years or a colonoscopy every 10 years starting at age 58.  Prostate cancer screening. Recommendations will vary depending on your family history and other risks.  Hepatitis C blood test.  Hepatitis B blood test.  Sexually transmitted disease (STD) testing.  Diabetes screening. This is done by checking your blood sugar (glucose) after you have not eaten for a while (fasting). You may have this done every 1-3 years.  Abdominal aortic aneurysm (AAA) screening. You may need this if you are a current or former smoker.  Osteoporosis. You may be screened starting at age 48 if you are at high risk. Talk with your health care provider about your test results, treatment options, and if necessary, the need for more tests. Vaccines  Your health care provider may recommend certain vaccines, such as:  Influenza  vaccine. This is recommended every year.  Tetanus, diphtheria, and acellular pertussis (Tdap, Td) vaccine. You may need a Td booster every 10 years.  Zoster vaccine. You may need this after age  23.  Pneumococcal 13-valent conjugate (PCV13) vaccine. One dose is recommended after age 1.  Pneumococcal polysaccharide (PPSV23) vaccine. One dose is recommended after age 74. Talk to your health care provider about which screenings and vaccines you need and how often you need them. This information is not intended to replace advice given to you by your health care provider. Make sure you discuss any questions you have with your health care provider. Document Released: 01/20/2015 Document Revised: 09/13/2015 Document Reviewed: 10/25/2014 Elsevier Interactive Patient Education  2017 [place] Prevention in the Home Falls can cause injuries. They can happen to people of all ages. There are many things you can do to make your home safe and to help prevent falls. What can I do on the outside of my home?  Regularly fix the edges of walkways and driveways and fix any cracks.  Remove anything that might make you trip as you walk through a door, such as a raised step or threshold.  Trim any bushes or trees on the path to your home.  Use bright outdoor lighting.  Clear any walking paths of anything that might make someone trip, such as rocks or tools.  Regularly check to see if handrails are loose or broken. Make sure that both sides of any steps have handrails.  Any raised decks and porches should have guardrails on the edges.  Have any leaves, snow, or ice cleared regularly.  Use sand or salt on walking paths during winter.  Clean up any spills in your garage right away. This includes oil or grease spills. What can I do in the bathroom?  Use night lights.  Install grab bars by the toilet and in the tub and shower. Do not use towel bars as grab bars.  Use non-skid mats or decals in the tub or shower.  If you need to sit down in the shower, use a plastic, non-slip stool.  Keep the floor dry. Clean up any water that spills on the floor as soon as it happens.  Remove  soap buildup in the tub or shower regularly.  Attach bath mats securely with double-sided non-slip rug tape.  Do not have throw rugs and other things on the floor that can make you trip. What can I do in the bedroom?  Use night lights.  Make sure that you have a light by your bed that is easy to reach.  Do not use any sheets or blankets that are too big for your bed. They should not hang down onto the floor.  Have a firm chair that has side arms. You can use this for support while you get dressed.  Do not have throw rugs and other things on the floor that can make you trip. What can I do in the kitchen?  Clean up any spills right away.  Avoid walking on wet floors.  Keep items that you use a lot in easy-to-reach places.  If you need to reach something above you, use a strong step stool that has a grab bar.  Keep electrical cords out of the way.  Do not use floor polish or wax that makes floors slippery. If you must use wax, use non-skid floor wax.  Do not have throw rugs and other things on the floor that can  make you trip. What can I do with my stairs?  Do not leave any items on the stairs.  Make sure that there are handrails on both sides of the stairs and use them. Fix handrails that are broken or loose. Make sure that handrails are as long as the stairways.  Check any carpeting to make sure that it is firmly attached to the stairs. Fix any carpet that is loose or worn.  Avoid having throw rugs at the top or bottom of the stairs. If you do have throw rugs, attach them to the floor with carpet tape.  Make sure that you have a light switch at the top of the stairs and the bottom of the stairs. If you do not have them, ask someone to add them for you. What else can I do to help prevent falls?  Wear shoes that:  Do not have high heels.  Have rubber bottoms.  Are comfortable and fit you well.  Are closed at the toe. Do not wear sandals.  If you use a  stepladder:  Make sure that it is fully opened. Do not climb a closed stepladder.  Make sure that both sides of the stepladder are locked into place.  Ask someone to hold it for you, if possible.  Clearly mark and make sure that you can see:  Any grab bars or handrails.  First and last steps.  Where the edge of each step is.  Use tools that help you move around (mobility aids) if they are needed. These include:  Canes.  Walkers.  Scooters.  Crutches.  Turn on the lights when you go into a dark area. Replace any light bulbs as soon as they burn out.  Set up your furniture so you have a clear path. Avoid moving your furniture around.  If any of your floors are uneven, fix them.  If there are any pets around you, be aware of where they are.  Review your medicines with your doctor. Some medicines can make you feel dizzy. This can increase your chance of falling. Ask your doctor what other things that you can do to help prevent falls. This information is not intended to replace advice given to you by your health care provider. Make sure you discuss any questions you have with your health care provider. Document Released: 10/20/2008 Document Revised: 06/01/2015 Document Reviewed: 01/28/2014 Elsevier Interactive Patient Education  2017 [employer].

## 2016-05-15 NOTE — Progress Notes (Signed)
Subjective:   Randy Dalton is a 83 y.o. male who presents for Medicare Annual/Subsequent preventive examination.  Review of Systems:  N/A  Cardiac Risk Factors include: advanced age (>27men, >50 women);diabetes mellitus;dyslipidemia;hypertension;male gender;obesity (BMI >30kg/m2)     Objective:    Vitals: BP (!) 210/98 (BP Location: Right Arm, Cuff Size: Normal)   Pulse 68   Temp 98 F (36.7 C) (Oral)   Ht 5\' 7"  (1.702 m)   Wt 229 lb (103.9 kg)   BMI 35.87 kg/m   Body mass index is 35.87 kg/m.  Tobacco History  Smoking Status  . Never Smoker  Smokeless Tobacco  . Never Used     Counseling given: Not Answered   Past Medical History:  Diagnosis Date  . Diabetes mellitus without complication ([place])   . Diverticulosis   . Hypercholesteremia   . Hypertension   . Hypothyroidism   . Wears dentures    partial top   Past Surgical History:  Procedure Laterality Date  . BACK SURGERY    . CATARACT EXTRACTION W/PHACO Left 10/24/2014   Procedure: CATARACT EXTRACTION PHACO AND INTRAOCULAR LENS PLACEMENT (IOC);  Surgeon: Ronnell Freshwater, MD;  Location: [facility];  Service: Ophthalmology;  Laterality: Left;  DIABETIC - oral meds  . COLONOSCOPY    . HERNIA REPAIR    . STOMACH SURGERY     Family History  Problem Relation Age of Onset  . Hypertension Mother   . Kidney failure Mother   . Diabetes Father   . Lung cancer Brother   . Colon cancer Brother   . Cancer Brother     skin, removed  . Dementia Sister   . Dementia Sister   . Hypertension Sister    History  Sexual Activity  . Sexual activity: Not Currently    Outpatient Encounter Prescriptions as of 05/15/2016  Medication Sig  . amLODipine (NORVASC) 5 MG tablet TAKE 1 TABLET BY MOUTH EVERY DAY  . carvedilol (COREG) 12.5 MG tablet TAKE 1 TABLET BY MOUTH TWICE DAILY  . Cholecalciferol (VITAMIN D) 2000 UNITS tablet Take by mouth.  Marland Kitchen glimepiride (AMARYL) 4 MG tablet TAKE 1 TABLET BY MOUTH EVERY  DAY  . hydrALAZINE (APRESOLINE) 50 MG tablet TAKE ONE TABLET BY MOUTH TWICE DAILY  . hydrochlorothiazide (HYDRODIURIL) 25 MG tablet TAKE 1 TABLET BY MOUTH EVERY DAY  . latanoprost (XALATAN) 0.005 % ophthalmic solution 1 drop at bedtime.  Marland Kitchen levothyroxine (SYNTHROID, LEVOTHROID) 175 MCG tablet TAKE 1 TABLET BY MOUTH EVERY DAY  . losartan (COZAAR) 100 MG tablet TAKE 1 TABLET BY MOUTH EVERY DAY  . Multiple Vitamin (MULTIVITAMIN) tablet Take 1 tablet by mouth daily.  . pioglitazone (ACTOS) 15 MG tablet TAKE 1 TABLET BY MOUTH EVERY DAY  . sildenafil (VIAGRA) 100 MG tablet Take by mouth.  . simvastatin (ZOCOR) 20 MG tablet TAKE ONE TABLET BY MOUTH AT BEDTIME.   No facility-administered encounter medications on file as of 05/15/2016.     Activities of Daily Living In your present state of health, do you have any difficulty performing the following activities: 05/15/2016  Hearing? N  Vision? N  Difficulty concentrating or making decisions? N  Walking or climbing stairs? N  Dressing or bathing? N  Doing errands, shopping? N  Preparing Food and eating ? N  Using the Toilet? N  In the past six months, have you accidently leaked urine? N  Do you have problems with loss of bowel control? N  Managing your Medications? N  Managing your Finances? N  Housekeeping or managing your Housekeeping? N  Some recent data might be hidden    Patient Care Team: Jerrol Banana., MD as PCP - General (Family Medicine) Vin-Parikh, Deirdre Peer, MD as Referring Physician (Ophthalmology) Lavonia Dana, MD as Consulting Physician (Internal Medicine)   Assessment:     Exercise Activities and Dietary recommendations Current Exercise Habits: The patient does not participate in regular exercise at present, Exercise limited by: None identified  Goals    . Increase water intake          Recommend increasing water intake to 4-6 glasses a day.      Fall Risk Fall Risk  05/15/2016 03/23/2015 11/23/2014  07/18/2014  Falls in the past year? No No No No   Depression Screen PHQ 2/9 Scores 05/15/2016 05/15/2016 03/23/2015 11/23/2014  PHQ - 2 Score 0 0 0 0  PHQ- 9 Score 0 - - -    Cognitive Function     6CIT Screen 05/15/2016  What Year? 0 points  What month? 0 points  What time? 0 points  Count back from 20 0 points  Months in reverse 4 points  Repeat phrase 2 points  Total Score 6    Immunization History  Administered Date(s) Administered  . Influenza, High Dose Seasonal PF 11/23/2014, 10/23/2015  . Pneumococcal Conjugate-13 12/13/2013  . Pneumococcal Polysaccharide-23 06/08/2012  . Td 04/28/2003   Screening Tests Health Maintenance  Topic Date Due  . TETANUS/TDAP  01/07/2026 (Originally 04/27/2013)  . OPHTHALMOLOGY EXAM  06/07/2016  . INFLUENZA VACCINE  08/07/2016  . HEMOGLOBIN A1C  09/14/2016  . FOOT EXAM  03/14/2017  . PNA vac Low Risk Adult  Completed      Plan:  I have personally reviewed and addressed the Medicare Annual Wellness questionnaire and have noted the following in the patient's chart:  A. Medical and social history B. Use of alcohol, tobacco or illicit drugs  C. Current medications and supplements D. Functional ability and status E.  Nutritional status F.  Physical activity G. Advance directives H. List of other physicians I.  Hospitalizations, surgeries, and ER visits in previous 12 months J.  [place] such as hearing and vision if needed, cognitive and depression L. Referrals and appointments - none  In addition, I have reviewed and discussed with patient certain preventive protocols, quality metrics, and best practice recommendations. A written personalized care plan for preventive services as well as general preventive health recommendations were provided to patient.  See attached scanned questionnaire for additional information.   Signed,  Fabio Neighbors, LPN Nurse Health Advisor   MD Recommendations:  None. Pt declined tetanus  vaccine today. I have reviewed the health advisors note, was  available for consultation and I agree with documentation and plan. Miguel Aschoff MD [facility]

## 2016-05-15 NOTE — Progress Notes (Signed)
Subjective:  HPI  Hypertension, follow-up:  BP Readings from Last 3 Encounters:  05/15/16 (!) 218/100  05/15/16 (!) 210/98  03/14/16 138/82    He was last seen for hypertension 2 months ago.  BP at that visit was 138/82. Management since that visit includes none. He reports good compliance with treatment. He is not having side effects.  He is exercising. He is adherent to low salt diet.   Outside blood pressures are not being checked because it was running good. He is experiencing none.  Patient denies chest pain, chest pressure/discomfort, claudication, dyspnea, exertional chest pressure/discomfort, fatigue, irregular heart beat, lower extremity edema, near-syncope, orthopnea, palpitations, paroxysmal nocturnal dyspnea, syncope and tachypnea.   Cardiovascular risk factors include advanced age (older than 41 for men, 24 for women), diabetes mellitus, hypertension and male gender.   Wt Readings from Last 3 Encounters:  05/15/16 229 lb (103.9 kg)  05/15/16 229 lb (103.9 kg)  03/14/16 224 lb (101.6 kg)   ------------------------------------------------------------------------  Pt reports that he did not take his medications this morning and did not eat well yesterday. He is also nervous about what this appointment with [facility] was today. He has been having some dental work done and has not been going well. Other than that, he is has not had any more stress than normal.    Prior to Admission medications   Medication Sig Start Date End Date Taking? Authorizing Provider  amLODipine (NORVASC) 5 MG tablet TAKE 1 TABLET BY MOUTH EVERY DAY 05/10/16   Jerrol Banana., MD  carvedilol (COREG) 12.5 MG tablet TAKE 1 TABLET BY MOUTH TWICE DAILY 07/13/15   Jerrol Banana., MD  Cholecalciferol (VITAMIN D) 2000 UNITS tablet Take by mouth. 11/08/10   [provider]  glimepiride (AMARYL) 4 MG tablet TAKE 1 TABLET BY MOUTH EVERY DAY 12/06/15   Jerrol Banana., MD    hydrALAZINE (APRESOLINE) 50 MG tablet TAKE ONE TABLET BY MOUTH TWICE DAILY 10/16/15   Jerrol Banana., MD  hydrochlorothiazide (HYDRODIURIL) 25 MG tablet TAKE 1 TABLET BY MOUTH EVERY DAY 08/12/15   Jerrol Banana., MD  latanoprost (XALATAN) 0.005 % ophthalmic solution 1 drop at bedtime.    [provider]  levothyroxine (SYNTHROID, LEVOTHROID) 175 MCG tablet TAKE 1 TABLET BY MOUTH EVERY DAY 05/02/16   Jerrol Banana., MD  losartan (COZAAR) 100 MG tablet TAKE 1 TABLET BY MOUTH EVERY DAY 07/25/15   Jerrol Banana., MD  Multiple Vitamin (MULTIVITAMIN) tablet Take 1 tablet by mouth daily.    [provider]  pioglitazone (ACTOS) 15 MG tablet TAKE 1 TABLET BY MOUTH EVERY DAY 10/16/15   Jerrol Banana., MD  sildenafil (VIAGRA) 100 MG tablet Take by mouth. 10/09/13   [provider]  simvastatin (ZOCOR) 20 MG tablet TAKE ONE TABLET BY MOUTH AT BEDTIME. 05/10/16   Jerrol Banana., MD    Patient Active Problem List   Diagnosis Date Noted  . Morbid obesity ([place]) 03/17/2016  . Atherosclerosis of renal artery ([place]) 05/17/2014  . ED (erectile dysfunction) of organic origin 05/17/2014  . Essential (primary) hypertension 05/17/2014  . HLD (hyperlipidemia) 05/17/2014  . Adult hypothyroidism 05/17/2014  . Adiposity 05/17/2014  . Arthritis, degenerative 05/17/2014  . Apnea, sleep 05/17/2014  . Diabetes mellitus, type 2 ([place]) 05/17/2014  . Avitaminosis D 05/17/2014    Past Medical History:  Diagnosis Date  . Diabetes mellitus without complication ([place])   .  Diverticulosis   . Hypercholesteremia   . Hypertension   . Hypothyroidism   . Wears dentures    partial top    Social History   Social History  . Marital status: Married    Spouse name: N/A  . Number of children: N/A  . Years of education: N/A   Occupational History  . Not on file.   Social History Main Topics  . Smoking status: Never Smoker  . Smokeless tobacco: Never  Used  . Alcohol use No  . Drug use: No  . Sexual activity: Not Currently   Other Topics Concern  . Not on file   Social History Narrative  . No narrative on file    Allergies  Allergen Reactions  . Aspirin     Feel bad  . Codeine Nausea Only    Review of Systems  Constitutional: Negative.   HENT: Negative.   Eyes: Negative.   Respiratory: Negative.   Cardiovascular: Negative.   Gastrointestinal: Negative.   Genitourinary: Negative.   Musculoskeletal: Negative.   Skin: Negative.   Neurological: Negative.   Endo/Heme/Allergies: Negative.   Psychiatric/Behavioral: Negative.     Immunization History  Administered Date(s) Administered  . Influenza, High Dose Seasonal PF 11/23/2014, 10/23/2015  . Pneumococcal Conjugate-13 12/13/2013  . Pneumococcal Polysaccharide-23 06/08/2012  . Td 04/28/2003    Objective:  BP (!) 218/100 (BP Location: Left Arm, Patient Position: Sitting, Cuff Size: Large)   Pulse 68   Temp 98 F (36.7 C) (Oral)   Wt 229 lb (103.9 kg)   BMI 35.87 kg/m   Physical Exam  Constitutional: He is oriented to person, place, and time and well-developed, well-nourished, and in no distress.  HENT:  Head: Normocephalic and atraumatic.  Right Ear: External ear normal.  Left Ear: External ear normal.  Nose: Nose normal.  Eyes: Conjunctivae and EOM are normal. Pupils are equal, round, and reactive to light.  Neck: Normal range of motion. Neck supple.  Cardiovascular: Normal rate, regular rhythm, normal heart sounds and intact distal pulses.   Pulmonary/Chest: Effort normal and breath sounds normal.  Abdominal: Soft.  Musculoskeletal: Normal range of motion.  Neurological: He is alert and oriented to person, place, and time. He has normal reflexes. Gait normal. GCS score is 15.  Skin: Skin is warm and dry.  Psychiatric: Mood, memory, affect and judgment normal.    Lab Results  Component Value Date   WBC 9.3 03/14/2016   HGB 13.5 11/06/2009   HCT  43.2 03/14/2016   PLT 160 03/14/2016   GLUCOSE 140 (H) 03/14/2016   CHOL 163 03/14/2016   TRIG 139 03/14/2016   HDL 42 03/14/2016   LDLCALC 93 03/14/2016   TSH 3.250 03/14/2016   PSA 5.4 06/30/2013   HGBA1C 6.9 03/14/2016   MICROALBUR 100 10/23/2015    CMP     Component Value Date/Time   NA 141 03/14/2016 1205   K 4.7 03/14/2016 1205   CL 101 03/14/2016 1205   CO2 25 03/14/2016 1205   GLUCOSE 140 (H) 03/14/2016 1205   BUN 27 03/14/2016 1205   CREATININE 1.55 (H) 03/14/2016 1205   CALCIUM 10.1 03/14/2016 1205   PROT 7.3 03/14/2016 1205   ALBUMIN 4.5 03/14/2016 1205   AST 20 03/14/2016 1205   ALT 15 03/14/2016 1205   ALKPHOS 66 03/14/2016 1205   BILITOT 0.6 03/14/2016 1205   GFRNONAA 40 (L) 03/14/2016 1205   GFRAA 46 (L) 03/14/2016 1205    Assessment and Plan :.  1.  Essential (primary) hypertension Increase amlodipine to 10 mg daily. May increase coreg dose next ov. Follow up in 1 week. - amLODipine (NORVASC) 10 MG tablet; Take 1 tablet (10 mg total) by mouth daily.  Dispense: 90 tablet; Refill: 11 2.TIIDM 3.OSA 4.Obesity    HPI, Exam, and A&P Transcribed under the direction and in the presence of Richard L. Cranford Mon, MD  Electronically Signed: Katina Dung, CMA  I have done the exam and reviewed the above chart and it is accurate to the best of my knowledge. Development worker, community has been used in this note in any air is in the dictation or transcription are unintentional.  [facility] 05/15/2016 10:25 AM

## 2016-05-23 ENCOUNTER — Encounter: Payer: Self-pay | Admitting: Family Medicine

## 2016-05-23 ENCOUNTER — Ambulatory Visit (INDEPENDENT_AMBULATORY_CARE_PROVIDER_SITE_OTHER): Payer: Medicare Other | Admitting: Family Medicine

## 2016-05-23 VITALS — BP 148/70 | HR 64 | Temp 97.4°F | Resp 16 | Wt 227.0 lb

## 2016-05-23 DIAGNOSIS — I10 Essential (primary) hypertension: Secondary | ICD-10-CM

## 2016-05-23 DIAGNOSIS — G4733 Obstructive sleep apnea (adult) (pediatric): Secondary | ICD-10-CM

## 2016-05-23 NOTE — Patient Instructions (Signed)
Check blood pressure at the pharmacy to make sure it is staying down.

## 2016-05-23 NOTE — Progress Notes (Signed)
Subjective:  HPI  Hypertension, follow-up:  BP Readings from Last 3 Encounters:  05/23/16 (!) 148/70  05/15/16 (!) 218/100  05/15/16 (!) 210/98    He was last seen for hypertension 1 weeks ago.  BP at that visit was 218/100. Management since that visit includes increase Amlodipine to 10 mg daily. He reports good compliance with treatment. He is not having side effects.  He is exercising.  Outside blood pressures are not being checked because his cuff is broken. He is experiencing none.  Patient denies chest pain, chest pressure/discomfort, claudication, dyspnea, exertional chest pressure/discomfort, fatigue, irregular heart beat, lower extremity edema, near-syncope, orthopnea, palpitations, paroxysmal nocturnal dyspnea and syncope.    Wt Readings from Last 3 Encounters:  05/23/16 227 lb (103 kg)  05/15/16 229 lb (103.9 kg)  05/15/16 229 lb (103.9 kg)   ------------------------------------------------------------------------    Prior to Admission medications   Medication Sig Start Date End Date Taking? Authorizing Provider  amLODipine (NORVASC) 10 MG tablet Take 1 tablet (10 mg total) by mouth daily. 05/15/16   Jerrol Banana., MD  carvedilol (COREG) 12.5 MG tablet TAKE 1 TABLET BY MOUTH TWICE DAILY 07/13/15   Jerrol Banana., MD  Cholecalciferol (VITAMIN D) 2000 UNITS tablet Take by mouth. 11/08/10   [provider]  glimepiride (AMARYL) 4 MG tablet TAKE 1 TABLET BY MOUTH EVERY DAY 12/06/15   Jerrol Banana., MD  hydrALAZINE (APRESOLINE) 50 MG tablet TAKE ONE TABLET BY MOUTH TWICE DAILY 10/16/15   Jerrol Banana., MD  hydrochlorothiazide (HYDRODIURIL) 25 MG tablet TAKE 1 TABLET BY MOUTH EVERY DAY 08/12/15   Jerrol Banana., MD  latanoprost (XALATAN) 0.005 % ophthalmic solution 1 drop at bedtime.    [provider]  levothyroxine (SYNTHROID, LEVOTHROID) 175 MCG tablet TAKE 1 TABLET BY MOUTH EVERY DAY 05/02/16   Jerrol Banana., MD  losartan (COZAAR) 100 MG tablet TAKE 1 TABLET BY MOUTH EVERY DAY 07/25/15   Jerrol Banana., MD  Multiple Vitamin (MULTIVITAMIN) tablet Take 1 tablet by mouth daily.    [provider]  pioglitazone (ACTOS) 15 MG tablet TAKE 1 TABLET BY MOUTH EVERY DAY 10/16/15   Jerrol Banana., MD  sildenafil (VIAGRA) 100 MG tablet Take by mouth. 10/09/13   [provider]  simvastatin (ZOCOR) 20 MG tablet TAKE ONE TABLET BY MOUTH AT BEDTIME. 05/10/16   Jerrol Banana., MD    Patient Active Problem List   Diagnosis Date Noted  . Morbid obesity ([place]) 03/17/2016  . Atherosclerosis of renal artery ([place]) 05/17/2014  . ED (erectile dysfunction) of organic origin 05/17/2014  . Essential (primary) hypertension 05/17/2014  . HLD (hyperlipidemia) 05/17/2014  . Adult hypothyroidism 05/17/2014  . Adiposity 05/17/2014  . Arthritis, degenerative 05/17/2014  . Apnea, sleep 05/17/2014  . Diabetes mellitus, type 2 ([place]) 05/17/2014  . Avitaminosis D 05/17/2014    Past Medical History:  Diagnosis Date  . Diabetes mellitus without complication ([place])   . Diverticulosis   . Hypercholesteremia   . Hypertension   . Hypothyroidism   . Wears dentures    partial top    Social History   Social History  . Marital status: Married    Spouse name: N/A  . Number of children: N/A  . Years of education: N/A   Occupational History  . Not on file.   Social History Main Topics  . Smoking status: Never Smoker  . Smokeless tobacco:  Never Used  . Alcohol use No  . Drug use: No  . Sexual activity: Not Currently   Other Topics Concern  . Not on file   Social History Narrative  . No narrative on file    Allergies  Allergen Reactions  . Aspirin     Feel bad  . Codeine Nausea Only    Review of Systems  Constitutional: Negative.   HENT: Negative.   Eyes: Negative.   Respiratory: Negative.   Cardiovascular: Negative.   Gastrointestinal: Negative.     Genitourinary: Negative.   Musculoskeletal: Negative.   Skin: Negative.   Neurological: Negative.   Endo/Heme/Allergies: Negative.   Psychiatric/Behavioral: Negative.     Immunization History  Administered Date(s) Administered  . Influenza, High Dose Seasonal PF 11/23/2014, 10/23/2015  . Pneumococcal Conjugate-13 12/13/2013  . Pneumococcal Polysaccharide-23 06/08/2012  . Td 04/28/2003    Objective:  BP (!) 148/70 (BP Location: Left Arm, Patient Position: Sitting, Cuff Size: Large)   Pulse 64   Temp 97.4 F (36.3 C) (Oral)   Resp 16   Wt 227 lb (103 kg)   BMI 35.55 kg/m   Physical Exam  Constitutional: He is oriented to person, place, and time and well-developed, well-nourished, and in no distress.  Eyes: Conjunctivae and EOM are normal. Pupils are equal, round, and reactive to light.  Neck: Normal range of motion. Neck supple.  Cardiovascular: Normal rate, regular rhythm, normal heart sounds and intact distal pulses.   Pulmonary/Chest: Effort normal and breath sounds normal.  Musculoskeletal: Normal range of motion.  Neurological: He is alert and oriented to person, place, and time. He has normal reflexes. Gait normal. GCS score is 15.  Skin: Skin is warm and dry.  Psychiatric: Mood, memory, affect and judgment normal.    Lab Results  Component Value Date   WBC 9.3 03/14/2016   HGB 13.5 11/06/2009   HCT 43.2 03/14/2016   PLT 160 03/14/2016   GLUCOSE 140 (H) 03/14/2016   CHOL 163 03/14/2016   TRIG 139 03/14/2016   HDL 42 03/14/2016   LDLCALC 93 03/14/2016   TSH 3.250 03/14/2016   PSA 5.4 06/30/2013   HGBA1C 6.9 03/14/2016   MICROALBUR 100 10/23/2015    CMP     Component Value Date/Time   NA 141 03/14/2016 1205   K 4.7 03/14/2016 1205   CL 101 03/14/2016 1205   CO2 25 03/14/2016 1205   GLUCOSE 140 (H) 03/14/2016 1205   BUN 27 03/14/2016 1205   CREATININE 1.55 (H) 03/14/2016 1205   CALCIUM 10.1 03/14/2016 1205   PROT 7.3 03/14/2016 1205   ALBUMIN 4.5  03/14/2016 1205   AST 20 03/14/2016 1205   ALT 15 03/14/2016 1205   ALKPHOS 66 03/14/2016 1205   BILITOT 0.6 03/14/2016 1205   GFRNONAA 40 (L) 03/14/2016 1205   GFRAA 46 (L) 03/14/2016 1205    Assessment and Plan :  1. Essential (primary) hypertension Much improved. Continue Current medications. Follow up as scheduled.  2.TIIDM  HPI, Exam, and A&P Transcribed under the direction and in the presence of Richard L. Cranford Mon, MD  Electronically Signed: Katina Dung, [place] [facility] 05/23/2016 9:56 AM

## 2016-05-28 DIAGNOSIS — H40053 Ocular hypertension, bilateral: Secondary | ICD-10-CM | POA: Diagnosis not present

## 2016-05-28 LAB — HM DIABETES EYE EXAM

## 2016-06-06 ENCOUNTER — Other Ambulatory Visit: Payer: Self-pay | Admitting: Family Medicine

## 2016-06-15 ENCOUNTER — Other Ambulatory Visit: Payer: Self-pay | Admitting: Family Medicine

## 2016-06-26 DIAGNOSIS — E1122 Type 2 diabetes mellitus with diabetic chronic kidney disease: Secondary | ICD-10-CM | POA: Diagnosis not present

## 2016-06-26 DIAGNOSIS — R809 Proteinuria, unspecified: Secondary | ICD-10-CM | POA: Diagnosis not present

## 2016-06-26 DIAGNOSIS — I129 Hypertensive chronic kidney disease with stage 1 through stage 4 chronic kidney disease, or unspecified chronic kidney disease: Secondary | ICD-10-CM | POA: Diagnosis not present

## 2016-06-26 DIAGNOSIS — N183 Chronic kidney disease, stage 3 (moderate): Secondary | ICD-10-CM | POA: Diagnosis not present

## 2016-06-27 ENCOUNTER — Other Ambulatory Visit: Payer: Self-pay | Admitting: Family Medicine

## 2016-07-22 ENCOUNTER — Ambulatory Visit (INDEPENDENT_AMBULATORY_CARE_PROVIDER_SITE_OTHER): Payer: Medicare Other | Admitting: Family Medicine

## 2016-07-22 VITALS — BP 138/62 | HR 60 | Temp 97.8°F | Resp 16 | Wt 234.0 lb

## 2016-07-22 DIAGNOSIS — I10 Essential (primary) hypertension: Secondary | ICD-10-CM

## 2016-07-22 DIAGNOSIS — E119 Type 2 diabetes mellitus without complications: Secondary | ICD-10-CM

## 2016-07-22 DIAGNOSIS — E7800 Pure hypercholesterolemia, unspecified: Secondary | ICD-10-CM

## 2016-07-22 DIAGNOSIS — Z794 Long term (current) use of insulin: Secondary | ICD-10-CM

## 2016-07-22 DIAGNOSIS — N183 Chronic kidney disease, stage 3 unspecified: Secondary | ICD-10-CM

## 2016-07-22 DIAGNOSIS — E039 Hypothyroidism, unspecified: Secondary | ICD-10-CM

## 2016-07-22 LAB — POCT GLYCOSYLATED HEMOGLOBIN (HGB A1C): Hemoglobin A1C: 6.9

## 2016-07-22 NOTE — Progress Notes (Signed)
Randy Dalton  MRN: [id number] DOB: [birth year]  Subjective:  HPI  Patient is here for follow up. HTN: patient is checking his b/p but is not sure of the readings but knows they have been good. Denies any chest pain or tightness. Just has some feet swelling since increasing Amlodipine to 10 mg 2 months ago in May. BP Readings from Last 3 Encounters:  07/22/16 138/62  05/23/16 (!) 148/70  05/15/16 (!) 218/100   Diabetes: patient checks his sugar sometimes in the morning and sometimes in evening but not sure of the readings he is getting. No numbness or tingling sensation present. Patient had eye exam after his last visit in may he believes and it was a good exam per patient. Lab Results  Component Value Date   HGBA1C 6.9 03/14/2016   Wt Readings from Last 3 Encounters:  07/22/16 234 lb (106.1 kg)  05/23/16 227 lb (103 kg)  05/15/16 229 lb (103.9 kg)   Last lab work was done on 03/14/16-routine and was stable.  Patient Active Problem List   Diagnosis Date Noted  . Morbid obesity ([place]) 03/17/2016  . Atherosclerosis of renal artery ([place]) 05/17/2014  . ED (erectile dysfunction) of organic origin 05/17/2014  . Essential (primary) hypertension 05/17/2014  . HLD (hyperlipidemia) 05/17/2014  . Adult hypothyroidism 05/17/2014  . Adiposity 05/17/2014  . Arthritis, degenerative 05/17/2014  . Apnea, sleep 05/17/2014  . Diabetes mellitus, type 2 ([place]) 05/17/2014  . Avitaminosis D 05/17/2014    Past Medical History:  Diagnosis Date  . Diabetes mellitus without complication ([place])   . Diverticulosis   . Hypercholesteremia   . Hypertension   . Hypothyroidism   . Wears dentures    partial top    Social History   Social History  . Marital status: Married    Spouse name: N/A  . Number of children: N/A  . Years of education: N/A   Occupational History  . Not on file.   Social History Main Topics  . Smoking status: Never Smoker  . Smokeless tobacco: Never Used  . Alcohol use  No  . Drug use: No  . Sexual activity: Not Currently   Other Topics Concern  . Not on file   Social History Narrative  . No narrative on file    Outpatient Encounter Prescriptions as of 07/22/2016  Medication Sig Note  . amLODipine (NORVASC) 10 MG tablet Take 1 tablet (10 mg total) by mouth daily.   . carvedilol (COREG) 12.5 MG tablet TAKE 1 TABLET BY MOUTH TWICE DAILY   . Cholecalciferol (VITAMIN D) 2000 UNITS tablet Take by mouth. 05/17/2014: Received from: [facility]  . glimepiride (AMARYL) 4 MG tablet TAKE 1 TABLET BY MOUTH EVERY DAY   . hydrALAZINE (APRESOLINE) 50 MG tablet TAKE ONE TABLET BY MOUTH TWICE DAILY   . hydrochlorothiazide (HYDRODIURIL) 25 MG tablet TAKE 1 TABLET BY MOUTH EVERY DAY   . latanoprost (XALATAN) 0.005 % ophthalmic solution 1 drop at bedtime.   Marland Kitchen levothyroxine (SYNTHROID, LEVOTHROID) 175 MCG tablet TAKE 1 TABLET BY MOUTH EVERY DAY   . losartan (COZAAR) 100 MG tablet TAKE 1 TABLET BY MOUTH EVERY DAY   . Multiple Vitamin (MULTIVITAMIN) tablet Take 1 tablet by mouth daily.   . pioglitazone (ACTOS) 15 MG tablet TAKE 1 TABLET BY MOUTH EVERY DAY   . sildenafil (VIAGRA) 100 MG tablet Take by mouth. 05/17/2014: Medication taken as needed.  Received from: [facility]  . simvastatin (ZOCOR) 20 MG tablet TAKE  ONE TABLET BY MOUTH AT BEDTIME.   . TRADJENTA 5 MG TABS tablet TAKE ONE TABLET BY MOUTH EVERY DAY    No facility-administered encounter medications on file as of 07/22/2016.     Allergies  Allergen Reactions  . Aspirin     Feel bad  . Codeine Nausea Only    Review of Systems  Constitutional: Negative.   Eyes: Negative.   Respiratory: Negative.   Cardiovascular: Positive for leg swelling. Negative for chest pain and palpitations.  Gastrointestinal: Negative.   Musculoskeletal: Negative.   Skin: Negative.   Neurological: Negative.   Endo/Heme/Allergies: Negative.   Psychiatric/Behavioral: Negative.     Objective:    BP 138/62   Pulse 60   Temp 97.8 F (36.6 C)   Resp 16   Wt 234 lb (106.1 kg)   BMI 36.65 kg/m   Physical Exam  Constitutional: He is oriented to person, place, and time and well-developed, well-nourished, and in no distress.  HENT:  Head: Normocephalic and atraumatic.  Right Ear: External ear normal.  Left Ear: External ear normal.  Nose: Nose normal.  Eyes: Pupils are equal, round, and reactive to light. Conjunctivae are normal. No scleral icterus.  Neck: Normal range of motion. Neck supple. No thyromegaly present.  Cardiovascular: Normal rate, regular rhythm, normal heart sounds and intact distal pulses.  Exam reveals no gallop.   No murmur heard. Pulmonary/Chest: Effort normal and breath sounds normal. No respiratory distress. He has no wheezes.  Abdominal: Soft.  Musculoskeletal: He exhibits edema (1+). He exhibits no tenderness.  Neurological: He is alert and oriented to person, place, and time. Gait normal. GCS score is 15.  Skin: Skin is warm and dry.  Psychiatric: Mood, memory, affect and judgment normal.   Assessment and Plan :  1. Essential (primary) hypertension Stable. Continue current medication.  2. Type 2 diabetes mellitus without complication, with long-term current use of insulin (HCC) A1C 6.9. Stable. Continue current regimen. - POCT HgB A1C  3. Pure hypercholesterolemia Stable on last check in march 2018.  4. Adult hypothyroidism  5. CKD (chronic kidney disease), stage III Stable.  HPI, Exam and A&P transcribed by Theressa Millard, RMA under direction and in the presence of Miguel Aschoff, MD. I have done the exam and reviewed the chart and it is accurate to the best of my knowledge. Development worker, community has been used and  any errors in dictation or transcription are unintentional. Miguel Aschoff M.D. [facility]

## 2016-07-29 ENCOUNTER — Other Ambulatory Visit: Payer: Self-pay | Admitting: Family Medicine

## 2016-10-22 ENCOUNTER — Other Ambulatory Visit: Payer: Self-pay | Admitting: Family Medicine

## 2016-10-22 NOTE — Telephone Encounter (Signed)
Please review for dr gilbert-Maritta Kief V Gwenevere G[place], RMA  

## 2016-10-31 ENCOUNTER — Ambulatory Visit (INDEPENDENT_AMBULATORY_CARE_PROVIDER_SITE_OTHER): Payer: Medicare Other

## 2016-10-31 DIAGNOSIS — Z23 Encounter for immunization: Secondary | ICD-10-CM | POA: Diagnosis not present

## 2016-11-05 ENCOUNTER — Other Ambulatory Visit: Payer: Self-pay | Admitting: Family Medicine

## 2016-12-02 ENCOUNTER — Other Ambulatory Visit: Payer: Self-pay

## 2016-12-02 ENCOUNTER — Ambulatory Visit (INDEPENDENT_AMBULATORY_CARE_PROVIDER_SITE_OTHER): Payer: Medicare Other | Admitting: Family Medicine

## 2016-12-02 VITALS — BP 160/88 | HR 64 | Temp 97.6°F | Resp 16 | Wt 231.0 lb

## 2016-12-02 DIAGNOSIS — Z794 Long term (current) use of insulin: Secondary | ICD-10-CM

## 2016-12-02 DIAGNOSIS — I10 Essential (primary) hypertension: Secondary | ICD-10-CM | POA: Diagnosis not present

## 2016-12-02 DIAGNOSIS — E782 Mixed hyperlipidemia: Secondary | ICD-10-CM | POA: Diagnosis not present

## 2016-12-02 DIAGNOSIS — E119 Type 2 diabetes mellitus without complications: Secondary | ICD-10-CM

## 2016-12-02 LAB — POCT GLYCOSYLATED HEMOGLOBIN (HGB A1C): Hemoglobin A1C: 7.1

## 2016-12-02 MED ORDER — HYDRALAZINE HCL 100 MG PO TABS: 100.0000 mg | ORAL_TABLET | Freq: Two times a day (BID) | ORAL | 3 refills | Status: DC

## 2016-12-02 NOTE — Progress Notes (Signed)
Randy Dalton  MRN: [id number] DOB: [birth year]  Subjective:  HPI   Hypertension- On his last visit his blood pressure was stable.  However prior to that it had been elevated.  He is currently on Amlodipine, Carvedilol, Hydralazine, HCTZ, and Losartan.  The patient states he checks his blood pressure outside of the office but does not recall any readings.  He did state that he gets varied readings.  BP Readings from Last 3 Encounters:  12/02/16 (!) 160/88  07/22/16 138/62  05/23/16 (!) 148/70   Diabetes-His last A1C was done on 07/22/16 and was 6.9.  He does not check his glucose at home.      Patient Active Problem List   Diagnosis Date Noted  . Morbid obesity ([place]) 03/17/2016  . Atherosclerosis of renal artery ([place]) 05/17/2014  . ED (erectile dysfunction) of organic origin 05/17/2014  . Essential (primary) hypertension 05/17/2014  . HLD (hyperlipidemia) 05/17/2014  . Adult hypothyroidism 05/17/2014  . Adiposity 05/17/2014  . Arthritis, degenerative 05/17/2014  . Apnea, sleep 05/17/2014  . Diabetes mellitus, type 2 ([place]) 05/17/2014  . Avitaminosis D 05/17/2014    Past Medical History:  Diagnosis Date  . Diabetes mellitus without complication ([place])   . Diverticulosis   . Hypercholesteremia   . Hypertension   . Hypothyroidism   . Wears dentures    partial top    Social History   Socioeconomic History  . Marital status: Married    Spouse name: Not on file  . Number of children: Not on file  . Years of education: Not on file  . Highest education level: Not on file  Social Needs  . Financial resource strain: Not on file  . Food insecurity - worry: Not on file  . Food insecurity - inability: Not on file  . Transportation needs - medical: Not on file  . Transportation needs - non-medical: Not on file  Occupational History  . Not on file  Tobacco Use  . Smoking status: Never Smoker  . Smokeless tobacco: Never Used  Substance and Sexual Activity  . Alcohol use:  No    Alcohol/week: 0.0 oz  . Drug use: No  . Sexual activity: Not Currently  Other Topics Concern  . Not on file  Social History Narrative  . Not on file    Outpatient Encounter Medications as of 12/02/2016  Medication Sig Note  . amLODipine (NORVASC) 10 MG tablet Take 1 tablet (10 mg total) by mouth daily.   . carvedilol (COREG) 12.5 MG tablet TAKE 1 TABLET BY MOUTH TWICE DAILY   . Cholecalciferol (VITAMIN D) 2000 UNITS tablet Take by mouth. 05/17/2014: Received from: [facility]  . glimepiride (AMARYL) 4 MG tablet TAKE 1 TABLET BY MOUTH EVERY DAY   . hydrALAZINE (APRESOLINE) 50 MG tablet TAKE ONE TABLET BY MOUTH TWICE DAILY   . hydrochlorothiazide (HYDRODIURIL) 25 MG tablet TAKE 1 TABLET BY MOUTH EVERY DAY   . latanoprost (XALATAN) 0.005 % ophthalmic solution 1 drop at bedtime.   Marland Kitchen levothyroxine (SYNTHROID, LEVOTHROID) 175 MCG tablet TAKE 1 TABLET BY MOUTH EVERY DAY   . losartan (COZAAR) 100 MG tablet TAKE 1 TABLET BY MOUTH EVERY DAY   . Multiple Vitamin (MULTIVITAMIN) tablet Take 1 tablet by mouth daily.   . pioglitazone (ACTOS) 15 MG tablet TAKE 1 TABLET BY MOUTH EVERY DAY   . sildenafil (VIAGRA) 100 MG tablet Take by mouth. 05/17/2014: Medication taken as needed.  Received from: [facility]  . simvastatin (ZOCOR)  20 MG tablet TAKE ONE TABLET BY MOUTH AT BEDTIME.   . TRADJENTA 5 MG TABS tablet TAKE ONE TABLET BY MOUTH EVERY DAY    No facility-administered encounter medications on file as of 12/02/2016.     Allergies  Allergen Reactions  . Aspirin     Feel bad  . Codeine Nausea Only    Review of Systems  Constitutional: Negative for fever and malaise/fatigue.  Eyes: Negative.   Respiratory: Negative for cough, shortness of breath and wheezing.   Cardiovascular: Negative for chest pain, palpitations, orthopnea, claudication and leg swelling.  Gastrointestinal: Negative.   Genitourinary: Negative for frequency.  Skin: Negative.     Neurological: Negative for weakness.  Endo/Heme/Allergies: Negative for polydipsia.  Psychiatric/Behavioral: Negative.     Objective:  BP (!) 160/88 (BP Location: Right Arm, Patient Position: Sitting, Cuff Size: Normal)   Pulse 64   Temp 97.6 F (36.4 C) (Oral)   Resp 16   Wt 231 lb (104.8 kg)   BMI 36.18 kg/m   Physical Exam  Constitutional: He is oriented to person, place, and time and well-developed, well-nourished, and in no distress.  HENT:  Head: Normocephalic and atraumatic.  Right Ear: External ear normal.  Left Ear: External ear normal.  Nose: Nose normal.  Eyes: Conjunctivae are normal. Pupils are equal, round, and reactive to light. No scleral icterus.  Neck: Normal range of motion. No thyromegaly present.  Cardiovascular: Normal rate, regular rhythm and normal heart sounds.  Pulmonary/Chest: Effort normal and breath sounds normal.  Abdominal: Soft.  Musculoskeletal: He exhibits edema (1+ bilaterally).  Neurological: He is alert and oriented to person, place, and time.  Skin: Skin is warm and dry.  Psychiatric: Mood, memory, affect and judgment normal.    Assessment and Plan :  TIIDM Obesity HTN HLD  I have done the exam and reviewed the chart and it is accurate to the best of my knowledge. Development worker, community has been used and  any errors in dictation or transcription are unintentional. Miguel Aschoff M.D. [facility]

## 2016-12-02 NOTE — Patient Instructions (Signed)
Hydralazine- patient had elevated blood pressure today and the last few visits.   We would like him to increase his Hydralazine from 50 mg twice daily to 100 mg twice daily.  So not to waste what he has at home he can take 2 of the 50 mg twice daily until he runs out and then get the new prescription.  We will need to see him in 2 months to follow up on his blood pressure.

## 2016-12-10 ENCOUNTER — Encounter: Payer: Self-pay | Admitting: Family Medicine

## 2016-12-13 ENCOUNTER — Other Ambulatory Visit: Payer: Self-pay | Admitting: Family Medicine

## 2016-12-18 DIAGNOSIS — R809 Proteinuria, unspecified: Secondary | ICD-10-CM | POA: Diagnosis not present

## 2016-12-18 DIAGNOSIS — N183 Chronic kidney disease, stage 3 (moderate): Secondary | ICD-10-CM | POA: Diagnosis not present

## 2016-12-18 DIAGNOSIS — E1122 Type 2 diabetes mellitus with diabetic chronic kidney disease: Secondary | ICD-10-CM | POA: Diagnosis not present

## 2016-12-18 DIAGNOSIS — I129 Hypertensive chronic kidney disease with stage 1 through stage 4 chronic kidney disease, or unspecified chronic kidney disease: Secondary | ICD-10-CM | POA: Diagnosis not present

## 2017-01-28 ENCOUNTER — Inpatient Hospital Stay
Admission: EM | Admit: 2017-01-28 | Discharge: 2017-01-30 | DRG: 291 | Disposition: A | Discharge status: Home / Self Care | Payer: Medicare Other | Attending: Internal Medicine | Admitting: Internal Medicine

## 2017-01-28 ENCOUNTER — Inpatient Hospital Stay
Admit: 2017-01-28 | Discharge: 2017-01-28 | Disposition: A | Discharge status: Home / Self Care | Payer: Medicare Other | Attending: Internal Medicine | Admitting: Internal Medicine

## 2017-01-28 ENCOUNTER — Other Ambulatory Visit: Payer: Self-pay

## 2017-01-28 ENCOUNTER — Ambulatory Visit (INDEPENDENT_AMBULATORY_CARE_PROVIDER_SITE_OTHER): Payer: Medicare Other | Admitting: Family Medicine

## 2017-01-28 ENCOUNTER — Encounter: Payer: Self-pay | Admitting: Emergency Medicine

## 2017-01-28 ENCOUNTER — Emergency Department: Payer: Medicare Other

## 2017-01-28 VITALS — BP 148/70 | HR 86 | Temp 97.3°F

## 2017-01-28 DIAGNOSIS — I509 Heart failure, unspecified: Secondary | ICD-10-CM

## 2017-01-28 DIAGNOSIS — E039 Hypothyroidism, unspecified: Secondary | ICD-10-CM | POA: Diagnosis present

## 2017-01-28 DIAGNOSIS — R0902 Hypoxemia: Secondary | ICD-10-CM

## 2017-01-28 DIAGNOSIS — E7800 Pure hypercholesterolemia, unspecified: Secondary | ICD-10-CM | POA: Diagnosis present

## 2017-01-28 DIAGNOSIS — R531 Weakness: Secondary | ICD-10-CM | POA: Diagnosis not present

## 2017-01-28 DIAGNOSIS — I10 Essential (primary) hypertension: Secondary | ICD-10-CM | POA: Diagnosis not present

## 2017-01-28 DIAGNOSIS — J9601 Acute respiratory failure with hypoxia: Secondary | ICD-10-CM | POA: Diagnosis present

## 2017-01-28 DIAGNOSIS — Z66 Do not resuscitate: Secondary | ICD-10-CM | POA: Diagnosis present

## 2017-01-28 DIAGNOSIS — I5031 Acute diastolic (congestive) heart failure: Secondary | ICD-10-CM | POA: Diagnosis not present

## 2017-01-28 DIAGNOSIS — I5021 Acute systolic (congestive) heart failure: Secondary | ICD-10-CM

## 2017-01-28 DIAGNOSIS — E1122 Type 2 diabetes mellitus with diabetic chronic kidney disease: Secondary | ICD-10-CM | POA: Diagnosis present

## 2017-01-28 DIAGNOSIS — Z961 Presence of intraocular lens: Secondary | ICD-10-CM | POA: Diagnosis present

## 2017-01-28 DIAGNOSIS — I248 Other forms of acute ischemic heart disease: Secondary | ICD-10-CM | POA: Diagnosis present

## 2017-01-28 DIAGNOSIS — R0609 Other forms of dyspnea: Secondary | ICD-10-CM

## 2017-01-28 DIAGNOSIS — I130 Hypertensive heart and chronic kidney disease with heart failure and stage 1 through stage 4 chronic kidney disease, or unspecified chronic kidney disease: Secondary | ICD-10-CM | POA: Diagnosis not present

## 2017-01-28 DIAGNOSIS — Z794 Long term (current) use of insulin: Secondary | ICD-10-CM

## 2017-01-28 DIAGNOSIS — I110 Hypertensive heart disease with heart failure: Secondary | ICD-10-CM | POA: Diagnosis not present

## 2017-01-28 DIAGNOSIS — Z9842 Cataract extraction status, left eye: Secondary | ICD-10-CM

## 2017-01-28 DIAGNOSIS — Z9841 Cataract extraction status, right eye: Secondary | ICD-10-CM

## 2017-01-28 DIAGNOSIS — R0602 Shortness of breath: Secondary | ICD-10-CM | POA: Diagnosis not present

## 2017-01-28 DIAGNOSIS — Z7984 Long term (current) use of oral hypoglycemic drugs: Secondary | ICD-10-CM

## 2017-01-28 DIAGNOSIS — Z79899 Other long term (current) drug therapy: Secondary | ICD-10-CM

## 2017-01-28 DIAGNOSIS — E119 Type 2 diabetes mellitus without complications: Secondary | ICD-10-CM

## 2017-01-28 DIAGNOSIS — I5041 Acute combined systolic (congestive) and diastolic (congestive) heart failure: Secondary | ICD-10-CM | POA: Diagnosis present

## 2017-01-28 DIAGNOSIS — E782 Mixed hyperlipidemia: Secondary | ICD-10-CM | POA: Diagnosis not present

## 2017-01-28 DIAGNOSIS — N183 Chronic kidney disease, stage 3 (moderate): Secondary | ICD-10-CM | POA: Diagnosis present

## 2017-01-28 LAB — CBC WITH DIFFERENTIAL/PLATELET
BASOS PCT: 1 %
Basophils Absolute: 0.1 10*3/uL (ref 0–0.1)
EOS ABS: 0 10*3/uL (ref 0–0.7)
Eosinophils Relative: 0 %
HCT: 42.5 % (ref 40.0–52.0)
HEMOGLOBIN: 14.1 g/dL (ref 13.0–18.0)
LYMPHS ABS: 0.8 10*3/uL — AB (ref 1.0–3.6)
Lymphocytes Relative: 7 %
MCH: 31.7 pg (ref 26.0–34.0)
MCHC: 33.3 g/dL (ref 32.0–36.0)
MCV: 95.3 fL (ref 80.0–100.0)
MONO ABS: 0.6 10*3/uL (ref 0.2–1.0)
MONOS PCT: 5 %
NEUTROS PCT: 87 %
Neutro Abs: 10.6 10*3/uL — ABNORMAL HIGH (ref 1.4–6.5)
Platelets: 146 10*3/uL — ABNORMAL LOW (ref 150–440)
RBC: 4.46 MIL/uL (ref 4.40–5.90)
RDW: 13.6 % (ref 11.5–14.5)
WBC: 12.2 10*3/uL — ABNORMAL HIGH (ref 3.8–10.6)

## 2017-01-28 LAB — COMPREHENSIVE METABOLIC PANEL
ALK PHOS: 60 U/L (ref 38–126)
ALT: 16 U/L — ABNORMAL LOW (ref 17–63)
ANION GAP: 10 (ref 5–15)
AST: 20 U/L (ref 15–41)
Albumin: 4.4 g/dL (ref 3.5–5.0)
BILIRUBIN TOTAL: 1.3 mg/dL — AB (ref 0.3–1.2)
BUN: 48 mg/dL — ABNORMAL HIGH (ref 6–20)
CALCIUM: 9.7 mg/dL (ref 8.9–10.3)
CO2: 21 mmol/L — AB (ref 22–32)
Chloride: 106 mmol/L (ref 101–111)
Creatinine, Ser: 1.71 mg/dL — ABNORMAL HIGH (ref 0.61–1.24)
GFR calc non Af Amer: 34 mL/min — ABNORMAL LOW (ref >60)
GFR, EST AFRICAN AMERICAN: 40 mL/min — AB (ref >60)
Glucose, Bld: 273 mg/dL — ABNORMAL HIGH (ref 65–99)
POTASSIUM: 4 mmol/L (ref 3.5–5.1)
SODIUM: 137 mmol/L (ref 135–145)
TOTAL PROTEIN: 7.8 g/dL (ref 6.5–8.1)

## 2017-01-28 LAB — TROPONIN I
TROPONIN I: 0.05 ng/mL — AB (ref <0.03)
Troponin I: 0.05 ng/mL (ref <0.03)
Troponin I: 0.06 ng/mL (ref <0.03)
Troponin I: 0.07 ng/mL (ref <0.03)

## 2017-01-28 LAB — BRAIN NATRIURETIC PEPTIDE: B Natriuretic Peptide: 861 pg/mL — ABNORMAL HIGH (ref 0.0–100.0)

## 2017-01-28 LAB — GLUCOSE, CAPILLARY
GLUCOSE-CAPILLARY: 217 mg/dL — AB (ref 65–99)
Glucose-Capillary: 233 mg/dL — ABNORMAL HIGH (ref 65–99)

## 2017-01-28 MED ORDER — LEVOTHYROXINE SODIUM 50 MCG PO TABS
175.0000 ug | ORAL_TABLET | Freq: Every day | ORAL | Status: DC
  Administered 2017-01-28 – 2017-01-30 (×3): 175 ug via ORAL
  Filled 2017-01-28 (×3): qty 1

## 2017-01-28 MED ORDER — HYDRALAZINE HCL 50 MG PO TABS
100.0000 mg | ORAL_TABLET | Freq: Two times a day (BID) | ORAL | Status: DC
  Administered 2017-01-28 – 2017-01-30 (×5): 100 mg via ORAL
  Filled 2017-01-28 (×5): qty 2

## 2017-01-28 MED ORDER — LATANOPROST 0.005 % OP SOLN
1.0000 [drp] | Freq: Every day | OPHTHALMIC | Status: DC
  Administered 2017-01-28 – 2017-01-29 (×2): 1 [drp] via OPHTHALMIC
  Filled 2017-01-28 (×2): qty 2.5

## 2017-01-28 MED ORDER — FUROSEMIDE 10 MG/ML IJ SOLN
20.0000 mg | Freq: Once | INTRAMUSCULAR | Status: AC
  Administered 2017-01-28: 20 mg via INTRAVENOUS
  Filled 2017-01-28: qty 4

## 2017-01-28 MED ORDER — ADULT MULTIVITAMIN W/MINERALS CH
1.0000 | ORAL_TABLET | Freq: Every day | ORAL | Status: DC
  Administered 2017-01-28 – 2017-01-30 (×3): 1 via ORAL
  Filled 2017-01-28 (×3): qty 1

## 2017-01-28 MED ORDER — LINAGLIPTIN 5 MG PO TABS
5.0000 mg | ORAL_TABLET | Freq: Every day | ORAL | Status: DC
  Administered 2017-01-28 – 2017-01-30 (×3): 5 mg via ORAL
  Filled 2017-01-28 (×3): qty 1

## 2017-01-28 MED ORDER — DOCUSATE SODIUM 100 MG PO CAPS
100.0000 mg | ORAL_CAPSULE | Freq: Two times a day (BID) | ORAL | Status: DC | PRN
Start: 2017-01-28 — End: 2017-01-30

## 2017-01-28 MED ORDER — CARVEDILOL 12.5 MG PO TABS
12.5000 mg | ORAL_TABLET | Freq: Two times a day (BID) | ORAL | Status: DC
  Administered 2017-01-28 – 2017-01-30 (×5): 12.5 mg via ORAL
  Filled 2017-01-28 (×5): qty 1

## 2017-01-28 MED ORDER — SIMVASTATIN 20 MG PO TABS
20.0000 mg | ORAL_TABLET | Freq: Every day | ORAL | Status: DC
  Administered 2017-01-28 – 2017-01-29 (×2): 20 mg via ORAL
  Filled 2017-01-28 (×2): qty 1

## 2017-01-28 MED ORDER — FUROSEMIDE 10 MG/ML IJ SOLN
20.0000 mg | Freq: Two times a day (BID) | INTRAMUSCULAR | Status: DC
  Administered 2017-01-28 – 2017-01-30 (×4): 20 mg via INTRAVENOUS
  Filled 2017-01-28 (×4): qty 2

## 2017-01-28 MED ORDER — HEPARIN SODIUM (PORCINE) 5000 UNIT/ML IJ SOLN
5000.0000 [IU] | Freq: Three times a day (TID) | INTRAMUSCULAR | Status: DC
  Administered 2017-01-28 – 2017-01-30 (×6): 5000 [IU] via SUBCUTANEOUS
  Filled 2017-01-28 (×6): qty 1

## 2017-01-28 MED ORDER — GLIMEPIRIDE 2 MG PO TABS
4.0000 mg | ORAL_TABLET | Freq: Every day | ORAL | Status: DC
  Administered 2017-01-28 – 2017-01-30 (×3): 4 mg via ORAL
  Filled 2017-01-28: qty 2
  Filled 2017-01-28: qty 1
  Filled 2017-01-28: qty 2

## 2017-01-28 MED ORDER — INSULIN ASPART 100 UNIT/ML ~~LOC~~ SOLN: 0.0000 [IU] | Freq: Three times a day (TID) | SUBCUTANEOUS | Status: DC

## 2017-01-28 NOTE — ED Notes (Signed)
Pt O2 currently at 88% on room air. Nasal cannula applied to pt at 2L, pt now 96%

## 2017-01-28 NOTE — ED Notes (Signed)
Date and time results received: 01/28/17 1332 (use smartphrase ".now" to insert current time)  Test: troponin Critical Value: 0.05  Name of Provider Notified: Cinda Quest MD  Orders Received? Or Actions Taken?: Orders Received - See Orders for details

## 2017-01-28 NOTE — H&P (Signed)
[place] [facility] [place] NAME: Randy Dalton    MR#:  [id number]  DATE OF BIRTH:  1927-09-18  DATE OF ADMISSION:  01/28/2017  PRIMARY CARE PHYSICIAN: Jerrol Banana., MD   REQUESTING/REFERRING PHYSICIAN: Cinda Quest  CHIEF COMPLAINT:   Chief Complaint  Patient presents with  . Shortness of Breath    HISTORY OF PRESENT ILLNESS: Randy Dalton  is a 84 y.o. male with a known history of diabetes, hypercholesterolemia, hypertension- for last 1-2 days started having short of breath with minimal exertion and feeling generalized weakness. Also noted swelling on his legs and his arms. Also has some symptoms of orthopnea so decided to come to emergency room. In ER noted to have acute CHF. No history of any cardiac issues so given to hospitalist team for further management.  PAST MEDICAL HISTORY:   Past Medical History:  Diagnosis Date  . Diabetes mellitus without complication ([place])   . Diverticulosis   . Hypercholesteremia   . Hypertension   . Hypothyroidism   . Wears dentures    partial top    PAST SURGICAL HISTORY:  Past Surgical History:  Procedure Laterality Date  . BACK SURGERY    . CATARACT EXTRACTION W/PHACO Left 10/24/2014   Procedure: CATARACT EXTRACTION PHACO AND INTRAOCULAR LENS PLACEMENT (IOC);  Surgeon: Ronnell Freshwater, MD;  Location: [facility];  Service: Ophthalmology;  Laterality: Left;  DIABETIC - oral meds  . COLONOSCOPY    . HERNIA REPAIR    . STOMACH SURGERY      SOCIAL HISTORY:  Social History   Tobacco Use  . Smoking status: Never Smoker  . Smokeless tobacco: Never Used  Substance Use Topics  . Alcohol use: No    Alcohol/week: 0.0 oz    FAMILY HISTORY:  Family History  Problem Relation Age of Onset  . Hypertension Mother   . Kidney failure Mother   . Diabetes Father   . Lung cancer Brother   . Colon cancer Brother   . Cancer Brother        skin, removed  . Dementia Sister   . Dementia  Sister   . Hypertension Sister     DRUG ALLERGIES:  Allergies  Allergen Reactions  . Aspirin     Feel bad  . Codeine Nausea Only    REVIEW OF SYSTEMS:   CONSTITUTIONAL: No fever, fatigue or weakness.  EYES: No blurred or double vision.  EARS, NOSE, AND THROAT: No tinnitus or ear pain.  RESPIRATORY: No cough,positive for shortness of breath,no wheezing or hemoptysis.  CARDIOVASCULAR: No chest pain,positive for orthopnea, edema.  GASTROINTESTINAL: No nausea, vomiting, diarrhea or abdominal pain.  GENITOURINARY: No dysuria, hematuria.  ENDOCRINE: No polyuria, nocturia,  HEMATOLOGY: No anemia, easy bruising or bleeding SKIN: No rash or lesion. MUSCULOSKELETAL: No joint pain or arthritis.   NEUROLOGIC: No tingling, numbness, weakness.  PSYCHIATRY: No anxiety or depression.   MEDICATIONS AT HOME:  Prior to Admission medications   Medication Sig Start Date End Date Taking? Authorizing Provider  amLODipine (NORVASC) 10 MG tablet Take 1 tablet (10 mg total) by mouth daily. 05/15/16  Yes Jerrol Banana., MD  carvedilol (COREG) 12.5 MG tablet TAKE 1 TABLET BY MOUTH TWICE DAILY 06/27/16  Yes Jerrol Banana., MD  Cholecalciferol (VITAMIN D) 2000 UNITS tablet Take 2,000 Units by mouth daily.  11/08/10  Yes [provider]  glimepiride (AMARYL) 4 MG tablet TAKE 1 TABLET BY MOUTH EVERY DAY 12/18/16  Yes Jerrol Banana., MD  hydrALAZINE (APRESOLINE) 100 MG tablet Take 1 tablet (100 mg total) by mouth 2 (two) times daily. 12/02/16  Yes Jerrol Banana., MD  latanoprost (XALATAN) 0.005 % ophthalmic solution 1 drop at bedtime.   Yes [provider]  levothyroxine (SYNTHROID, LEVOTHROID) 175 MCG tablet TAKE 1 TABLET BY MOUTH EVERY DAY 05/02/16  Yes Jerrol Banana., MD  losartan (COZAAR) 100 MG tablet TAKE 1 TABLET BY MOUTH EVERY DAY 07/29/16  Yes Jerrol Banana., MD  Multiple Vitamin (MULTIVITAMIN) tablet Take 1 tablet by mouth daily.   Yes  [provider]  simvastatin (ZOCOR) 20 MG tablet TAKE ONE TABLET BY MOUTH AT BEDTIME. 05/10/16  Yes Jerrol Banana., MD  TRADJENTA 5 MG TABS tablet TAKE ONE TABLET BY MOUTH EVERY DAY 06/17/16  Yes Jerrol Banana., MD  hydrochlorothiazide (HYDRODIURIL) 25 MG tablet TAKE 1 TABLET BY MOUTH EVERY DAY Patient not taking: Reported on 01/28/2017 08/12/15   Jerrol Banana., MD  pioglitazone (ACTOS) 15 MG tablet TAKE 1 TABLET BY MOUTH EVERY DAY Patient not taking: Reported on 01/28/2017 11/05/16   Jerrol Banana., MD  sildenafil (VIAGRA) 100 MG tablet Take 100 mg by mouth as needed.  10/09/13   [provider]      PHYSICAL EXAMINATION:   VITAL SIGNS: Blood pressure (!) 148/73, pulse 65, resp. rate 18, weight 104.8 kg (231 lb), SpO2 97 %.  GENERAL:  84 y.o.-year-old patient lying in the bed with no acute distress.  EYES: Pupils equal, round, reactive to light and accommodation. No scleral icterus. Extraocular muscles intact.  HEENT: Head atraumatic, normocephalic. Oropharynx and nasopharynx clear.  NECK:  Supple, no jugular venous distention. No thyroid enlargement, no tenderness.  LUNGS: Normal breath sounds bilaterally, no wheezing, bilateral crepitation. No use of accessory muscles of respiration.  CARDIOVASCULAR: S1, S2 normal. No murmurs, rubs, or gallops.  ABDOMEN: Soft, nontender, nondistended. Bowel sounds present. No organomegaly or mass.  EXTREMITIES: Bilateral pedal edema, no cyanosis, or clubbing.  NEUROLOGIC: Cranial nerves II through XII are intact. Muscle strength 5/5 in all extremities. Sensation intact. Gait not checked.  PSYCHIATRIC: The patient is alert and oriented x 3.  SKIN: No obvious rash, lesion, or ulcer.   LABORATORY PANEL:   CBC Recent Labs  Lab 01/28/17 1146  WBC 12.2*  HGB 14.1  HCT 42.5  PLT 146*  MCV 95.3  MCH 31.7  MCHC 33.3  RDW 13.6  LYMPHSABS 0.8*  MONOABS 0.6  EOSABS 0.0  BASOSABS 0.1    ------------------------------------------------------------------------------------------------------------------  Chemistries  Recent Labs  Lab 01/28/17 1146  NA 137  K 4.0  CL 106  CO2 21*  GLUCOSE 273*  BUN 48*  CREATININE 1.71*  CALCIUM 9.7  AST 20  ALT 16*  ALKPHOS 60  BILITOT 1.3*   ------------------------------------------------------------------------------------------------------------------ CrCl cannot be calculated (Unknown ideal weight.). ------------------------------------------------------------------------------------------------------------------ No results for input(s): TSH, T4TOTAL, T3FREE, THYROIDAB in the last 72 hours.  Invalid input(s): FREET3   Coagulation profile No results for input(s): INR, PROTIME in the last 168 hours. ------------------------------------------------------------------------------------------------------------------- No results for input(s): DDIMER in the last 72 hours. -------------------------------------------------------------------------------------------------------------------  Cardiac Enzymes Recent Labs  Lab 01/28/17 1146  TROPONINI 0.05*   ------------------------------------------------------------------------------------------------------------------ Invalid input(s): POCBNP  ---------------------------------------------------------------------------------------------------------------  Urinalysis No results found for: COLORURINE, APPEARANCEUR, LABSPEC, PHURINE, GLUCOSEU, HGBUR, BILIRUBINUR, KETONESUR, PROTEINUR, UROBILINOGEN, NITRITE, LEUKOCYTESUR   RADIOLOGY: Dg Chest 2 View  Result Date: 01/28/2017 CLINICAL DATA:  Shortness of breath  for a few days, worsening EXAM: CHEST  2 VIEW COMPARISON:  06/12/2006 FINDINGS: Chronic mild cardiomegaly. There is diffuse interstitial opacity with Kerley lines and trace effusions. Large lung volumes with flat diaphragm. No history of infectious symptoms. No acute  osseous finding. Hernia repair over the left upper quadrant. IMPRESSION: CHF pattern. Electronically Signed   By: Monte Fantasia M.D.   On: 01/28/2017 12:22    EKG: Orders placed or performed during the hospital encounter of 01/28/17  . EKG 12-Lead  . EKG 12-Lead  . ED EKG  . ED EKG    IMPRESSION AND PLAN:  * Acute hypoxic respiratory failure   Acute congestive heart failure    No Echo.  IV lasix, I/o monitor, Fluid restriction  cardio consult.   I counseled him about fluid restriction.  * Diabetes   Keep on insulin sliding scale coverage.  * Chronic renal failure stage III   Monitor with IV Lasix use.  * Hypertension   Continue carvedilol, hold amlodipine for now.   May resume ARB once renal function is stable.  * Hypercholesterolemia   Continue simvastatin.  * Hypothyroidism   Continue levothyroxine.  All the records are reviewed and case discussed with ED provider. Management plans discussed with the patient, family and they are in agreement.  CODE STATUS: DO NOT RESUSCITATE Code Status History    This patient does not have a recorded code status. Please follow your organizational policy for patients in this situation.    Advance Directive Documentation     Most Recent Value  Type of Advance Directive  Healthcare Power of Attorney, Living will  Pre-existing out of facility DNR order (yellow form or pink MOST form)  No data  "MOST" Form in Place?  No data     Patient's wife was present in the room during the visit.  TOTAL TIME TAKING CARE OF THIS PATIENT: 50 minutes.    Vaughan Basta M.D on 01/28/2017   Between 7am to 6pm - Pager - [phone]  After 6pm go to [email or web address] - password [email or web address] [facility]  Office  [phone]  CC: Primary care physician; Jerrol Banana., MD   Note: This dictation was prepared with Dragon dictation along with smaller phrase technology. Any transcriptional errors that result from  this process are unintentional.

## 2017-01-28 NOTE — ED Triage Notes (Signed)
Pt sent over from Dr Marlan Palau office for increased shortness of breath and swelling all over.

## 2017-01-28 NOTE — Progress Notes (Signed)
Family Meeting Note  Advance Directive:yes  Today a meeting took place with the Patient and spouse.  The following clinical team members were present during this meeting:MD  The following were discussed:Patient's diagnosis: CHF, DM, Patient's progosis: Unable to determine and Goals for treatment: DNR  Additional follow-up to be provided: Cardiology  Time spent during discussion:20 minutes  Vaughan Basta, MD

## 2017-01-28 NOTE — ED Provider Notes (Signed)
[facility] Emergency Department Provider Note   ____________________________________________   First MD Initiated Contact with Patient 01/28/17 1156     (approximate)  I have reviewed the triage vital signs and the nursing notes.   HISTORY  Chief Complaint Shortness of Breath   HPI Randy Dalton is a 84 y.o. male . he reports shortness of breath for the last few days getting worse and increased swelling in both his legs. No fever no cough. Dr. [name] sent him here for evaluation.he denied any chest pain or tightness   Past Medical History:  Diagnosis Date  . Diabetes mellitus without complication ([place])   . Diverticulosis   . Hypercholesteremia   . Hypertension   . Hypothyroidism   . Wears dentures    partial top    Patient Active Problem List   Diagnosis Date Noted  . Morbid obesity ([place]) 03/17/2016  . Atherosclerosis of renal artery ([place]) 05/17/2014  . ED (erectile dysfunction) of organic origin 05/17/2014  . Essential (primary) hypertension 05/17/2014  . HLD (hyperlipidemia) 05/17/2014  . Adult hypothyroidism 05/17/2014  . Adiposity 05/17/2014  . Arthritis, degenerative 05/17/2014  . Apnea, sleep 05/17/2014  . Diabetes mellitus, type 2 ([place]) 05/17/2014  . Avitaminosis D 05/17/2014    Past Surgical History:  Procedure Laterality Date  . BACK SURGERY    . CATARACT EXTRACTION W/PHACO Left 10/24/2014   Procedure: CATARACT EXTRACTION PHACO AND INTRAOCULAR LENS PLACEMENT (IOC);  Surgeon: Ronnell Freshwater, MD;  Location: [facility];  Service: Ophthalmology;  Laterality: Left;  DIABETIC - oral meds  . COLONOSCOPY    . HERNIA REPAIR    . STOMACH SURGERY      Prior to Admission medications   Medication Sig Start Date End Date Taking? Authorizing Provider  amLODipine (NORVASC) 10 MG tablet Take 1 tablet (10 mg total) by mouth daily. 05/15/16  Yes Jerrol Banana., MD  carvedilol (COREG) 12.5 MG tablet TAKE 1 TABLET BY  MOUTH TWICE DAILY 06/27/16  Yes Jerrol Banana., MD  Cholecalciferol (VITAMIN D) 2000 UNITS tablet Take 2,000 Units by mouth daily.  11/08/10  Yes [provider]  glimepiride (AMARYL) 4 MG tablet TAKE 1 TABLET BY MOUTH EVERY DAY 12/18/16  Yes Jerrol Banana., MD  hydrALAZINE (APRESOLINE) 100 MG tablet Take 1 tablet (100 mg total) by mouth 2 (two) times daily. 12/02/16  Yes Jerrol Banana., MD  latanoprost (XALATAN) 0.005 % ophthalmic solution 1 drop at bedtime.   Yes [provider]  levothyroxine (SYNTHROID, LEVOTHROID) 175 MCG tablet TAKE 1 TABLET BY MOUTH EVERY DAY 05/02/16  Yes Jerrol Banana., MD  losartan (COZAAR) 100 MG tablet TAKE 1 TABLET BY MOUTH EVERY DAY 07/29/16  Yes Jerrol Banana., MD  Multiple Vitamin (MULTIVITAMIN) tablet Take 1 tablet by mouth daily.   Yes [provider]  simvastatin (ZOCOR) 20 MG tablet TAKE ONE TABLET BY MOUTH AT BEDTIME. 05/10/16  Yes Jerrol Banana., MD  TRADJENTA 5 MG TABS tablet TAKE ONE TABLET BY MOUTH EVERY DAY 06/17/16  Yes Jerrol Banana., MD  hydrochlorothiazide (HYDRODIURIL) 25 MG tablet TAKE 1 TABLET BY MOUTH EVERY DAY Patient not taking: Reported on 01/28/2017 08/12/15   Jerrol Banana., MD  pioglitazone (ACTOS) 15 MG tablet TAKE 1 TABLET BY MOUTH EVERY DAY Patient not taking: Reported on 01/28/2017 11/05/16   Jerrol Banana., MD  sildenafil (VIAGRA) 100 MG tablet Take 100 mg  by mouth as needed.  10/09/13   [provider]    Allergies Aspirin and Codeine  Family History  Problem Relation Age of Onset  . Hypertension Mother   . Kidney failure Mother   . Diabetes Father   . Lung cancer Brother   . Colon cancer Brother   . Cancer Brother        skin, removed  . Dementia Sister   . Dementia Sister   . Hypertension Sister     Social History Social History   Tobacco Use  . Smoking status: Never Smoker  . Smokeless tobacco: Never Used  Substance  Use Topics  . Alcohol use: No    Alcohol/week: 0.0 oz  . Drug use: No    Review of Systems  No fever/chills Eyes: No visual changes. ENT: No sore throat. Cardiovascular: Denies chest pain. Respiratory:  shortness of breath. Gastrointestinal: No abdominal pain.  No nausea, no vomiting.  No diarrhea.  No constipation. Genitourinary: Negative for dysuria. Musculoskeletal: Negative for back pain. Skin: Negative for rash. Neurological: Negative for headaches, focal weakness  ____________________________________________   PHYSICAL EXAM:  VITAL SIGNS: ED Triage Vitals  Enc Vitals Group     BP 01/28/17 1134 (!) 165/81     Pulse Rate 01/28/17 1134 74     Resp 01/28/17 1134 (!) 24     Temp --      Temp Source 01/28/17 1134 Oral     SpO2 01/28/17 1134 94 %     Weight 01/28/17 1135 231 lb (104.8 kg)     Height --      Head Circumference --      Peak Flow --      Pain Score --      Pain Loc --      Pain Edu? --      Excl. in [place]? --     Constitutional: Alert and oriented. Well appearing and in no acute distress. Eyes: Conjunctivae are normal.  Head: Atraumatic. Nose: No congestion/rhinnorhea. Mouth/Throat: Mucous membranes are moist.  Oropharynx non-erythematous. Neck: No stridor. Cardiovascular: Normal rate, regular rhythm. Grossly normal heart sounds.  Good peripheral circulation. Respiratory: Normal respiratory effort.  No retractions. Lungs decreased air movement and some wheezing Gastrointestinal: Soft and nontender. No distention. No abdominal bruits. No CVA tenderness. Musculoskeletal: No lower extremity tenderness 1+ edema.  No joint effusions. Neurologic:  Normal speech and language. No gross focal neurologic deficits are appreciated.  Skin:  Skin is warm, dry and intact. No rash noted. Psychiatric: Mood and affect are normal. Speech and behavior are normal.  ____________________________________________   LABS (all labs ordered are listed, but only abnormal  results are displayed)  Labs Reviewed  CBC WITH DIFFERENTIAL/PLATELET - Abnormal; Notable for the following components:      Result Value   WBC 12.2 (*)    Platelets 146 (*)    Neutro Abs 10.6 (*)    Lymphs Abs 0.8 (*)    All other components within normal limits  COMPREHENSIVE METABOLIC PANEL - Abnormal; Notable for the following components:   CO2 21 (*)    Glucose, Bld 273 (*)    BUN 48 (*)    Creatinine, Ser 1.71 (*)    ALT 16 (*)    Total Bilirubin 1.3 (*)    GFR calc non Af Amer 34 (*)    GFR calc Af Amer 40 (*)    All other components within normal limits  TROPONIN I - Abnormal; Notable for the  following components:   Troponin I 0.05 (*)    All other components within normal limits  BRAIN NATRIURETIC PEPTIDE   ____________________________________________  EKG  EKG read and interpreted by me shows normal sinus rhythm rate of 83 left axis occasional PVCs no acute ST-T wave changes ____________________________________________  RADIOLOGY  Dg Chest 2 View  Result Date: 01/28/2017 CLINICAL DATA:  Shortness of breath for a few days, worsening EXAM: CHEST  2 VIEW COMPARISON:  06/12/2006 FINDINGS: Chronic mild cardiomegaly. There is diffuse interstitial opacity with Kerley lines and trace effusions. Large lung volumes with flat diaphragm. No history of infectious symptoms. No acute osseous finding. Hernia repair over the left upper quadrant. IMPRESSION: CHF pattern. Electronically Signed   By: Monte Fantasia M.D.   On: 01/28/2017 12:22   chest x-ray looks most consistent with CHF ____________________________________________   PROCEDURES  Procedure(s) performed:   Procedures  Critical Care performed:   ____________________________________________   INITIAL IMPRESSION / ASSESSMENT AND PLAN / ED COURSE patient has new onset congestive heart failure with a bump in his troponin. He may have had a heart attack in the last few days. Additionally he is now requiring oxygen  to maintain his O2 sat above 90%. We will put him and is in the hospital to finish treating his congestive failure evaluate his heart further and make sure he is stable   Clinical Course as of Jan 28 1346  Tue Jan 28, 2017  1338 HCT: 42.5 [PM]    Clinical Course User Index [PM] Nena Polio, MD     ____________________________________________   FINAL CLINICAL IMPRESSION(S) / ED DIAGNOSES  Final diagnoses:  Acute systolic congestive heart failure ([place])  Hypoxia     ED Discharge Orders    None       Note:  This document was prepared using Dragon voice recognition software and may include unintentional dictation errors.    Nena Polio, MD 01/28/17 1348

## 2017-01-28 NOTE — Progress Notes (Signed)
Randy Dalton  MRN: [id number] DOB: May 30, 1927  Subjective:  HPI   The patient is an 84 year old male who presents for evaluation of swollen feet, sweating and shortness of breath.   He states he has had a little increased swelling in his feet since increasing his dose of Amlodipine.  However yesterday he and his wife noticed more swelling than the usual and also swelling in his hands that his wife noticed this morning but the patient didn't realize.  He had a little difficulty taking his ring off of the finger today and there is a definite impression on the skin from the ring.  He also noticed during the night that he has started having shortness of breath. His wife states over several weeks he has had dyspnea on exertion.  No rest problems.  No chest pain.  For 2 days they say he has had swelling in the legs.  For 1 days had weakness chills, no documented fevers.  Today he has trouble getting out of the chair.  This is very new for him. The patient does not check his glucose at home but did this morning and states it was 220.  Patient Active Problem List   Diagnosis Date Noted  . Morbid obesity ([place]) 03/17/2016  . Atherosclerosis of renal artery ([place]) 05/17/2014  . ED (erectile dysfunction) of organic origin 05/17/2014  . Essential (primary) hypertension 05/17/2014  . HLD (hyperlipidemia) 05/17/2014  . Adult hypothyroidism 05/17/2014  . Adiposity 05/17/2014  . Arthritis, degenerative 05/17/2014  . Apnea, sleep 05/17/2014  . Diabetes mellitus, type 2 ([place]) 05/17/2014  . Avitaminosis D 05/17/2014    Past Medical History:  Diagnosis Date  . Diabetes mellitus without complication ([place])   . Diverticulosis   . Hypercholesteremia   . Hypertension   . Hypothyroidism   . Wears dentures    partial top    Social History   Socioeconomic History  . Marital status: Married    Spouse name: Not on file  . Number of children: Not on file  . Years of education: Not on file  . Highest  education level: Not on file  Social Needs  . Financial resource strain: Not on file  . Food insecurity - worry: Not on file  . Food insecurity - inability: Not on file  . Transportation needs - medical: Not on file  . Transportation needs - non-medical: Not on file  Occupational History  . Not on file  Tobacco Use  . Smoking status: Never Smoker  . Smokeless tobacco: Never Used  Substance and Sexual Activity  . Alcohol use: No    Alcohol/week: 0.0 oz  . Drug use: No  . Sexual activity: Not Currently  Other Topics Concern  . Not on file  Social History Narrative  . Not on file    Outpatient Encounter Medications as of 01/28/2017  Medication Sig Note  . amLODipine (NORVASC) 10 MG tablet Take 1 tablet (10 mg total) by mouth daily.   . carvedilol (COREG) 12.5 MG tablet TAKE 1 TABLET BY MOUTH TWICE DAILY   . Cholecalciferol (VITAMIN D) 2000 UNITS tablet Take by mouth. 05/17/2014: Received from: [facility]  . glimepiride (AMARYL) 4 MG tablet TAKE 1 TABLET BY MOUTH EVERY DAY   . hydrALAZINE (APRESOLINE) 100 MG tablet Take 1 tablet (100 mg total) by mouth 2 (two) times daily.   . hydrochlorothiazide (HYDRODIURIL) 25 MG tablet TAKE 1 TABLET BY MOUTH EVERY DAY 01/28/2017: Patient states he was  taken off of this several months ago.  I do not see any note to indicate this.   Marland Kitchen latanoprost (XALATAN) 0.005 % ophthalmic solution 1 drop at bedtime.   Marland Kitchen levothyroxine (SYNTHROID, LEVOTHROID) 175 MCG tablet TAKE 1 TABLET BY MOUTH EVERY DAY   . losartan (COZAAR) 100 MG tablet TAKE 1 TABLET BY MOUTH EVERY DAY   . Multiple Vitamin (MULTIVITAMIN) tablet Take 1 tablet by mouth daily.   . pioglitazone (ACTOS) 15 MG tablet TAKE 1 TABLET BY MOUTH EVERY DAY   . sildenafil (VIAGRA) 100 MG tablet Take by mouth. 05/17/2014: Medication taken as needed.  Received from: [facility]  . simvastatin (ZOCOR) 20 MG tablet TAKE ONE TABLET BY MOUTH AT BEDTIME.   . TRADJENTA 5 MG TABS  tablet TAKE ONE TABLET BY MOUTH EVERY DAY   . [DISCONTINUED] hydrALAZINE (APRESOLINE) 50 MG tablet TAKE ONE TABLET BY MOUTH TWICE DAILY    No facility-administered encounter medications on file as of 01/28/2017.     Allergies  Allergen Reactions  . Aspirin     Feel bad  . Codeine Nausea Only    Review of Systems  Constitutional: Positive for malaise/fatigue. Negative for fever.  HENT: Negative.   Eyes: Negative.   Respiratory: Positive for shortness of breath and wheezing. Negative for cough.   Cardiovascular: Positive for leg swelling. Negative for chest pain, palpitations, orthopnea and claudication.  Gastrointestinal: Negative.   Neurological: Positive for weakness.  Endo/Heme/Allergies: Negative.   Psychiatric/Behavioral: Negative.     Objective:  BP (!) 148/70 (BP Location: Left Arm, Patient Position: Sitting, Cuff Size: Normal)   Pulse 86   Temp (!) 97.3 F (36.3 C) (Oral)   SpO2 91%   Physical Exam  Constitutional: He is oriented to person, place, and time and well-developed, well-nourished, and in no distress.  HENT:  Head: Atraumatic.  Eyes: Conjunctivae are normal. No scleral icterus.  Cardiovascular: Normal rate and regular rhythm.  Pulmonary/Chest: Effort normal.  He appears to have crackles in both bases today.  O2 sat is 91%  Abdominal: Soft.  Lymphadenopathy:    He has no cervical adenopathy.  Neurological: He is alert and oriented to person, place, and time. GCS score is 15.  Skin: Skin is warm and dry.  Psychiatric: Mood, memory, affect and judgment normal.    Assessment and Plan :  Weakness, chills--possible influenza Dyspnea on exertion which is worse--possible CHF Rule out ischemic etiology Type 2 diabetes Discontinue Actos as of today Hypertension Hyperlipidemia Obesity At this time send the patient to emergency department as I think in the big picture he needs a more formal workup than he can get her in the office.  His presentation today  is different from his normal baseline.  I have done the exam and reviewed the chart and it is accurate to the best of my knowledge. Development worker, community has been used and  any errors in dictation or transcription are unintentional. Miguel Aschoff M.D. [facility]

## 2017-01-28 NOTE — Care Management Note (Signed)
Case Management Note  Patient Details  Name: Randy Dalton MRN: [id number] Date of Birth: 1927/06/26  Subjective/Objective:   Verified with patient that he is NOT a veteran.To be admitted.                 Action/Plan:   Expected Discharge Date:                  Expected Discharge Plan:     In-House Referral:     Discharge planning Services     Post Acute Care Choice:    Choice offered to:     DME Arranged:    DME Agency:     HH Arranged:    HH Agency:     Status of Service:     If discussed at [employer] of Stay Meetings, dates discussed:    Additional Comments:  Beau Fanny, RN 01/28/2017, 1:44 PM

## 2017-01-29 ENCOUNTER — Inpatient Hospital Stay: Payer: Medicare Other

## 2017-01-29 LAB — CBC
HEMATOCRIT: 37.9 % — AB (ref 40.0–52.0)
HEMOGLOBIN: 12.6 g/dL — AB (ref 13.0–18.0)
MCH: 31.7 pg (ref 26.0–34.0)
MCHC: 33.2 g/dL (ref 32.0–36.0)
MCV: 95.5 fL (ref 80.0–100.0)
Platelets: 137 10*3/uL — ABNORMAL LOW (ref 150–440)
RBC: 3.97 MIL/uL — AB (ref 4.40–5.90)
RDW: 14 % (ref 11.5–14.5)
WBC: 8.7 10*3/uL (ref 3.8–10.6)

## 2017-01-29 LAB — GLUCOSE, CAPILLARY
GLUCOSE-CAPILLARY: 224 mg/dL — AB (ref 65–99)
Glucose-Capillary: 161 mg/dL — ABNORMAL HIGH (ref 65–99)
Glucose-Capillary: 164 mg/dL — ABNORMAL HIGH (ref 65–99)
Glucose-Capillary: 171 mg/dL — ABNORMAL HIGH (ref 65–99)

## 2017-01-29 LAB — ECHOCARDIOGRAM COMPLETE
HEIGHTINCHES: 68 in
Weight: 3750.4 oz

## 2017-01-29 LAB — BASIC METABOLIC PANEL
ANION GAP: 8 (ref 5–15)
BUN: 45 mg/dL — ABNORMAL HIGH (ref 6–20)
CHLORIDE: 109 mmol/L (ref 101–111)
CO2: 25 mmol/L (ref 22–32)
Calcium: 9.5 mg/dL (ref 8.9–10.3)
Creatinine, Ser: 1.68 mg/dL — ABNORMAL HIGH (ref 0.61–1.24)
GFR calc non Af Amer: 35 mL/min — ABNORMAL LOW (ref >60)
GFR, EST AFRICAN AMERICAN: 41 mL/min — AB (ref >60)
GLUCOSE: 155 mg/dL — AB (ref 65–99)
POTASSIUM: 3.9 mmol/L (ref 3.5–5.1)
Sodium: 142 mmol/L (ref 135–145)

## 2017-01-29 MED ORDER — IPRATROPIUM-ALBUTEROL 0.5-2.5 (3) MG/3ML IN SOLN: 3.0000 mL | Freq: Four times a day (QID) | RESPIRATORY_TRACT | Status: DC | PRN

## 2017-01-29 MED ORDER — LOSARTAN POTASSIUM 50 MG PO TABS
50.0000 mg | ORAL_TABLET | Freq: Every day | ORAL | Status: DC
  Administered 2017-01-29 – 2017-01-30 (×2): 50 mg via ORAL
  Filled 2017-01-29 (×2): qty 1

## 2017-01-29 MED ORDER — SODIUM CHLORIDE 0.9% FLUSH
3.0000 mL | Freq: Two times a day (BID) | INTRAVENOUS | Status: DC
  Administered 2017-01-29 – 2017-01-30 (×3): 3 mL via INTRAVENOUS

## 2017-01-29 NOTE — Plan of Care (Signed)
Living with heart failure booklet reviewed with patient.  Reinforcement needed.

## 2017-01-29 NOTE — Progress Notes (Signed)
Pt refused to take insulin. Doctor Anselm Jungling was notified and ordered to discontinue insulin. Will continue to monitor.

## 2017-01-29 NOTE — Progress Notes (Signed)
Inpatient Diabetes Program Recommendations  AACE/ADA: New Consensus Statement on Inpatient Glycemic Control (2015)  Target Ranges:  Prepandial:   less than 140 mg/dL      Peak postprandial:   less than 180 mg/dL (1-2 hours)      Critically ill patients:  140 - 180 mg/dL  Results for Randy Dalton, Randy Dalton (MRN [id number]) as of 01/29/2017 10:13  Ref. Range 01/28/2017 16:41 01/28/2017 20:12 01/29/2017 08:49  Glucose-Capillary Latest Ref Range: 65 - 99 mg/dL 217 (H) 233 (H) 161 (H)  Results for Randy Dalton, Randy Dalton (MRN [id number]) as of 01/29/2017 10:13  Ref. Range 07/22/2016 10:49 12/02/2016 10:46  Hemoglobin A1C Unknown 6.9 7.1    Review of Glycemic Control  Diabetes history: DM2 Outpatient Diabetes medications: Amaryl 4 mg daily, Tradjenta 5 mg daily, Actos 15 mg daily (note on home med list that pt is not taking Actos) Current orders for Inpatient glycemic control: Amaryl 4 mg daily, Tradjenta 5 mg daily, Novolog 0-9 units TID with meals  Inpatient Diabetes Program Recommendations:  Oral Agents: Noted Actos on home medication list (with note that patient is not taking) and per office note by Dr. [name] on 01/28/17, patient has been instructed to stop Actos as of 01/28/17. Please ensure Actos is discontinued as an outpatient due to side effect of fluid retention.  Thanks, Barnie Alderman, RN, MSN, CDE Diabetes Coordinator Inpatient Diabetes Program [phone] (Team Pager from 8am to 5pm)

## 2017-01-29 NOTE — Discharge Instructions (Signed)
Heart Failure Clinic appointment on February 06 2017 at 10:20am with Darylene Price, [place]. Please call [phone] to reschedule.

## 2017-01-29 NOTE — Plan of Care (Signed)
  Progressing Activity: Risk for activity intolerance will decrease 01/29/2017 1142 - Progressing by Liliane Channel, RN Pain Managment: General experience of comfort will improve 01/29/2017 1142 - Progressing by Liliane Channel, RN Safety: Ability to remain free from injury will improve 01/29/2017 1142 - Progressing by Liliane Channel, RN Activity: Capacity to carry out activities will improve 01/29/2017 1142 - Progressing by Liliane Channel, RN

## 2017-01-29 NOTE — Progress Notes (Signed)
[place] [facility] [place] NAME: Randy Dalton    MR#:  [id number]  DATE OF BIRTH:  [birth year]  SUBJECTIVE:  CHIEF COMPLAINT:   Chief Complaint  Patient presents with  . Shortness of Breath   Gave her shortness of breath and fluid overload with edema. Had good diuresis and feels slightly better today. Still on oxygen and did not walk. I encouraged him to wean the oxygen and ambulate around the room.  REVIEW OF SYSTEMS:  CONSTITUTIONAL: No fever, fatigue or weakness.  EYES: No blurred or double vision.  EARS, NOSE, AND THROAT: No tinnitus or ear pain.  RESPIRATORY: No cough, shortness of breath, wheezing or hemoptysis.  CARDIOVASCULAR: No chest pain, orthopnea, edema.  GASTROINTESTINAL: No nausea, vomiting, diarrhea or abdominal pain.  GENITOURINARY: No dysuria, hematuria.  ENDOCRINE: No polyuria, nocturia,  HEMATOLOGY: No anemia, easy bruising or bleeding SKIN: No rash or lesion. MUSCULOSKELETAL: No joint pain or arthritis.   NEUROLOGIC: No tingling, numbness, weakness.  PSYCHIATRY: No anxiety or depression.   ROS  DRUG ALLERGIES:   Allergies  Allergen Reactions  . Aspirin     Feel bad  . Codeine Nausea Only    VITALS:  Blood pressure (!) 148/67, pulse 76, temperature 97.9 F (36.6 C), temperature source Oral, resp. rate 18, height 5\' 8"  (1.727 m), weight 106.1 kg (234 lb), SpO2 97 %.  PHYSICAL EXAMINATION:  GENERAL:  84 y.o.-year-old patient lying in the bed with no acute distress.  EYES: Pupils equal, round, reactive to light and accommodation. No scleral icterus. Extraocular muscles intact.  HEENT: Head atraumatic, normocephalic. Oropharynx and nasopharynx clear.  NECK:  Supple, no jugular venous distention. No thyroid enlargement, no tenderness.  LUNGS: Normal breath sounds bilaterally, no wheezing, some crepitation. No use of accessory muscles of respiration.  CARDIOVASCULAR: S1, S2 normal. No murmurs, rubs, or gallops.   ABDOMEN: Soft, nontender, nondistended. Bowel sounds present. No organomegaly or mass.  EXTREMITIES: some pedal edema, cyanosis, or clubbing.  NEUROLOGIC: Cranial nerves II through XII are intact. Muscle strength 5/5 in all extremities. Sensation intact. Gait not checked.  PSYCHIATRIC: The patient is alert and oriented x 3.  SKIN: No obvious rash, lesion, or ulcer.   Physical Exam LABORATORY PANEL:   CBC Recent Labs  Lab 01/29/17 0350  WBC 8.7  HGB 12.6*  HCT 37.9*  PLT 137*   ------------------------------------------------------------------------------------------------------------------  Chemistries  Recent Labs  Lab 01/28/17 1146 01/29/17 0350  NA 137 142  K 4.0 3.9  CL 106 109  CO2 21* 25  GLUCOSE 273* 155*  BUN 48* 45*  CREATININE 1.71* 1.68*  CALCIUM 9.7 9.5  AST 20  --   ALT 16*  --   ALKPHOS 60  --   BILITOT 1.3*  --    ------------------------------------------------------------------------------------------------------------------  Cardiac Enzymes Recent Labs  Lab 01/28/17 1831 01/28/17 2301  TROPONINI 0.06* 0.07*   ------------------------------------------------------------------------------------------------------------------  RADIOLOGY:  Dg Chest 2 View  Result Date: 01/29/2017 CLINICAL DATA:  Shortness of Breath EXAM: CHEST  2 VIEW COMPARISON:  01/28/2017 FINDINGS: Cardiac shadow remains enlarged. Lungs are hyperinflated. Stable interstitial changes are noted consistent with mild CHF. No focal confluent infiltrate is seen. Small pleural effusions are noted. Degenerative changes of the thoracic spine are seen. IMPRESSION: Stable appearance of the chest when compared with the prior exam consistent with mild CHF. Electronically Signed   By: Inez Catalina M.D.   On: 01/29/2017 07:45   Dg Chest 2 View  Result Date: 01/28/2017  CLINICAL DATA:  Shortness of breath for a few days, worsening EXAM: CHEST  2 VIEW COMPARISON:  06/12/2006 FINDINGS: Chronic  mild cardiomegaly. There is diffuse interstitial opacity with Kerley lines and trace effusions. Large lung volumes with flat diaphragm. No history of infectious symptoms. No acute osseous finding. Hernia repair over the left upper quadrant. IMPRESSION: CHF pattern. Electronically Signed   By: Monte Fantasia M.D.   On: 01/28/2017 12:22    ASSESSMENT AND PLAN:   Principal Problem:   Acute respiratory failure with hypoxia (HCC) Active Problems:   Acute CHF (congestive heart failure) (HCC)   Acute CHF (HCC)  * Acute hypoxic respiratory failure   Acute diastolic congestive heart failure    Reviewed Echo. EF 70%  IV lasix, I/o monitor, Fluid restriction  cardio consult.- Had diuresis more than 2 L.   I counseled him about fluid restriction.  * Diabetes   Keep on insulin sliding scale coverage.  Resume home dose of Amaryl  * Chronic renal failure stage III   Monitor with IV Lasix use. Stable  * Hypertension   Continue carvedilol, hold amlodipine for now.    resume ARB as renal function is stable.  * Hypercholesterolemia   Continue simvastatin.  * Hypothyroidism   Continue levothyroxine.   All the records are reviewed and case discussed with Care Management/Social Workerr. Management plans discussed with the patient, family and they are in agreement.  CODE STATUS: Full  TOTAL TIME TAKING CARE OF THIS PATIENT: 35 minutes.   His wife was present in the room during my visit.  POSSIBLE D/C IN 1 DAYS, DEPENDING ON CLINICAL CONDITION.   Vaughan Basta M.D on 01/29/2017   Between 7am to 6pm - Pager - [phone]  After 6pm go to [email or web address] - password [email or web address] [facility]  Office  [phone]  CC: Primary care physician; Jerrol Banana., MD  Note: This dictation was prepared with Dragon dictation along with smaller phrase technology. Any transcriptional errors that result from this process are unintentional.

## 2017-01-29 NOTE — Consult Note (Signed)
Randy Dalton is a 84 y.o. male  [id number]  Primary Cardiologist: Dr. Neoma Laming Reason for Consultation: Elevated troponin, CHF  HPI: 84yo male with a known medical history of DM, hypercholesteremia, and HTN was sent to ER by Dr. [name] for evaluation of increased shortness of breath and lower extremity edema. He denied chest pain.   Review of Systems: Pt is feeling much better. Reports improvement in LE edema and shortness of breath is improving.    Past Medical History:  Diagnosis Date  . Diabetes mellitus without complication ([place])   . Diverticulosis   . Hypercholesteremia   . Hypertension   . Hypothyroidism   . Wears dentures    partial top    Medications Prior to Admission  Medication Sig Dispense Refill  . amLODipine (NORVASC) 10 MG tablet Take 1 tablet (10 mg total) by mouth daily. 90 tablet 11  . carvedilol (COREG) 12.5 MG tablet TAKE 1 TABLET BY MOUTH TWICE DAILY 60 tablet 11  . Cholecalciferol (VITAMIN D) 2000 UNITS tablet Take 2,000 Units by mouth daily.     Marland Kitchen glimepiride (AMARYL) 4 MG tablet TAKE 1 TABLET BY MOUTH EVERY DAY 90 tablet 3  . hydrALAZINE (APRESOLINE) 100 MG tablet Take 1 tablet (100 mg total) by mouth 2 (two) times daily. 180 tablet 3  . latanoprost (XALATAN) 0.005 % ophthalmic solution 1 drop at bedtime.    Marland Kitchen levothyroxine (SYNTHROID, LEVOTHROID) 175 MCG tablet TAKE 1 TABLET BY MOUTH EVERY DAY 90 tablet 3  . losartan (COZAAR) 100 MG tablet TAKE 1 TABLET BY MOUTH EVERY DAY 30 tablet 11  . Multiple Vitamin (MULTIVITAMIN) tablet Take 1 tablet by mouth daily.    . simvastatin (ZOCOR) 20 MG tablet TAKE ONE TABLET BY MOUTH AT BEDTIME. 90 tablet 3  . TRADJENTA 5 MG TABS tablet TAKE ONE TABLET BY MOUTH EVERY DAY 30 tablet 11  . hydrochlorothiazide (HYDRODIURIL) 25 MG tablet TAKE 1 TABLET BY MOUTH EVERY DAY (Patient not taking: Reported on 01/28/2017) 30 tablet 11  . pioglitazone (ACTOS) 15 MG tablet TAKE 1 TABLET BY MOUTH EVERY DAY (Patient not taking:  Reported on 01/28/2017) 30 tablet 12  . sildenafil (VIAGRA) 100 MG tablet Take 100 mg by mouth as needed.        . carvedilol  12.5 mg Oral BID  . furosemide  20 mg Intravenous Q12H  . glimepiride  4 mg Oral Daily  . heparin  5,000 Units Subcutaneous Q8H  . hydrALAZINE  100 mg Oral BID  . insulin aspart  0-9 Units Subcutaneous TID WC  . latanoprost  1 drop Both Eyes QHS  . levothyroxine  175 mcg Oral Daily  . linagliptin  5 mg Oral Daily  . multivitamin with minerals  1 tablet Oral Daily  . simvastatin  20 mg Oral QHS    Infusions:   Allergies  Allergen Reactions  . Aspirin     Feel bad  . Codeine Nausea Only    Social History   Socioeconomic History  . Marital status: Married    Spouse name: Not on file  . Number of children: Not on file  . Years of education: Not on file  . Highest education level: Not on file  Social Needs  . Financial resource strain: Not on file  . Food insecurity - worry: Not on file  . Food insecurity - inability: Not on file  . Transportation needs - medical: Not on file  . Transportation needs - non-medical: Not on  file  Occupational History  . Not on file  Tobacco Use  . Smoking status: Never Smoker  . Smokeless tobacco: Never Used  Substance and Sexual Activity  . Alcohol use: No    Alcohol/week: 0.0 oz  . Drug use: No  . Sexual activity: Not Currently  Other Topics Concern  . Not on file  Social History Narrative  . Not on file    Family History  Problem Relation Age of Onset  . Hypertension Mother   . Kidney failure Mother   . Diabetes Father   . Lung cancer Brother   . Colon cancer Brother   . Cancer Brother        skin, removed  . Dementia Sister   . Dementia Sister   . Hypertension Sister     PHYSICAL EXAM: Vitals:   01/29/17 0422 01/29/17 0812  BP: 129/67 (!) 148/67  Pulse: 70 76  Resp: 18   Temp: 98.4 F (36.9 C) 97.9 F (36.6 C)  SpO2: 98% 98%     Intake/Output Summary (Last 24 hours) at 01/29/2017  0850 Last data filed at 01/29/2017 0844 Gross per 24 hour  Intake 360 ml  Output 2910 ml  Net -2550 ml    General:  Well appearing. No respiratory difficulty HEENT: normal Neck: supple. no JVD. Carotids 2+ bilat; no bruits. No lymphadenopathy or thryomegaly appreciated. Cor: PMI nondisplaced. Regular rate & rhythm. No rubs, gallops or murmurs. Lungs: clear Abdomen: soft, nontender, nondistended. No hepatosplenomegaly. No bruits or masses. Good bowel sounds. Extremities: no cyanosis, clubbing, rash, edema Neuro: alert & oriented x 3, cranial nerves grossly intact. moves all 4 extremities w/o difficulty. Affect pleasant.  ECG: NSR 82bpm with 1st degree AV block  Results for orders placed or performed during the hospital encounter of 01/28/17 (from the past 24 hour(s))  CBC with Differential     Status: Abnormal   Collection Time: 01/28/17 11:46 AM  Result Value Ref Range   WBC 12.2 (H) 3.8 - 10.6 K/uL   RBC 4.46 4.40 - 5.90 MIL/uL   Hemoglobin 14.1 13.0 - 18.0 g/dL   HCT 42.5 40.0 - 52.0 %   MCV 95.3 80.0 - 100.0 fL   MCH 31.7 26.0 - 34.0 pg   MCHC 33.3 32.0 - 36.0 g/dL   RDW 13.6 11.5 - 14.5 %   Platelets 146 (L) 150 - 440 K/uL   Neutrophils Relative % 87 %   Neutro Abs 10.6 (H) 1.4 - 6.5 K/uL   Lymphocytes Relative 7 %   Lymphs Abs 0.8 (L) 1.0 - 3.6 K/uL   Monocytes Relative 5 %   Monocytes Absolute 0.6 0.2 - 1.0 K/uL   Eosinophils Relative 0 %   Eosinophils Absolute 0.0 0 - 0.7 K/uL   Basophils Relative 1 %   Basophils Absolute 0.1 0 - 0.1 K/uL  Comprehensive metabolic panel     Status: Abnormal   Collection Time: 01/28/17 11:46 AM  Result Value Ref Range   Sodium 137 135 - 145 mmol/L   Potassium 4.0 3.5 - 5.1 mmol/L   Chloride 106 101 - 111 mmol/L   CO2 21 (L) 22 - 32 mmol/L   Glucose, Bld 273 (H) 65 - 99 mg/dL   BUN 48 (H) 6 - 20 mg/dL   Creatinine, Ser 1.71 (H) 0.61 - 1.24 mg/dL   Calcium 9.7 8.9 - 10.3 mg/dL   Total Protein 7.8 6.5 - 8.1 g/dL   Albumin 4.4  3.5 - 5.0 g/dL  AST 20 15 - 41 U/L   ALT 16 (L) 17 - 63 U/L   Alkaline Phosphatase 60 38 - 126 U/L   Total Bilirubin 1.3 (H) 0.3 - 1.2 mg/dL   GFR calc non Af Amer 34 (L) >60 mL/min   GFR calc Af Amer 40 (L) >60 mL/min   Anion gap 10 5 - 15  Troponin I     Status: Abnormal   Collection Time: 01/28/17 11:46 AM  Result Value Ref Range   Troponin I 0.05 (HH) <0.03 ng/mL  Brain natriuretic peptide     Status: Abnormal   Collection Time: 01/28/17 11:46 AM  Result Value Ref Range   B Natriuretic Peptide 861.0 (H) 0.0 - 100.0 pg/mL  Troponin I     Status: Abnormal   Collection Time: 01/28/17  2:39 PM  Result Value Ref Range   Troponin I 0.05 (HH) <0.03 ng/mL  Glucose, capillary     Status: Abnormal   Collection Time: 01/28/17  4:41 PM  Result Value Ref Range   Glucose-Capillary 217 (H) 65 - 99 mg/dL  Troponin I     Status: Abnormal   Collection Time: 01/28/17  6:31 PM  Result Value Ref Range   Troponin I 0.06 (HH) <0.03 ng/mL  Glucose, capillary     Status: Abnormal   Collection Time: 01/28/17  8:12 PM  Result Value Ref Range   Glucose-Capillary 233 (H) 65 - 99 mg/dL  Troponin I     Status: Abnormal   Collection Time: 01/28/17 11:01 PM  Result Value Ref Range   Troponin I 0.07 (HH) <0.03 ng/mL  Basic metabolic panel     Status: Abnormal   Collection Time: 01/29/17  3:50 AM  Result Value Ref Range   Sodium 142 135 - 145 mmol/L   Potassium 3.9 3.5 - 5.1 mmol/L   Chloride 109 101 - 111 mmol/L   CO2 25 22 - 32 mmol/L   Glucose, Bld 155 (H) 65 - 99 mg/dL   BUN 45 (H) 6 - 20 mg/dL   Creatinine, Ser 1.68 (H) 0.61 - 1.24 mg/dL   Calcium 9.5 8.9 - 10.3 mg/dL   GFR calc non Af Amer 35 (L) >60 mL/min   GFR calc Af Amer 41 (L) >60 mL/min   Anion gap 8 5 - 15  CBC     Status: Abnormal   Collection Time: 01/29/17  3:50 AM  Result Value Ref Range   WBC 8.7 3.8 - 10.6 K/uL   RBC 3.97 (L) 4.40 - 5.90 MIL/uL   Hemoglobin 12.6 (L) 13.0 - 18.0 g/dL   HCT 37.9 (L) 40.0 - 52.0 %   MCV  95.5 80.0 - 100.0 fL   MCH 31.7 26.0 - 34.0 pg   MCHC 33.2 32.0 - 36.0 g/dL   RDW 14.0 11.5 - 14.5 %   Platelets 137 (L) 150 - 440 K/uL   Dg Chest 2 View  Result Date: 01/29/2017 CLINICAL DATA:  Shortness of Breath EXAM: CHEST  2 VIEW COMPARISON:  01/28/2017 FINDINGS: Cardiac shadow remains enlarged. Lungs are hyperinflated. Stable interstitial changes are noted consistent with mild CHF. No focal confluent infiltrate is seen. Small pleural effusions are noted. Degenerative changes of the thoracic spine are seen. IMPRESSION: Stable appearance of the chest when compared with the prior exam consistent with mild CHF. Electronically Signed   By: Inez Catalina M.D.   On: 01/29/2017 07:45   Dg Chest 2 View  Result Date: 01/28/2017 CLINICAL DATA:  Shortness of  breath for a few days, worsening EXAM: CHEST  2 VIEW COMPARISON:  06/12/2006 FINDINGS: Chronic mild cardiomegaly. There is diffuse interstitial opacity with Kerley lines and trace effusions. Large lung volumes with flat diaphragm. No history of infectious symptoms. No acute osseous finding. Hernia repair over the left upper quadrant. IMPRESSION: CHF pattern. Electronically Signed   By: Monte Fantasia M.D.   On: 01/28/2017 12:22     ASSESSMENT AND PLAN: Mildly elevated troponin likely due to demand ischemia with CHF pattern chest xray and elevated BNP. Elevated creatinine noted. Echo shows normal LVEF with diastolic dysfunction, increased RV pressure consistent with moderate pulmonary hypertension, mild aortic stenosis and mild mitral regurgitation. No significant ST segment changes on EKG, however no previous EKG to compare.   Advise continuing IV lasix and carvedilol. Recommend outpatient stress test and cardiology follow up once respiratory status is stable.   Jake Bathe, NP-C Cell: [phone]

## 2017-01-29 NOTE — Evaluation (Signed)
Physical Therapy Evaluation Patient Details Name: Randy Dalton MRN: [id number] DOB: 06-19-1927 Today's Date: 01/29/2017   History of Present Illness   Randy Dalton  is a 84 y.o. male with a known history of diabetes, hypercholesterolemia, hypertension- for last 1-2 days started having short of breath with minimal exertion and feeling generalized weakness. Also noted swelling on his legs and his arms. Also has some symptoms of orthopnea so decided to come to emergency room.    Clinical Impression  Pt presents to PT near baseline functional mobility and with overall improvement in SOB which continues to resolve.  Pt able to ambulate on room air for 150' with O2 sats monitored throughout dropping to 88% briefly and maintained between 89-90% for most of the time.  Pt returned to 2L nasal canula at rest.  Pt's gait is limited by his shortness of breath and his gait pattern is affected by his flat feet.  No LOB or frank gait deviations noted.  Pt has needed support at home and education on pursed lipped breathing and energy conservation completed.  No further acute PT needs at this time.    Follow Up Recommendations No PT follow up    Equipment Recommendations  None recommended by PT    Recommendations for Other Services       Precautions / Restrictions Precautions Precautions: Fall Precaution Comments: MOD Restrictions Weight Bearing Restrictions: No      Mobility  Bed Mobility Overal bed mobility: Modified Independent    General bed mobility comments: Pt sitting up upon arrival, transfers sit>supine with Mod I assist, scoots self up in bed using bed rails indpendently.  Transfers Overall transfer level: Independent Equipment used: None    General transfer comment: Pt able to stand up and sit down on bed with no device, independelty with good body mechanics and balance.  Ambulation/Gait Ambulation/Gait assistance: Modified independent (Device/Increase time) Ambulation Distance  (Feet): 150 Feet Assistive device: None Gait Pattern/deviations: Decreased step length - right;Decreased step length - left     General Gait Details: Decreaed step length and toe push off bilaterally, pt reports his flat feet caused this walking pattern.  Pt's gait is steady with increased lateral sway and increased SOB towards end of session, pt unable to continue talking while walking  Stairs     Wheelchair Mobility    Modified Rankin (Stroke Patients Only)       Balance Overall balance assessment: Modified Independent         Pertinent Vitals/Pain Pain Assessment: No/denies pain    Home Living Family/patient expects to be discharged to:: Private residence Living Arrangements: Spouse/significant other Available Help at Discharge: Family Type of Home: House Home Access: Stairs to enter Entrance Stairs-Rails: Right Entrance Stairs-Number of Steps: 2 Home Layout: One level;Laundry or work area in basement        Prior Function Level of Independence: Independent     Comments: stays busy with rental property     Hand Dominance        Extremity/Trunk Assessment   Upper Extremity Assessment Upper Extremity Assessment: Overall WFL for tasks assessed    Lower Extremity Assessment Lower Extremity Assessment: Overall WFL for tasks assessed    Cervical / Trunk Assessment Cervical / Trunk Assessment: Normal  Communication   Communication: No difficulties(Jokes around)  Cognition Arousal/Alertness: Awake/alert Behavior During Therapy: WFL for tasks assessed/performed Overall Cognitive Status: Within Functional Limits for tasks assessed       General Comments General comments (skin integrity, edema, etc.):  visible areas c/d/i    Exercises  Ambualtion in hallway, 150' without device.   Assessment/Plan    PT Assessment Patent does not need any further PT services  PT Problem List         PT Treatment Interventions      PT Goals (Current goals can be  found in the Care Plan section)  Acute Rehab PT Goals Patient Stated Goal: To go home. PT Goal Formulation: With patient Time For Goal Achievement: 02/12/17 Potential to Achieve Goals: Good    Frequency     Barriers to discharge None    Co-evaluation   NA     AM-PAC PT "6 Clicks" Daily Activity  Outcome Measure Difficulty turning over in bed (including adjusting bedclothes, sheets and blankets)?: None Difficulty moving from lying on back to sitting on the side of the bed? : None Difficulty sitting down on and standing up from a chair with arms (e.g., wheelchair, bedside commode, etc,.)?: None Help needed moving to and from a bed to chair (including a wheelchair)?: None Help needed walking in hospital room?: None Help needed climbing 3-5 steps with a railing? : None 6 Click Score: 24    End of Session   Activity Tolerance: Patient tolerated treatment well Patient left: in bed;with call bell/phone within reach;with family/visitor present Nurse Communication: Mobility status PT Visit Diagnosis: Difficulty in walking, not elsewhere classified (R26.2)    Time: 6415-8309 PT Time Calculation (min) (ACUTE ONLY): 17 min   Charges:   PT Evaluation $PT Eval Low Complexity: 1 Low     PT G Codes:        Perlita Forbush A Torian Thoennes, PT 01/29/2017, 2:11 PM

## 2017-01-29 NOTE — Progress Notes (Signed)
Pt was weaned off oxygen. His Oxygen saturation was at 95 on Room Air. Will continue to monitor.

## 2017-01-30 LAB — BASIC METABOLIC PANEL
ANION GAP: 8 (ref 5–15)
BUN: 39 mg/dL — AB (ref 6–20)
CALCIUM: 9.6 mg/dL (ref 8.9–10.3)
CO2: 25 mmol/L (ref 22–32)
Chloride: 105 mmol/L (ref 101–111)
Creatinine, Ser: 1.7 mg/dL — ABNORMAL HIGH (ref 0.61–1.24)
GFR calc Af Amer: 40 mL/min — ABNORMAL LOW (ref >60)
GFR calc non Af Amer: 34 mL/min — ABNORMAL LOW (ref >60)
GLUCOSE: 149 mg/dL — AB (ref 65–99)
POTASSIUM: 3.7 mmol/L (ref 3.5–5.1)
Sodium: 138 mmol/L (ref 135–145)

## 2017-01-30 LAB — GLUCOSE, CAPILLARY
GLUCOSE-CAPILLARY: 140 mg/dL — AB (ref 65–99)
GLUCOSE-CAPILLARY: 180 mg/dL — AB (ref 65–99)

## 2017-01-30 MED ORDER — LOSARTAN POTASSIUM 50 MG PO TABS: 50.0000 mg | ORAL_TABLET | Freq: Every day | ORAL | 0 refills | Status: DC

## 2017-01-30 MED ORDER — POTASSIUM CHLORIDE CRYS ER 20 MEQ PO TBCR
40.0000 meq | EXTENDED_RELEASE_TABLET | Freq: Once | ORAL | Status: AC
  Administered 2017-01-30: 40 meq via ORAL
  Filled 2017-01-30: qty 2

## 2017-01-30 MED ORDER — FUROSEMIDE 20 MG PO TABS: 20.0000 mg | ORAL_TABLET | Freq: Every day | ORAL | 0 refills | Status: DC | PRN

## 2017-01-30 NOTE — Progress Notes (Signed)
SUBJECTIVE: Pt is feeling much better today. Off oxygen, sitting and eating comfortably. Reports LE edema is much better.   Vitals:   01/29/17 1800 01/29/17 2022 01/30/17 0516 01/30/17 0800  BP:  (!) 153/72 (!) 156/76 (!) 145/63  Pulse:  70 65 66  Resp:  18 18 18   Temp:  97.7 F (36.5 C) 97.8 F (36.6 C) 98.2 F (36.8 C)  TempSrc:  Oral Oral Oral  SpO2: 96% 92% 96% 95%  Weight:   221 lb 11.2 oz (100.6 kg)   Height:        Intake/Output Summary (Last 24 hours) at 01/30/2017 0846 Last data filed at 01/30/2017 0708 Gross per 24 hour  Intake 840 ml  Output 2125 ml  Net -1285 ml    LABS: Basic Metabolic Panel: Recent Labs    01/29/17 0350 01/30/17 0502  NA 142 138  K 3.9 3.7  CL 109 105  CO2 25 25  GLUCOSE 155* 149*  BUN 45* 39*  CREATININE 1.68* 1.70*  CALCIUM 9.5 9.6   Liver Function Tests: Recent Labs    01/28/17 1146  AST 20  ALT 16*  ALKPHOS 60  BILITOT 1.3*  PROT 7.8  ALBUMIN 4.4   No results for input(s): LIPASE, AMYLASE in the last 72 hours. CBC: Recent Labs    01/28/17 1146 01/29/17 0350  WBC 12.2* 8.7  NEUTROABS 10.6*  --   HGB 14.1 12.6*  HCT 42.5 37.9*  MCV 95.3 95.5  PLT 146* 137*   Cardiac Enzymes: Recent Labs    01/28/17 1439 01/28/17 1831 01/28/17 2301  TROPONINI 0.05* 0.06* 0.07*   BNP: Invalid input(s): POCBNP D-Dimer: No results for input(s): DDIMER in the last 72 hours. Hemoglobin A1C: No results for input(s): HGBA1C in the last 72 hours. Fasting Lipid Panel: No results for input(s): CHOL, HDL, LDLCALC, TRIG, CHOLHDL, LDLDIRECT in the last 72 hours. Thyroid Function Tests: No results for input(s): TSH, T4TOTAL, T3FREE, THYROIDAB in the last 72 hours.  Invalid input(s): FREET3 Anemia Panel: No results for input(s): VITAMINB12, FOLATE, FERRITIN, TIBC, IRON, RETICCTPCT in the last 72 hours.   PHYSICAL EXAM General: Well developed, well nourished, in no acute distress HEENT:  Normocephalic and atramatic Neck:  No  JVD.  Lungs: Clear bilaterally to auscultation and percussion. Heart: HRRR . Normal S1 and S2 without gallops or murmurs.  Abdomen: Bowel sounds are positive, abdomen soft and non-tender  Msk:  Back normal, normal gait. Normal strength and tone for age. Extremities: Mild LE pitting edema +1.   Neuro: Alert and oriented X 3. Psych:  Good affect, responds appropriately  TELEMETRY: NSR 73bpm  ASSESSMENT AND PLAN: Acute respiratory failure secondary to diastolic CHF. Advise discharging on 40mg  oral lasix and continue carvedilol ,losartan, and potassium,  with hydralazine.  Cleared for discharge and advised close outpatient follow up to titrate diuretics and antihypertensives for optimal disease management. Given follow up appt at [place] on Monday with Dr. Humphrey Rolls. Pt verbalized understanding.   Principal Problem:   Acute respiratory failure with hypoxia ([facility]) Active Problems:   Acute CHF (congestive heart failure) ([place])   Acute CHF ([place])    Jake Bathe, NP-C 01/30/2017 8:46 AM Cell: [phone]

## 2017-01-30 NOTE — Discharge Summary (Signed)
[facility] NAME: Randy Dalton    MR#:  [id number]  DATE OF BIRTH:  [birth year]  DATE OF ADMISSION:  01/28/2017 ADMITTING PHYSICIAN: Vaughan Basta, MD  DATE OF DISCHARGE: 01/30/2017   PRIMARY CARE PHYSICIAN: Jerrol Banana., MD    ADMISSION DIAGNOSIS:  Hypoxia [R09.02] Acute CHF ([place]) [G64.4] Acute systolic congestive heart failure (HCC) [I50.21]  DISCHARGE DIAGNOSIS:  Principal Problem:   Acute respiratory failure with hypoxia (HCC) Active Problems:   Acute CHF (congestive heart failure) (HCC)   Acute CHF ([place])   SECONDARY DIAGNOSIS:   Past Medical History:  Diagnosis Date  . Diabetes mellitus without complication ([place])   . Diverticulosis   . Hypercholesteremia   . Hypertension   . Hypothyroidism   . Wears dentures    partial top    HOSPITAL COURSE:   *Acute hypoxic respiratory failure Acute diastolic congestive heart failure  Reviewed Echo. EF 70% IV lasix, I/o monitor, Fluid restriction cardio consult.- Had diuresis more than 3 L. I counseled him about fluid and salt restriction.   Follow in cardio clinic.  Advised lasix PRN on d/c, explained to wife and daughter also.  *Diabetes Keep on insulin sliding scale coverage.  Resume home dose of Amaryl   Hold pioglitazone on d/c due to renal issues and fluid overload.  *Chronic renal failure stage III Monitor with IV Lasix use. Stable  *Hypertension Continue carvedilol,hold amlodipine for now.  resume ARB as renal function is stable.  *Hypercholesterolemia Continue simvastatin.  *Hypothyroidism Continue levothyroxine.    DISCHARGE CONDITIONS:   Stable.  CONSULTS OBTAINED:  Treatment Team:  Dionisio David, MD  DRUG ALLERGIES:   Allergies  Allergen Reactions  . Aspirin     Feel bad  . Codeine Nausea Only    DISCHARGE MEDICATIONS:   Allergies as of 01/30/2017      Reactions    Aspirin    Feel bad   Codeine Nausea Only      Medication List    STOP taking these medications   hydrochlorothiazide 25 MG tablet Commonly known as:  HYDRODIURIL   pioglitazone 15 MG tablet Commonly known as:  ACTOS     TAKE these medications   amLODipine 10 MG tablet Commonly known as:  NORVASC Take 1 tablet (10 mg total) by mouth daily.   carvedilol 12.5 MG tablet Commonly known as:  COREG TAKE 1 TABLET BY MOUTH TWICE DAILY   furosemide 20 MG tablet Commonly known as:  LASIX Take 1 tablet (20 mg total) by mouth daily as needed. If Body weight is > 2 Pounds than baseline. Need to call MD , if need to take this medicine more than 2 days.   glimepiride 4 MG tablet Commonly known as:  AMARYL TAKE 1 TABLET BY MOUTH EVERY DAY   hydrALAZINE 100 MG tablet Commonly known as:  APRESOLINE Take 1 tablet (100 mg total) by mouth 2 (two) times daily.   latanoprost 0.005 % ophthalmic solution Commonly known as:  XALATAN 1 drop at bedtime.   levothyroxine 175 MCG tablet Commonly known as:  SYNTHROID, LEVOTHROID TAKE 1 TABLET BY MOUTH EVERY DAY   losartan 50 MG tablet Commonly known as:  COZAAR Take 1 tablet (50 mg total) by mouth daily. Start taking on:  01/31/2017 What changed:    medication strength  how much to take   multivitamin tablet Take 1 tablet by mouth daily.   sildenafil 100 MG tablet Commonly known  as:  VIAGRA Take 100 mg by mouth as needed.   simvastatin 20 MG tablet Commonly known as:  ZOCOR TAKE ONE TABLET BY MOUTH AT BEDTIME.   TRADJENTA 5 MG Tabs tablet Generic drug:  linagliptin TAKE ONE TABLET BY MOUTH EVERY DAY   Vitamin D 2000 units tablet Take 2,000 Units by mouth daily.        DISCHARGE INSTRUCTIONS:    Follow with cardio clinic in 1 week.  If you experience worsening of your admission symptoms, develop shortness of breath, life threatening emergency, suicidal or homicidal thoughts you must seek medical attention immediately by  calling 911 or calling your MD immediately  if symptoms less severe.  You Must read complete instructions/literature along with all the possible adverse reactions/side effects for all the Medicines you take and that have been prescribed to you. Take any new Medicines after you have completely understood and accept all the possible adverse reactions/side effects.   Please note  You were cared for by a hospitalist during your hospital stay. If you have any questions about your discharge medications or the care you received while you were in the hospital after you are discharged, you can call the unit and asked to speak with the hospitalist on call if the hospitalist that took care of you is not available. Once you are discharged, your primary care physician will handle any further medical issues. Please note that NO REFILLS for any discharge medications will be authorized once you are discharged, as it is imperative that you return to your primary care physician (or establish a relationship with a primary care physician if you do not have one) for your aftercare needs so that they can reassess your need for medications and monitor your lab values.    Today   CHIEF COMPLAINT:   Chief Complaint  Patient presents with  . Shortness of Breath    HISTORY OF PRESENT ILLNESS:  Randy Dalton  is a 84 y.o. male with a known history of diabetes, hypercholesterolemia, hypertension- for last 1-2 days started having short of breath with minimal exertion and feeling generalized weakness. Also noted swelling on his legs and his arms. Also has some symptoms of orthopnea so decided to come to emergency room. In ER noted to have acute CHF. No history of any cardiac issues so given to hospitalist team for further management   VITAL SIGNS:  Blood pressure (!) 145/63, pulse 66, temperature 98.2 F (36.8 C), temperature source Oral, resp. rate 18, height 5\' 8"  (1.727 m), weight 100.6 kg (221 lb 11.2 oz), SpO2 95  %.  I/O:    Intake/Output Summary (Last 24 hours) at 01/30/2017 1055 Last data filed at 01/30/2017 0945 Gross per 24 hour  Intake 720 ml  Output 2750 ml  Net -2030 ml    PHYSICAL EXAMINATION:   GENERAL:  84 y.o.-year-old patient lying in the bed with no acute distress.  EYES: Pupils equal, round, reactive to light and accommodation. No scleral icterus. Extraocular muscles intact.  HEENT: Head atraumatic, normocephalic. Oropharynx and nasopharynx clear.  NECK:  Supple, no jugular venous distention. No thyroid enlargement, no tenderness.  LUNGS: Normal breath sounds bilaterally, no wheezing, some crepitation. No use of accessory muscles of respiration.  CARDIOVASCULAR: S1, S2 normal. No murmurs, rubs, or gallops.  ABDOMEN: Soft, nontender, nondistended. Bowel sounds present. No organomegaly or mass.  EXTREMITIES: some pedal edema, cyanosis, or clubbing.  NEUROLOGIC: Cranial nerves II through XII are intact. Muscle strength 5/5 in all extremities. Sensation  intact. Gait not checked.  PSYCHIATRIC: The patient is alert and oriented x 3.  SKIN: No obvious rash, lesion, or ulcer.     DATA REVIEW:   CBC Recent Labs  Lab 01/29/17 0350  WBC 8.7  HGB 12.6*  HCT 37.9*  PLT 137*    Chemistries  Recent Labs  Lab 01/28/17 1146  01/30/17 0502  NA 137   < > 138  K 4.0   < > 3.7  CL 106   < > 105  CO2 21*   < > 25  GLUCOSE 273*   < > 149*  BUN 48*   < > 39*  CREATININE 1.71*   < > 1.70*  CALCIUM 9.7   < > 9.6  AST 20  --   --   ALT 16*  --   --   ALKPHOS 60  --   --   BILITOT 1.3*  --   --    < > = values in this interval not displayed.    Cardiac Enzymes Recent Labs  Lab 01/28/17 2301  TROPONINI 0.07*    Microbiology Results  No results found for this or any previous visit.  RADIOLOGY:  Dg Chest 2 View  Result Date: 01/29/2017 CLINICAL DATA:  Shortness of Breath EXAM: CHEST  2 VIEW COMPARISON:  01/28/2017 FINDINGS: Cardiac shadow remains enlarged. Lungs are  hyperinflated. Stable interstitial changes are noted consistent with mild CHF. No focal confluent infiltrate is seen. Small pleural effusions are noted. Degenerative changes of the thoracic spine are seen. IMPRESSION: Stable appearance of the chest when compared with the prior exam consistent with mild CHF. Electronically Signed   By: Inez Catalina M.D.   On: 01/29/2017 07:45   Dg Chest 2 View  Result Date: 01/28/2017 CLINICAL DATA:  Shortness of breath for a few days, worsening EXAM: CHEST  2 VIEW COMPARISON:  06/12/2006 FINDINGS: Chronic mild cardiomegaly. There is diffuse interstitial opacity with Kerley lines and trace effusions. Large lung volumes with flat diaphragm. No history of infectious symptoms. No acute osseous finding. Hernia repair over the left upper quadrant. IMPRESSION: CHF pattern. Electronically Signed   By: Monte Fantasia M.D.   On: 01/28/2017 12:22    EKG:   Orders placed or performed during the hospital encounter of 01/28/17  . EKG 12-Lead  . EKG 12-Lead  . ED EKG  . ED EKG      Management plans discussed with the patient, family and they are in agreement.  CODE STATUS:     Code Status Orders  (From admission, onward)        Start     Ordered   01/28/17 1454  Full code  Continuous     01/28/17 1453    Code Status History    Date Active Date Inactive Code Status Order ID Comments User Context   This patient has a current code status but no historical code status.    Advance Directive Documentation     Most Recent Value  Type of Advance Directive  Healthcare Power of Attorney, Living will  Pre-existing out of facility DNR order (yellow form or pink MOST form)  No data  "MOST" Form in Place?  No data      TOTAL TIME TAKING CARE OF THIS PATIENT: 35 minutes.    Vaughan Basta M.D on 01/30/2017 at 10:55 AM  Between 7am to 6pm - Pager - [phone]  After 6pm go to [email or web address] - password [email or web address]  [employer]  Office   [phone]  CC: Primary care physician; Jerrol Banana., MD   Note: This dictation was prepared with Dragon dictation along with smaller phrase technology. Any transcriptional errors that result from this process are unintentional.

## 2017-01-30 NOTE — Progress Notes (Signed)
IV and tele removed from patient. Discharge instructions given to patient. Verbalized understanding. No distress at this time. Wife at bedside will transport patient home.

## 2017-01-30 NOTE — Progress Notes (Addendum)
Patient admitted with dx of acute respiratory failure with hypoxia, acute diastolic CHF, Echo revealed an EF of 70%.  Patient has hx of diabetes, chronic renal failure Stage III, HTN, HLD, and Hypothyroidism.    CHF Education:?? Educational session with patient completed. Patient lying in bed talking to his wife and daughter.  ? ? Reviewed with patient/family "Living Better with Heart Failure" packet. Briefly reviewed definition of heart failure and signs and symptoms of an exacerbation.?Discussed the meaning of EF and his preliminary value as compared to normal value.??Explained to patient that HF is a chronic illness which requires self-assessment / self-management along with help from the cardiologist/PCP/HF Clinic.???  *Reviewed importance of and reason behind checking weight daily in the AM, after using the bathroom, but before getting dressed.?Patient has scales.? ? Reviewed the following information with patient:  *Discussed when to call the Dr= weight gain of >2-3lb overnight of 5lb in a week,  *Discussed yellow zone= call MD: weight gain of >2-3lb overnight of 5lb in a week, increased swelling, increased SOB when lying down, chest discomfort, dizziness, increased fatigue *Red Zone= call 911: struggle to breath, fainting or near fainting, significant chest pain  ? *Reviewed low sodium diet-provided handout of recommended and not recommended foods. ?Reviewed reading labels with patient. Discussed fluid intake with patient as well. Patient currently on a fluid restriction of 1200 ml daily.   *Instructed patient to take medications as prescribed for heart failure. Explained briefly why pt is on the medications (either make you feel better, live longer or keep you out of the hospital) and discussed monitoring and side effects.   *Smoking Cessation?- Patient is a NEVER smoker.? ? *Discussed the benefits of exercise.  Patient encouraged to remain as active as possible.  ?Exercise encouraged.  Encourage walking and gradually increasing to 30 minutes per day up to 150inutes per week per AHA guidelines. ?  *[facility]- Explained the role of the [facility]. ?Explained to patient/family that the HF Clinic does not replace his PCP/cardiologist, but is an additional resource to help him manage his HF and to keep him out of the hospital. ?Patient encouraged to keep this appointment. ?Appointment in the HF Clinic is scheduled for 02/06/2017 at 10:40 a.m. Patient reluctant at first stating his PCP takes care of him.  Again, explained the purpose of the [facility].  Wife and daughter encouraging patient to be seen in the [facility]. Now all are  agreeable to the plan.    Once again , I reviewed the 5 Steps to Living Better with Heart Failure.  Patient and family thanked me for providing the above information. ?  Roanna Epley, RN, BSN, [facility] Cardiovascular and Pulmonary Nurse Navigator

## 2017-02-03 ENCOUNTER — Ambulatory Visit: Payer: Self-pay | Admitting: Family Medicine

## 2017-02-03 DIAGNOSIS — R7989 Other specified abnormal findings of blood chemistry: Secondary | ICD-10-CM | POA: Diagnosis not present

## 2017-02-03 DIAGNOSIS — R0602 Shortness of breath: Secondary | ICD-10-CM | POA: Diagnosis not present

## 2017-02-03 DIAGNOSIS — I509 Heart failure, unspecified: Secondary | ICD-10-CM | POA: Diagnosis not present

## 2017-02-05 ENCOUNTER — Encounter: Payer: Self-pay | Admitting: Family Medicine

## 2017-02-05 ENCOUNTER — Ambulatory Visit (INDEPENDENT_AMBULATORY_CARE_PROVIDER_SITE_OTHER): Payer: Medicare Other | Admitting: Family Medicine

## 2017-02-05 VITALS — BP 122/60 | HR 62 | Temp 97.5°F | Resp 16 | Wt 230.0 lb

## 2017-02-05 DIAGNOSIS — I509 Heart failure, unspecified: Secondary | ICD-10-CM | POA: Diagnosis not present

## 2017-02-05 DIAGNOSIS — E119 Type 2 diabetes mellitus without complications: Secondary | ICD-10-CM

## 2017-02-05 DIAGNOSIS — Z794 Long term (current) use of insulin: Secondary | ICD-10-CM | POA: Diagnosis not present

## 2017-02-05 DIAGNOSIS — I10 Essential (primary) hypertension: Secondary | ICD-10-CM | POA: Diagnosis not present

## 2017-02-05 DIAGNOSIS — Z09 Encounter for follow-up examination after completed treatment for conditions other than malignant neoplasm: Secondary | ICD-10-CM

## 2017-02-05 NOTE — Progress Notes (Signed)
Patient: Randy Dalton Male    DOB: 01/09/1927   84 y.o.   MRN: [id number] Visit Date: 02/05/2017  Today's Provider: [name], MD   Chief Complaint  Patient presents with  . Hospitalization Follow-up  . Hypertension   Subjective:    HPI   Hsp stay: 01/28/17- 01/30/17  Admit diagnoses- hypoxia, acute CHF, acute systolic CHF.  Medication changes: stopped HCTZ and Pioglitazone  Tests done: EKG and echo  This appointment was originally for a follow up of HTN. His Hydralazine was increased to 100 mg BID.   Pt reports that he is feeling much better and a lot of swelling has resolved. He is still getting his strength back. He is scheduled to see cardiology at the heart failure clinic tomorrow and is scheduled for a stress test.      Allergies  Allergen Reactions  . Aspirin     Feel bad  . Codeine Nausea Only     Current Outpatient Medications:  .  amLODipine (NORVASC) 10 MG tablet, Take 1 tablet (10 mg total) by mouth daily., Disp: 90 tablet, Rfl: 11 .  carvedilol (COREG) 12.5 MG tablet, TAKE 1 TABLET BY MOUTH TWICE DAILY, Disp: 60 tablet, Rfl: 11 .  Cholecalciferol (VITAMIN D) 2000 UNITS tablet, Take 2,000 Units by mouth daily. , Disp: , Rfl:  .  furosemide (LASIX) 20 MG tablet, Take 1 tablet (20 mg total) by mouth daily as needed. If Body weight is > 2 Pounds than baseline. Need to call MD , if need to take this medicine more than 2 days., Disp: 30 tablet, Rfl: 0 .  glimepiride (AMARYL) 4 MG tablet, TAKE 1 TABLET BY MOUTH EVERY DAY, Disp: 90 tablet, Rfl: 3 .  hydrALAZINE (APRESOLINE) 100 MG tablet, Take 1 tablet (100 mg total) by mouth 2 (two) times daily., Disp: 180 tablet, Rfl: 3 .  latanoprost (XALATAN) 0.005 % ophthalmic solution, 1 drop at bedtime., Disp: , Rfl:  .  levothyroxine (SYNTHROID, LEVOTHROID) 175 MCG tablet, TAKE 1 TABLET BY MOUTH EVERY DAY, Disp: 90 tablet, Rfl: 3 .  losartan (COZAAR) 50 MG tablet, Take 1 tablet (50 mg total) by mouth daily.,  Disp: 30 tablet, Rfl: 0 .  Multiple Vitamin (MULTIVITAMIN) tablet, Take 1 tablet by mouth daily., Disp: , Rfl:  .  sildenafil (VIAGRA) 100 MG tablet, Take 100 mg by mouth as needed. , Disp: , Rfl:  .  simvastatin (ZOCOR) 20 MG tablet, TAKE ONE TABLET BY MOUTH AT BEDTIME., Disp: 90 tablet, Rfl: 3 .  TRADJENTA 5 MG TABS tablet, TAKE ONE TABLET BY MOUTH EVERY DAY, Disp: 30 tablet, Rfl: 11  Review of Systems  Constitutional: Negative.   HENT: Negative.   Eyes: Negative.   Respiratory: Negative.   Cardiovascular: Negative.   Gastrointestinal: Negative.   Endocrine: Negative.   Genitourinary: Negative.   Musculoskeletal: Negative.   Skin: Negative.   Allergic/Immunologic: Negative.   Neurological: Negative.   Hematological: Negative.   Psychiatric/Behavioral: Negative.     Social History   Tobacco Use  . Smoking status: Never Smoker  . Smokeless tobacco: Never Used  Substance Use Topics  . Alcohol use: No    Alcohol/week: 0.0 oz   Objective:   BP 122/60 (BP Location: Left Arm, Patient Position: Sitting, Cuff Size: Large)   Pulse 62   Temp (!) 97.5 F (36.4 C) (Oral)   Resp 16   Wt 230 lb (104.3 kg)   SpO2 98%   BMI  34.97 kg/m  Vitals:   02/05/17 1442  BP: 122/60  Pulse: 62  Resp: 16  Temp: (!) 97.5 F (36.4 C)  TempSrc: Oral  SpO2: 98%  Weight: 230 lb (104.3 kg)     Physical Exam  Constitutional: He is oriented to person, place, and time. He appears well-developed and well-nourished.  Eyes: Conjunctivae and EOM are normal. Pupils are equal, round, and reactive to light.  Neck: Normal range of motion. Neck supple.  Cardiovascular: Normal rate, regular rhythm, normal heart sounds and intact distal pulses.  Pulmonary/Chest: Effort normal and breath sounds normal.  Musculoskeletal: Normal range of motion.  Neurological: He is alert and oriented to person, place, and time. He has normal reflexes.  Skin: Skin is warm and dry.  Psychiatric: He has a normal mood and  affect. His behavior is normal. Judgment and thought content normal.        Assessment & Plan:     1. Hospital discharge follow-up CHF. Improved clinically.  2. Essential (primary) hypertension   3. Type 2 diabetes mellitus without complication, with long-term current use of insulin ([place])   4. Acute congestive heart failure, unspecified heart failure type ([place]) Pt informed, follow up at the beginning of March.     HPI, Exam, and A&P Transcribed under the direction and in the presence of Richard L. Cranford Mon, MD  Electronically Signed: Katina Dung, CMA  I have done the exam and reviewed the above chart and it is accurate to the best of my knowledge. Development worker, community has been used in this note in any air is in the dictation or transcription are unintentional.  [name], MD  [place]

## 2017-02-06 ENCOUNTER — Encounter: Payer: Self-pay | Admitting: Family

## 2017-02-06 ENCOUNTER — Ambulatory Visit: Payer: Medicare Other | Attending: Family | Admitting: Family

## 2017-02-06 VITALS — BP 126/52 | HR 58 | Resp 18 | Ht 68.0 in | Wt 225.1 lb

## 2017-02-06 DIAGNOSIS — N183 Chronic kidney disease, stage 3 (moderate): Secondary | ICD-10-CM

## 2017-02-06 DIAGNOSIS — E7800 Pure hypercholesterolemia, unspecified: Secondary | ICD-10-CM | POA: Insufficient documentation

## 2017-02-06 DIAGNOSIS — Z800 Family history of malignant neoplasm of digestive organs: Secondary | ICD-10-CM | POA: Insufficient documentation

## 2017-02-06 DIAGNOSIS — Z886 Allergy status to analgesic agent status: Secondary | ICD-10-CM | POA: Insufficient documentation

## 2017-02-06 DIAGNOSIS — Z9842 Cataract extraction status, left eye: Secondary | ICD-10-CM | POA: Insufficient documentation

## 2017-02-06 DIAGNOSIS — E119 Type 2 diabetes mellitus without complications: Secondary | ICD-10-CM | POA: Insufficient documentation

## 2017-02-06 DIAGNOSIS — G4733 Obstructive sleep apnea (adult) (pediatric): Secondary | ICD-10-CM | POA: Diagnosis not present

## 2017-02-06 DIAGNOSIS — I5032 Chronic diastolic (congestive) heart failure: Secondary | ICD-10-CM

## 2017-02-06 DIAGNOSIS — I110 Hypertensive heart disease with heart failure: Secondary | ICD-10-CM | POA: Insufficient documentation

## 2017-02-06 DIAGNOSIS — I509 Heart failure, unspecified: Secondary | ICD-10-CM | POA: Diagnosis present

## 2017-02-06 DIAGNOSIS — Z79890 Hormone replacement therapy (postmenopausal): Secondary | ICD-10-CM | POA: Diagnosis not present

## 2017-02-06 DIAGNOSIS — Z961 Presence of intraocular lens: Secondary | ICD-10-CM | POA: Insufficient documentation

## 2017-02-06 DIAGNOSIS — Z801 Family history of malignant neoplasm of trachea, bronchus and lung: Secondary | ICD-10-CM | POA: Insufficient documentation

## 2017-02-06 DIAGNOSIS — E1122 Type 2 diabetes mellitus with diabetic chronic kidney disease: Secondary | ICD-10-CM

## 2017-02-06 DIAGNOSIS — Z833 Family history of diabetes mellitus: Secondary | ICD-10-CM | POA: Insufficient documentation

## 2017-02-06 DIAGNOSIS — Z79899 Other long term (current) drug therapy: Secondary | ICD-10-CM | POA: Diagnosis not present

## 2017-02-06 DIAGNOSIS — I10 Essential (primary) hypertension: Secondary | ICD-10-CM

## 2017-02-06 DIAGNOSIS — Z8249 Family history of ischemic heart disease and other diseases of the circulatory system: Secondary | ICD-10-CM | POA: Diagnosis not present

## 2017-02-06 DIAGNOSIS — Z885 Allergy status to narcotic agent status: Secondary | ICD-10-CM | POA: Diagnosis not present

## 2017-02-06 DIAGNOSIS — Z7984 Long term (current) use of oral hypoglycemic drugs: Secondary | ICD-10-CM | POA: Insufficient documentation

## 2017-02-06 DIAGNOSIS — E039 Hypothyroidism, unspecified: Secondary | ICD-10-CM | POA: Diagnosis not present

## 2017-02-06 LAB — GLUCOSE, CAPILLARY: Glucose-Capillary: 270 mg/dL — ABNORMAL HIGH (ref 65–99)

## 2017-02-06 NOTE — Progress Notes (Signed)
Patient ID: Randy Dalton, male    DOB: 06-Apr-1927, 84 y.o.   MRN: [id number]  HPI  Randy Dalton is an 84 y/o male with a history of DM, hyperlipidemia, HTN, thyroid disease, obstructive sleep apnea (CPAP) diverticulosis and chronic heart failure.   Echo report from 01/28/17 reviewed and showed an EF of 70% along with trivial AR, mild Randy, mild AS and mildly elevated PA pressure of 40 mm Hg.   Admitted 01/28/17 due to acute heart failure. Cardiology consult obtained. Initially needed IV diuretics and then transitioned to oral diuretics. Discharged after 2 days.   He presents today for his initial visit with a chief complaint of minimal shortness of breath upon moderate exertion. He says this has been chronic in nature having been present for several years. He has associated fatigue along with this. He denies any chest pain, cough, wheezing, edema, palpitations, abdominal distention, dizziness, difficulty sleeping or weight gain.   Past Medical History:  Diagnosis Date  . CHF (congestive heart failure) ([place])   . Diabetes mellitus without complication ([place])   . Diverticulosis   . Hypercholesteremia   . Hypertension   . Hypothyroidism   . Obstructive sleep apnea   . Wears dentures    partial top   Past Surgical History:  Procedure Laterality Date  . BACK SURGERY    . CATARACT EXTRACTION W/PHACO Left 10/24/2014   Procedure: CATARACT EXTRACTION PHACO AND INTRAOCULAR LENS PLACEMENT (IOC);  Surgeon: Ronnell Freshwater, MD;  Location: [place];  Service: Ophthalmology;  Laterality: Left;  DIABETIC - oral meds  . COLONOSCOPY    . HERNIA REPAIR    . STOMACH SURGERY     Family History  Problem Relation Age of Onset  . Hypertension Mother   . Kidney failure Mother   . Diabetes Father   . Lung cancer Brother   . Colon cancer Brother   . Cancer Brother        skin, removed  . Dementia Sister   . Dementia Sister   . Hypertension Sister    Social History   Tobacco Use  .  Smoking status: Never Smoker  . Smokeless tobacco: Never Used  Substance Use Topics  . Alcohol use: No    Alcohol/week: 0.0 oz   Allergies  Allergen Reactions  . Aspirin     Feel bad  . Codeine Nausea Only   Prior to Admission medications   Medication Sig Start Date End Date Taking? Authorizing Provider  amLODipine (NORVASC) 10 MG tablet Take 1 tablet (10 mg total) by mouth daily. 05/15/16  Yes Jerrol Banana., MD  carvedilol (COREG) 12.5 MG tablet TAKE 1 TABLET BY MOUTH TWICE DAILY 06/27/16  Yes Jerrol Banana., MD  Cholecalciferol (VITAMIN D) 2000 UNITS tablet Take 2,000 Units by mouth daily.  11/08/10  Yes [provider]  furosemide (LASIX) 20 MG tablet Take 1 tablet (20 mg total) by mouth daily as needed. If Body weight is > 2 Pounds than baseline. Need to call MD , if need to take this medicine more than 2 days. 01/30/17 01/30/18 Yes Vaughan Basta, MD  glimepiride (AMARYL) 4 MG tablet TAKE 1 TABLET BY MOUTH EVERY DAY 12/18/16  Yes Jerrol Banana., MD  hydrALAZINE (APRESOLINE) 100 MG tablet Take 1 tablet (100 mg total) by mouth 2 (two) times daily. 12/02/16  Yes Jerrol Banana., MD  latanoprost (XALATAN) 0.005 % ophthalmic solution 1 drop at bedtime.   Yes [provider]  levothyroxine (SYNTHROID, LEVOTHROID) 175 MCG tablet TAKE 1 TABLET BY MOUTH EVERY DAY 05/02/16  Yes Jerrol Banana., MD  losartan (COZAAR) 50 MG tablet Take 1 tablet (50 mg total) by mouth daily. 01/31/17  Yes Vaughan Basta, MD  Multiple Vitamin (MULTIVITAMIN) tablet Take 1 tablet by mouth daily.   Yes [provider]  potassium chloride SA (K-DUR,KLOR-CON) 20 MEQ tablet Take 1 tablet by mouth daily. 02/03/17  Yes [provider]  sildenafil (VIAGRA) 100 MG tablet Take 100 mg by mouth as needed.  10/09/13  Yes [provider]  simvastatin (ZOCOR) 20 MG tablet TAKE ONE TABLET BY MOUTH AT BEDTIME. 05/10/16  Yes Jerrol Banana.,  MD  TRADJENTA 5 MG TABS tablet TAKE ONE TABLET BY MOUTH EVERY DAY 06/17/16  Yes Jerrol Banana., MD    Review of Systems  Constitutional: Positive for fatigue. Negative for appetite change.  HENT: Negative for congestion, postnasal drip and sore throat.   Eyes: Negative.   Respiratory: Positive for shortness of breath. Negative for cough and chest tightness.   Cardiovascular: Negative for chest pain, palpitations and leg swelling.  Gastrointestinal: Negative for abdominal distention and abdominal pain.  Endocrine: Negative.   Genitourinary: Negative.   Musculoskeletal: Negative for back pain and neck pain.  Skin: Negative.   Allergic/Immunologic: Negative.   Neurological: Negative for dizziness and light-headedness.  Hematological: Negative for adenopathy. Does not bruise/bleed easily.  Psychiatric/Behavioral: Negative for dysphoric mood and sleep disturbance (wearing CPAP; waiting on a new mask). The patient is not nervous/anxious.    Vitals:   02/06/17 1017  BP: (!) 126/52  Pulse: (!) 58  Resp: 18  SpO2: 99%  Weight: 225 lb 2 oz (102.1 kg)  Height: 5\' 8"  (1.727 m)   Wt Readings from Last 3 Encounters:  02/06/17 225 lb 2 oz (102.1 kg)  02/05/17 230 lb (104.3 kg)  01/30/17 221 lb 11.2 oz (100.6 kg)   Lab Results  Component Value Date   CREATININE 1.70 (H) 01/30/2017   CREATININE 1.68 (H) 01/29/2017   CREATININE 1.71 (H) 01/28/2017   Physical Exam  Constitutional: He is oriented to person, place, and time. He appears well-developed and well-nourished.  HENT:  Head: Normocephalic and atraumatic.  Neck: Normal range of motion. Neck supple. No JVD present.  Cardiovascular: Regular rhythm. Bradycardia present.  Pulmonary/Chest: Effort normal. He has no wheezes. He has no rales.  Abdominal: Soft. He exhibits no distension. There is no tenderness.  Musculoskeletal: He exhibits no edema or tenderness.  Neurological: He is alert and oriented to person, place, and time.   Skin: Skin is warm and dry.  Psychiatric: He has a normal mood and affect. His behavior is normal. Thought content normal.  Nursing note and vitals reviewed.  Assessment & Plan:  1: Chronic heart failure with preserved ejection fraction- - NYHA class II - euvolemic today - weighing daily and he says that his weight has been stable. Instructed to call for an overnight weight gain of >2 pounds or a weekly weight gain of >5 pounds - not adding salt to his food and has been trying to read food labels. Discussed the importance of keeping daily sodium intake to 2000mg  daily. Written dietary information was given to him about this. Low sodium cookbook was also provided. - sees cardiology Humphrey Rolls) - has a stress test scheduled on 02/19/17 - BNP 01/28/17 was 861.0 - patient reports receiving his flu vaccine for this season  2: Diabetes- -  nonfasting glucose in clinic was 270 (patient had raisin bran, orange and milk) - A1c 12/02/16 was 7.1%  3: HTN- - BP looks good today - saw PCP [name]) 02/05/17 - BMP from 01/30/17 reviewed and showed sodium 138, potassium 3.7 and GFR 34  Patient did not bring his medications nor a list. Each medication was verbally reviewed with the patient and he was encouraged to bring the bottles to every visit to confirm accuracy of list.  Return in 1 month or sooner for any questions/problems before then.

## 2017-02-06 NOTE — Patient Instructions (Signed)
Continue weighing daily and call for an overnight weight gain of > 2 pounds or a weekly weight gain of >5 pounds. 

## 2017-02-07 ENCOUNTER — Encounter: Payer: Self-pay | Admitting: Family

## 2017-02-07 DIAGNOSIS — I5032 Chronic diastolic (congestive) heart failure: Secondary | ICD-10-CM | POA: Insufficient documentation

## 2017-02-19 DIAGNOSIS — R079 Chest pain, unspecified: Secondary | ICD-10-CM | POA: Diagnosis not present

## 2017-02-21 DIAGNOSIS — R601 Generalized edema: Secondary | ICD-10-CM | POA: Diagnosis not present

## 2017-02-21 DIAGNOSIS — I509 Heart failure, unspecified: Secondary | ICD-10-CM | POA: Diagnosis not present

## 2017-03-02 ENCOUNTER — Other Ambulatory Visit: Payer: Self-pay | Admitting: Internal Medicine

## 2017-03-06 ENCOUNTER — Ambulatory Visit: Payer: Medicare Other | Admitting: Family

## 2017-03-10 ENCOUNTER — Ambulatory Visit (INDEPENDENT_AMBULATORY_CARE_PROVIDER_SITE_OTHER): Payer: Medicare Other | Admitting: Family Medicine

## 2017-03-10 ENCOUNTER — Other Ambulatory Visit: Payer: Self-pay

## 2017-03-10 ENCOUNTER — Encounter: Payer: Self-pay | Admitting: Family

## 2017-03-10 ENCOUNTER — Ambulatory Visit: Payer: Medicare Other | Attending: Family | Admitting: Family

## 2017-03-10 VITALS — BP 136/53 | HR 64 | Resp 18 | Ht 68.0 in | Wt 218.5 lb

## 2017-03-10 VITALS — BP 112/62 | HR 76 | Temp 97.5°F | Resp 16 | Wt 222.0 lb

## 2017-03-10 DIAGNOSIS — G4733 Obstructive sleep apnea (adult) (pediatric): Secondary | ICD-10-CM | POA: Insufficient documentation

## 2017-03-10 DIAGNOSIS — N183 Chronic kidney disease, stage 3 unspecified: Secondary | ICD-10-CM

## 2017-03-10 DIAGNOSIS — Z7984 Long term (current) use of oral hypoglycemic drugs: Secondary | ICD-10-CM | POA: Insufficient documentation

## 2017-03-10 DIAGNOSIS — E119 Type 2 diabetes mellitus without complications: Secondary | ICD-10-CM | POA: Insufficient documentation

## 2017-03-10 DIAGNOSIS — E039 Hypothyroidism, unspecified: Secondary | ICD-10-CM

## 2017-03-10 DIAGNOSIS — Z886 Allergy status to analgesic agent status: Secondary | ICD-10-CM | POA: Diagnosis not present

## 2017-03-10 DIAGNOSIS — I110 Hypertensive heart disease with heart failure: Secondary | ICD-10-CM | POA: Insufficient documentation

## 2017-03-10 DIAGNOSIS — Z79899 Other long term (current) drug therapy: Secondary | ICD-10-CM | POA: Diagnosis not present

## 2017-03-10 DIAGNOSIS — I5032 Chronic diastolic (congestive) heart failure: Secondary | ICD-10-CM | POA: Insufficient documentation

## 2017-03-10 DIAGNOSIS — E1122 Type 2 diabetes mellitus with diabetic chronic kidney disease: Secondary | ICD-10-CM | POA: Diagnosis not present

## 2017-03-10 DIAGNOSIS — R0609 Other forms of dyspnea: Secondary | ICD-10-CM | POA: Insufficient documentation

## 2017-03-10 DIAGNOSIS — I10 Essential (primary) hypertension: Secondary | ICD-10-CM

## 2017-03-10 DIAGNOSIS — E782 Mixed hyperlipidemia: Secondary | ICD-10-CM | POA: Diagnosis not present

## 2017-03-10 DIAGNOSIS — E7800 Pure hypercholesterolemia, unspecified: Secondary | ICD-10-CM | POA: Diagnosis not present

## 2017-03-10 DIAGNOSIS — K5790 Diverticulosis of intestine, part unspecified, without perforation or abscess without bleeding: Secondary | ICD-10-CM | POA: Insufficient documentation

## 2017-03-10 NOTE — Patient Instructions (Signed)
Continue weighing daily and call for an overnight weight gain of > 2 pounds or a weekly weight gain of >5 pounds. 

## 2017-03-10 NOTE — Progress Notes (Signed)
Randy Dalton  MRN: [id number] DOB: [birth year]  Subjective:  HPI   The patient is an 84 year old male who presents for follow up of his chronic disease.  He was last seen on 02/05/17 for his hospitalization follow up.  The patient was seen today by cardiology also.   He reports that he feels well.  He states that his swelling has improved.  He has not been checking his glucose except on a rare occasion.    Patient Active Problem List   Diagnosis Date Noted  . Chronic diastolic heart failure ([place]) 02/07/2017  . Morbid obesity ([place]) 03/17/2016  . Atherosclerosis of renal artery ([place]) 05/17/2014  . ED (erectile dysfunction) of organic origin 05/17/2014  . Essential (primary) hypertension 05/17/2014  . HLD (hyperlipidemia) 05/17/2014  . Adult hypothyroidism 05/17/2014  . Adiposity 05/17/2014  . Arthritis, degenerative 05/17/2014  . Apnea, sleep 05/17/2014  . Diabetes mellitus, type 2 ([place]) 05/17/2014  . Avitaminosis D 05/17/2014    Past Medical History:  Diagnosis Date  . CHF (congestive heart failure) ([place])   . Diabetes mellitus without complication ([place])   . Diverticulosis   . Hypercholesteremia   . Hypertension   . Hypothyroidism   . Obstructive sleep apnea   . Wears dentures    partial top    Social History   Socioeconomic History  . Marital status: Married    Spouse name: Not on file  . Number of children: Not on file  . Years of education: Not on file  . Highest education level: High school graduate  Social Needs  . Financial resource strain: Somewhat hard  . Food insecurity - worry: Never true  . Food insecurity - inability: Never true  . Transportation needs - medical: No  . Transportation needs - non-medical: No  Occupational History  . Not on file  Tobacco Use  . Smoking status: Never Smoker  . Smokeless tobacco: Never Used  Substance and Sexual Activity  . Alcohol use: No    Alcohol/week: 0.0 oz  . Drug use: No  . Sexual activity: Not Currently    Other Topics Concern  . Not on file  Social History Narrative  . Not on file    Outpatient Encounter Medications as of 03/10/2017  Medication Sig  . amLODipine (NORVASC) 10 MG tablet Take 1 tablet (10 mg total) by mouth daily.  . carvedilol (COREG) 12.5 MG tablet TAKE 1 TABLET BY MOUTH TWICE DAILY  . Cholecalciferol (VITAMIN D) 2000 UNITS tablet Take 2,000 Units by mouth daily.   Marland Kitchen glimepiride (AMARYL) 4 MG tablet TAKE 1 TABLET BY MOUTH EVERY DAY  . hydrALAZINE (APRESOLINE) 100 MG tablet Take 1 tablet (100 mg total) by mouth 2 (two) times daily.  Marland Kitchen latanoprost (XALATAN) 0.005 % ophthalmic solution 1 drop at bedtime.  Marland Kitchen levothyroxine (SYNTHROID, LEVOTHROID) 175 MCG tablet TAKE 1 TABLET BY MOUTH EVERY DAY  . losartan (COZAAR) 50 MG tablet Take 1 tablet (50 mg total) by mouth daily.  . Multiple Vitamin (MULTIVITAMIN) tablet Take 1 tablet by mouth daily.  . potassium chloride SA (K-DUR,KLOR-CON) 20 MEQ tablet Take 1 tablet by mouth daily.  . sildenafil (VIAGRA) 100 MG tablet Take 100 mg by mouth as needed.   . simvastatin (ZOCOR) 20 MG tablet TAKE ONE TABLET BY MOUTH AT BEDTIME.  Marland Kitchen torsemide (DEMADEX) 20 MG tablet Take 20 mg by mouth daily.  . TRADJENTA 5 MG TABS tablet TAKE ONE TABLET BY MOUTH EVERY DAY   No facility-administered  encounter medications on file as of 03/10/2017.     Allergies  Allergen Reactions  . Aspirin     Feel bad  . Codeine Nausea Only    Review of Systems  Constitutional: Negative for fever and malaise/fatigue.  Eyes: Negative.   Respiratory: Positive for shortness of breath (improving). Negative for cough and wheezing.   Cardiovascular: Positive for leg swelling. Negative for chest pain, palpitations, orthopnea and claudication.  Gastrointestinal: Negative.   Skin: Negative.   Neurological: Negative for weakness.  Endo/Heme/Allergies: Negative.   Psychiatric/Behavioral: Negative.     Objective:  BP 112/62 (BP Location: Right Arm, Patient Position:  Sitting, Cuff Size: Normal)   Pulse 76   Temp (!) 97.5 F (36.4 C) (Oral)   Resp 16   Wt 222 lb (100.7 kg)   BMI 33.75 kg/m   Physical Exam  Constitutional: He is oriented to person, place, and time and well-developed, well-nourished, and in no distress.  HENT:  Head: Normocephalic and atraumatic.  Right Ear: External ear normal.  Left Ear: External ear normal.  Nose: Nose normal.  Eyes: Conjunctivae are normal.  Neck: No thyromegaly present.  Cardiovascular: Normal rate, regular rhythm and normal heart sounds.  Pulmonary/Chest: Effort normal and breath sounds normal.  Abdominal: Soft.  Neurological: He is alert and oriented to person, place, and time.  Skin: Skin is warm and dry.  Psychiatric: Mood, memory, affect and judgment normal.    Assessment and Plan :  1. Essential (primary) hypertension  - CBC with Differential/Platelet - Comprehensive metabolic panel - TSH  2. Adult hypothyroidism  - Comprehensive metabolic panel - TSH  3. Mixed hyperlipidemia  - Comprehensive metabolic panel - Lipid Panel With LDL/HDL Ratio  4. Type 2 diabetes mellitus with stage 3 chronic kidney disease, without long-term current use of insulin (HCC)  - Comprehensive metabolic panel - Hemoglobin A1c - Microalbumin / creatinine urine ratio 5.CHF Pt seen at CHF clinic today.  I have done the exam and reviewed the chart and it is accurate to the best of my knowledge. Development worker, community has been used and  any errors in dictation or transcription are unintentional. Miguel Aschoff M.D. [facility]

## 2017-03-10 NOTE — Progress Notes (Signed)
Patient ID: Randy Randy, male    DOB: 10/01/1927, 84 y.o.   MRN: [id number]  HPI  Mr Randy Randy is an 84 y/o male with a history of DM, hyperlipidemia, HTN, thyroid disease, obstructive sleep apnea (CPAP) diverticulosis and chronic heart failure.   Echo report from 01/28/17 reviewed and showed an EF of 70% along with trivial AR, mild MR, mild AS and mildly elevated PA pressure of 40 mm Hg.   Admitted 01/28/17 due to acute heart failure. Cardiology consult obtained. Initially needed IV diuretics and then transitioned to oral diuretics. Discharged after 2 days.   Randy Randy presents today for a follow-up visit with a chief complaint of minimal shortness of breath upon moderate exertion. Randy Randy says that this has been present for several years with varying levels of severity. Randy Randy has associated fatigue along with this. Randy Randy denies any cough, chest pain, edema, palpitations, abdominal distention, dizziness or weight gain. Has gotten his new CPAP mask.   Past Medical History:  Diagnosis Date  . CHF (congestive heart failure) ([place])   . Diabetes mellitus without complication ([place])   . Diverticulosis   . Hypercholesteremia   . Hypertension   . Hypothyroidism   . Obstructive sleep apnea   . Wears dentures    partial top   Past Surgical History:  Procedure Laterality Date  . BACK SURGERY    . CATARACT EXTRACTION W/PHACO Left 10/24/2014   Procedure: CATARACT EXTRACTION PHACO AND INTRAOCULAR LENS PLACEMENT (IOC);  Surgeon: Ronnell Freshwater, MD;  Location: [place];  Service: Ophthalmology;  Laterality: Left;  DIABETIC - oral meds  . COLONOSCOPY    . HERNIA REPAIR    . STOMACH SURGERY     Family History  Problem Relation Age of Onset  . Hypertension Mother   . Kidney failure Mother   . Diabetes Father   . Lung cancer Brother   . Colon cancer Brother   . Cancer Brother        skin, removed  . Dementia Sister   . Dementia Sister   . Hypertension Sister    Social History   Tobacco Use   . Smoking status: Never Smoker  . Smokeless tobacco: Never Used  Substance Use Topics  . Alcohol use: No    Alcohol/week: 0.0 oz   Allergies  Allergen Reactions  . Aspirin     Feel bad  . Codeine Nausea Only   Prior to Admission medications   Medication Sig Start Date End Date Taking? Authorizing Provider  amLODipine (NORVASC) 10 MG tablet Take 1 tablet (10 mg total) by mouth daily. 05/15/16  Yes Jerrol Banana., MD  carvedilol (COREG) 12.5 MG tablet TAKE 1 TABLET BY MOUTH TWICE DAILY 06/27/16  Yes Jerrol Banana., MD  Cholecalciferol (VITAMIN D) 2000 UNITS tablet Take 2,000 Units by mouth daily.  11/08/10  Yes [provider]  glimepiride (AMARYL) 4 MG tablet TAKE 1 TABLET BY MOUTH EVERY DAY 12/18/16  Yes Jerrol Banana., MD  hydrALAZINE (APRESOLINE) 100 MG tablet Take 1 tablet (100 mg total) by mouth 2 (two) times daily. 12/02/16  Yes Jerrol Banana., MD  latanoprost (XALATAN) 0.005 % ophthalmic solution 1 drop at bedtime.   Yes [provider]  levothyroxine (SYNTHROID, LEVOTHROID) 175 MCG tablet TAKE 1 TABLET BY MOUTH EVERY DAY 05/02/16  Yes Jerrol Banana., MD  losartan (COZAAR) 50 MG tablet Take 1 tablet (50 mg total) by mouth daily. 01/31/17  Yes Vachhani,  Rosalio Macadamia, MD  Multiple Vitamin (MULTIVITAMIN) tablet Take 1 tablet by mouth daily.   Yes [provider]  potassium chloride SA (K-DUR,KLOR-CON) 20 MEQ tablet Take 1 tablet by mouth daily. 02/03/17  Yes [provider]  sildenafil (VIAGRA) 100 MG tablet Take 100 mg by mouth as needed.  10/09/13  Yes [provider]  simvastatin (ZOCOR) 20 MG tablet TAKE ONE TABLET BY MOUTH AT BEDTIME. 05/10/16  Yes Jerrol Banana., MD  TRADJENTA 5 MG TABS tablet TAKE ONE TABLET BY MOUTH EVERY DAY 06/17/16  Yes Jerrol Banana., MD  torsemide [facility]) 20 MG tablet Take 20 mg by mouth daily. 02/21/17   [provider]    Review of Systems   Constitutional: Positive for fatigue. Negative for appetite change.  HENT: Negative for congestion, postnasal drip and sore throat.   Eyes: Negative.   Respiratory: Positive for shortness of breath. Negative for cough and chest tightness.   Cardiovascular: Negative for chest pain, palpitations and leg swelling.  Gastrointestinal: Negative for abdominal distention and abdominal pain.  Endocrine: Negative.   Genitourinary: Negative.   Musculoskeletal: Negative for back pain and neck pain.  Skin: Negative.   Allergic/Immunologic: Negative.   Neurological: Negative for dizziness and light-headedness.  Hematological: Negative for adenopathy. Does not bruise/bleed easily.  Psychiatric/Behavioral: Negative for dysphoric mood and sleep disturbance (wearing CPAP). The patient is not nervous/anxious.    Vitals:   03/10/17 1103  BP: (!) 136/53  Pulse: 64  Resp: 18  SpO2: 100%  Weight: 218 lb 8 oz (99.1 kg)  Height: 5\' 8"  (1.727 m)   Wt Readings from Last 3 Encounters:  03/10/17 218 lb 8 oz (99.1 kg)  02/06/17 225 lb 2 oz (102.1 kg)  02/05/17 230 lb (104.3 kg)   Lab Results  Component Value Date   CREATININE 1.70 (H) 01/30/2017   CREATININE 1.68 (H) 01/29/2017   CREATININE 1.71 (H) 01/28/2017    Physical Exam  Constitutional: Randy Randy is oriented to person, place, and time. Randy Randy appears well-developed and well-nourished.  HENT:  Head: Normocephalic and atraumatic.  Neck: Normal range of motion. Neck supple. No JVD present.  Cardiovascular: Regular rhythm. Bradycardia present.  Pulmonary/Chest: Effort normal. Randy Randy has no wheezes. Randy Randy has no rales.  Abdominal: Soft. Randy Randy exhibits no distension. There is no tenderness.  Musculoskeletal: Randy Randy exhibits no edema or tenderness.  Neurological: Randy Randy is alert and oriented to person, place, and time.  Skin: Skin is warm and dry.  Psychiatric: Randy Randy has a normal mood and affect. His behavior is normal. Thought content normal.  Nursing note and vitals  reviewed.  Assessment & Plan:  1: Chronic heart failure with preserved ejection fraction- - NYHA class II - euvolemic today - weighing daily and Randy Randy says that his weight has gradually declined. Instructed to call for an overnight weight gain of >2 pounds or a weekly weight gain of >5 pounds - weight down 7 pounds since 02/06/17 - not adding salt to his food and has been trying to read food labels. Has been trying to closely follow a 2000mg  sodium diet and admits that Randy Randy doesn't think food tastes good anymore since Randy Randy's not using salt. Says that Randy Randy doesn't like the original Mrs. Dash. Encouraged him to try some other Mrs. Dash seasonings to find one that Randy Randy likes. - sees cardiology Randy Randy) - did a stress test on 02/19/17 & says that Randy Randy passed it - BNP 01/28/17 was 861.0 - patient reports receiving his flu vaccine for  this season  2: Diabetes- - last time Randy Randy checked it, it was "good" - encouraged him to check it more frequently - A1c 12/02/16 was 7.1%  3: HTN- - BP looks good today - saw PCP Randy Randy) 02/05/17 & returns later today - BMP from 01/30/17 reviewed and showed sodium 138, potassium 3.7 and GFR 34  Patient did not bring his medications nor a list. Each medication was verbally reviewed with the patient and Randy Randy was encouraged to bring the bottles to every visit to confirm accuracy of list.  Return in 6 months or sooner for any questions/problems before then.

## 2017-03-11 DIAGNOSIS — E782 Mixed hyperlipidemia: Secondary | ICD-10-CM | POA: Diagnosis not present

## 2017-03-11 DIAGNOSIS — E1122 Type 2 diabetes mellitus with diabetic chronic kidney disease: Secondary | ICD-10-CM | POA: Diagnosis not present

## 2017-03-11 DIAGNOSIS — E039 Hypothyroidism, unspecified: Secondary | ICD-10-CM | POA: Diagnosis not present

## 2017-03-11 DIAGNOSIS — N183 Chronic kidney disease, stage 3 (moderate): Secondary | ICD-10-CM | POA: Diagnosis not present

## 2017-03-11 DIAGNOSIS — I10 Essential (primary) hypertension: Secondary | ICD-10-CM | POA: Diagnosis not present

## 2017-03-12 ENCOUNTER — Other Ambulatory Visit: Payer: Self-pay

## 2017-03-12 ENCOUNTER — Encounter: Payer: Self-pay | Admitting: Emergency Medicine

## 2017-03-12 ENCOUNTER — Emergency Department
Admission: EM | Admit: 2017-03-12 | Discharge: 2017-03-12 | Disposition: A | Discharge status: Home / Self Care | Payer: Medicare Other | Attending: Emergency Medicine | Admitting: Emergency Medicine

## 2017-03-12 DIAGNOSIS — I5032 Chronic diastolic (congestive) heart failure: Secondary | ICD-10-CM | POA: Insufficient documentation

## 2017-03-12 DIAGNOSIS — R799 Abnormal finding of blood chemistry, unspecified: Secondary | ICD-10-CM | POA: Diagnosis not present

## 2017-03-12 DIAGNOSIS — R7989 Other specified abnormal findings of blood chemistry: Secondary | ICD-10-CM | POA: Insufficient documentation

## 2017-03-12 DIAGNOSIS — E119 Type 2 diabetes mellitus without complications: Secondary | ICD-10-CM | POA: Insufficient documentation

## 2017-03-12 DIAGNOSIS — N189 Chronic kidney disease, unspecified: Secondary | ICD-10-CM | POA: Diagnosis not present

## 2017-03-12 DIAGNOSIS — Z79899 Other long term (current) drug therapy: Secondary | ICD-10-CM | POA: Insufficient documentation

## 2017-03-12 DIAGNOSIS — E039 Hypothyroidism, unspecified: Secondary | ICD-10-CM | POA: Insufficient documentation

## 2017-03-12 DIAGNOSIS — N289 Disorder of kidney and ureter, unspecified: Secondary | ICD-10-CM

## 2017-03-12 DIAGNOSIS — E875 Hyperkalemia: Secondary | ICD-10-CM

## 2017-03-12 DIAGNOSIS — R899 Unspecified abnormal finding in specimens from other organs, systems and tissues: Secondary | ICD-10-CM

## 2017-03-12 DIAGNOSIS — I110 Hypertensive heart disease with heart failure: Secondary | ICD-10-CM | POA: Diagnosis not present

## 2017-03-12 LAB — BASIC METABOLIC PANEL
ANION GAP: 13 (ref 5–15)
BUN: 85 mg/dL — AB (ref 6–20)
CHLORIDE: 103 mmol/L (ref 101–111)
CO2: 24 mmol/L (ref 22–32)
Calcium: 9.5 mg/dL (ref 8.9–10.3)
Creatinine, Ser: 2.93 mg/dL — ABNORMAL HIGH (ref 0.61–1.24)
GFR, EST AFRICAN AMERICAN: 21 mL/min — AB (ref >60)
GFR, EST NON AFRICAN AMERICAN: 18 mL/min — AB (ref >60)
Glucose, Bld: 153 mg/dL — ABNORMAL HIGH (ref 65–99)
POTASSIUM: 3.6 mmol/L (ref 3.5–5.1)
SODIUM: 140 mmol/L (ref 135–145)

## 2017-03-12 LAB — LIPID PANEL WITH LDL/HDL RATIO
CHOLESTEROL TOTAL: 146 mg/dL (ref 100–199)
HDL: 32 mg/dL — AB (ref >39)
LDL Calculated: 74 mg/dL (ref 0–99)
LDl/HDL Ratio: 2.3 ratio (ref 0.0–3.6)
TRIGLYCERIDES: 202 mg/dL — AB (ref 0–149)
VLDL CHOLESTEROL CAL: 40 mg/dL (ref 5–40)

## 2017-03-12 LAB — COMPREHENSIVE METABOLIC PANEL
ALK PHOS: 38 IU/L — AB (ref 39–117)
ALT: 12 IU/L (ref 0–44)
AST: 13 IU/L (ref 0–40)
Albumin/Globulin Ratio: 1.6 (ref 1.2–2.2)
Albumin: 4.6 g/dL (ref 3.5–4.7)
BUN/Creatinine Ratio: 31 — ABNORMAL HIGH (ref 10–24)
BUN: 79 mg/dL — AB (ref 8–27)
Bilirubin Total: 0.5 mg/dL (ref 0.0–1.2)
CALCIUM: 4.2 mg/dL — AB (ref 8.6–10.2)
CO2: 21 mmol/L (ref 20–29)
CREATININE: 2.52 mg/dL — AB (ref 0.76–1.27)
Chloride: 97 mmol/L (ref 96–106)
GFR calc Af Amer: 25 mL/min/{1.73_m2} — ABNORMAL LOW (ref >59)
GFR, EST NON AFRICAN AMERICAN: 22 mL/min/{1.73_m2} — AB (ref >59)
GLOBULIN, TOTAL: 2.9 g/dL (ref 1.5–4.5)
GLUCOSE: 173 mg/dL — AB (ref 65–99)
Potassium: 9 mmol/L (ref 3.5–5.2)
SODIUM: 142 mmol/L (ref 134–144)
Total Protein: 7.5 g/dL (ref 6.0–8.5)

## 2017-03-12 LAB — CBC WITH DIFFERENTIAL/PLATELET
BASOS ABS: 0 10*3/uL (ref 0.0–0.2)
Basos: 1 %
EOS (ABSOLUTE): 0.7 10*3/uL — ABNORMAL HIGH (ref 0.0–0.4)
EOS: 8 %
HEMATOCRIT: 42.9 % (ref 37.5–51.0)
Hemoglobin: 14.3 g/dL (ref 13.0–17.7)
IMMATURE GRANULOCYTES: 0 %
Immature Grans (Abs): 0 10*3/uL (ref 0.0–0.1)
Lymphocytes Absolute: 2 10*3/uL (ref 0.7–3.1)
Lymphs: 25 %
MCH: 31.3 pg (ref 26.6–33.0)
MCHC: 33.3 g/dL (ref 31.5–35.7)
MCV: 94 fL (ref 79–97)
MONOCYTES: 9 %
MONOS ABS: 0.7 10*3/uL (ref 0.1–0.9)
NEUTROS PCT: 57 %
Neutrophils Absolute: 4.5 10*3/uL (ref 1.4–7.0)
Platelets: 165 10*3/uL (ref 150–379)
RBC: 4.57 x10E6/uL (ref 4.14–5.80)
RDW: 13.7 % (ref 12.3–15.4)
WBC: 7.9 10*3/uL (ref 3.4–10.8)

## 2017-03-12 LAB — HEMOGLOBIN A1C
ESTIMATED AVERAGE GLUCOSE: 154 mg/dL
HEMOGLOBIN A1C: 7 % — AB (ref 4.8–5.6)

## 2017-03-12 LAB — TSH: TSH: 0.603 u[IU]/mL (ref 0.450–4.500)

## 2017-03-12 LAB — MICROALBUMIN / CREATININE URINE RATIO
Creatinine, Urine: 35.3 mg/dL
Microalb/Creat Ratio: 138.8 mg/g creat — ABNORMAL HIGH (ref 0.0–30.0)
Microalbumin, Urine: 49 ug/mL

## 2017-03-12 MED ORDER — SODIUM CHLORIDE 0.9 % IV BOLUS (SEPSIS)
1000.0000 mL | Freq: Once | INTRAVENOUS | Status: AC
  Administered 2017-03-12: 1000 mL via INTRAVENOUS

## 2017-03-12 NOTE — Discharge Instructions (Signed)
Your potassium was measured at 3.6.  Your creatinine though was elevated.  Please ensure that you are taking in adequate fluid hydration.  Please follow back up with your primary care physician.

## 2017-03-12 NOTE — ED Triage Notes (Addendum)
Pt ambulating to triage with steady gait after he was awoken by his MD office and told to come to ED for abnormal lab value taken 03/10/17. Pt was told his potassium was "extreemly high". Pt states he is feeling fine with no complaints at this time.

## 2017-03-12 NOTE — ED Notes (Signed)
Patient woken by lab at 3:10am and told to him to hospital because of high potassium

## 2017-03-12 NOTE — ED Provider Notes (Signed)
[facility] Emergency Department Provider Note   ____________________________________________   First MD Initiated Contact with Patient 03/12/17 0405     (approximate)  I have reviewed the triage vital signs and the nursing notes.   HISTORY  Chief Complaint Abnormal Lab    HPI Randy Dalton is a 84 y.o. male who comes into the hospital today with abnormal lab work.  The patient states that he was called at 315 by his doctor and was told that his potassium was extremely elevated.  He was told he needed to come into the emergency department immediately.  The patient saw his doctor on March 4 and had blood work drawn on the morning of March 5.  He just received the results back tonight.  The patient states that he was feeling okay until he was woken up out of sleep.  He had no chest pain no nausea no vomiting or dizziness no lightheadedness.  The patient states that he just wants to get back home.  Past Medical History:  Diagnosis Date  . CHF (congestive heart failure) ([place])   . Diabetes mellitus without complication ([place])   . Diverticulosis   . Hypercholesteremia   . Hypertension   . Hypothyroidism   . Obstructive sleep apnea   . Wears dentures    partial top    Patient Active Problem List   Diagnosis Date Noted  . Chronic diastolic heart failure ([place]) 02/07/2017  . Morbid obesity ([place]) 03/17/2016  . Atherosclerosis of renal artery ([place]) 05/17/2014  . ED (erectile dysfunction) of organic origin 05/17/2014  . Essential (primary) hypertension 05/17/2014  . HLD (hyperlipidemia) 05/17/2014  . Adult hypothyroidism 05/17/2014  . Adiposity 05/17/2014  . Arthritis, degenerative 05/17/2014  . Apnea, sleep 05/17/2014  . Diabetes mellitus, type 2 ([place]) 05/17/2014  . Avitaminosis D 05/17/2014    Past Surgical History:  Procedure Laterality Date  . BACK SURGERY    . CATARACT EXTRACTION W/PHACO Left 10/24/2014   Procedure: CATARACT EXTRACTION PHACO AND  INTRAOCULAR LENS PLACEMENT (IOC);  Surgeon: Ronnell Freshwater, MD;  Location: [place] [facility];  Service: Ophthalmology;  Laterality: Left;  DIABETIC - oral meds  . COLONOSCOPY    . HERNIA REPAIR    . STOMACH SURGERY      Prior to Admission medications   Medication Sig Start Date End Date Taking? Authorizing Provider  amLODipine (NORVASC) 10 MG tablet Take 1 tablet (10 mg total) by mouth daily. 05/15/16   Jerrol Banana., MD  carvedilol (COREG) 12.5 MG tablet TAKE 1 TABLET BY MOUTH TWICE DAILY 06/27/16   Jerrol Banana., MD  Cholecalciferol (VITAMIN D) 2000 UNITS tablet Take 2,000 Units by mouth daily.  11/08/10   [provider]  glimepiride (AMARYL) 4 MG tablet TAKE 1 TABLET BY MOUTH EVERY DAY 12/18/16   Jerrol Banana., MD  hydrALAZINE (APRESOLINE) 100 MG tablet Take 1 tablet (100 mg total) by mouth 2 (two) times daily. 12/02/16   Jerrol Banana., MD  latanoprost (XALATAN) 0.005 % ophthalmic solution 1 drop at bedtime.    [provider]  levothyroxine (SYNTHROID, LEVOTHROID) 175 MCG tablet TAKE 1 TABLET BY MOUTH EVERY DAY 05/02/16   Jerrol Banana., MD  losartan (COZAAR) 50 MG tablet Take 1 tablet (50 mg total) by mouth daily. 01/31/17   Vaughan Basta, MD  Multiple Vitamin (MULTIVITAMIN) tablet Take 1 tablet by mouth daily.    [provider]  potassium chloride SA (K-DUR,KLOR-CON)  20 MEQ tablet Take 1 tablet by mouth daily. 02/03/17   [provider]  sildenafil (VIAGRA) 100 MG tablet Take 100 mg by mouth as needed.  10/09/13   [provider]  simvastatin (ZOCOR) 20 MG tablet TAKE ONE TABLET BY MOUTH AT BEDTIME. 05/10/16   Jerrol Banana., MD  torsemide (DEMADEX) 20 MG tablet Take 20 mg by mouth daily. 02/21/17   [provider]  TRADJENTA 5 MG TABS tablet TAKE ONE TABLET BY MOUTH EVERY DAY 06/17/16   Jerrol Banana., MD    Allergies Aspirin and Codeine  Family History    Problem Relation Age of Onset  . Hypertension Mother   . Kidney failure Mother   . Diabetes Father   . Lung cancer Brother   . Colon cancer Brother   . Cancer Brother        skin, removed  . Dementia Sister   . Dementia Sister   . Hypertension Sister     Social History Social History   Tobacco Use  . Smoking status: Never Smoker  . Smokeless tobacco: Never Used  Substance Use Topics  . Alcohol use: No    Alcohol/week: 0.0 oz  . Drug use: No    Review of Systems  Constitutional: No fever/chills Eyes: No visual changes. ENT: No sore throat. Cardiovascular: Denies chest pain. Respiratory: Denies shortness of breath. Gastrointestinal: No abdominal pain.  No nausea, no vomiting.  No diarrhea.  No constipation. Genitourinary: Negative for dysuria. Musculoskeletal: Negative for back pain. Skin: Negative for rash. Neurological: Negative for headaches, focal weakness or numbness.   ____________________________________________   PHYSICAL EXAM:  VITAL SIGNS: ED Triage Vitals [03/12/17 0354]  Enc Vitals Group     BP      Pulse      Resp      Temp      Temp src      SpO2      Weight 212 lb (96.2 kg)     Height 5\' 8"  (1.727 m)     Head Circumference      Peak Flow      Pain Score      Pain Loc      Pain Edu?      Excl. in [place]?     Constitutional: Alert and oriented. Well appearing and in no acute distress. Eyes: Conjunctivae are normal. PERRL. EOMI. Head: Atraumatic. Nose: No congestion/rhinnorhea. Mouth/Throat: Mucous membranes are moist.  Oropharynx non-erythematous. Cardiovascular: Normal rate, regular rhythm. Grossly normal heart sounds.  Good peripheral circulation. Respiratory: Normal respiratory effort.  No retractions. Lungs CTAB. Gastrointestinal: Soft and nontender. No distention. Positive bowel sounds Musculoskeletal: No lower extremity tenderness nor edema.   Neurologic:  Normal speech and language. Skin:  Skin is warm, dry and intact.   Psychiatric: Mood and affect are normal.   ____________________________________________   LABS (all labs ordered are listed, but only abnormal results are displayed)  Labs Reviewed  BASIC METABOLIC PANEL - Abnormal; Notable for the following components:      Result Value   Glucose, Bld 153 (*)    BUN 85 (*)    Creatinine, Ser 2.93 (*)    GFR calc non Af Amer 18 (*)    GFR calc Af Amer 21 (*)    All other components within normal limits   ____________________________________________  EKG  none ____________________________________________  RADIOLOGY  ED MD interpretation:  none  Official radiology report(s): No results found.  ____________________________________________   PROCEDURES  Procedure(s) performed: None  Procedures  Critical Care performed: No  ____________________________________________   INITIAL IMPRESSION / ASSESSMENT AND PLAN / ED COURSE  As part of my medical decision making, I reviewed the following data within the electronic MEDICAL RECORD NUMBER Notes from prior ED visits and [place] Controlled Substance Database   This is an 84 year old male who comes into the hospital today with an abnormal potassium which was drawn at his doctor's office yesterday.  My differential diagnosis is hyperkalemia versus erroneous lab work.  Since the patient is not having any complaints we will redraw a BMP and determine the patient's potassium level in the emergency department.    The patient's potassium returned at 3.6 however the patient's BUN was 85 and his creatinine was 2.93.  I did give the patient a liter of normal saline.  I discussed his fluid status and the patient's wife states that he has not been drinking much as he is supposed to restrict some of his fluid intake.  I informed him that although he is supposed to restrict fluids he does need to make sure he is taking in adequate hydration to protect his kidneys.  The patient still continues to feel well.   He will be discharged home to follow-up with his primary care physician.  ____________________________________________   FINAL CLINICAL IMPRESSION(S) / ED DIAGNOSES  Final diagnoses:  Abnormal laboratory test result  Renal insufficiency     ED Discharge Orders    None       Note:  This document was prepared using Dragon voice recognition software and may include unintentional dictation errors.    Loney Hering, MD 03/12/17 [phone]

## 2017-03-13 DIAGNOSIS — E875 Hyperkalemia: Secondary | ICD-10-CM | POA: Diagnosis not present

## 2017-03-14 LAB — RENAL FUNCTION PANEL
Albumin: 4.3 g/dL (ref 3.5–4.7)
BUN/Creatinine Ratio: 26 — ABNORMAL HIGH (ref 10–24)
BUN: 67 mg/dL — ABNORMAL HIGH (ref 8–27)
CALCIUM: 10.1 mg/dL (ref 8.6–10.2)
CHLORIDE: 102 mmol/L (ref 96–106)
CO2: 23 mmol/L (ref 20–29)
Creatinine, Ser: 2.59 mg/dL — ABNORMAL HIGH (ref 0.76–1.27)
GFR calc Af Amer: 25 mL/min/{1.73_m2} — ABNORMAL LOW (ref >59)
GFR calc non Af Amer: 21 mL/min/{1.73_m2} — ABNORMAL LOW (ref >59)
GLUCOSE: 152 mg/dL — AB (ref 65–99)
PHOSPHORUS: 3.9 mg/dL (ref 2.5–4.5)
POTASSIUM: 4.2 mmol/L (ref 3.5–5.2)
SODIUM: 143 mmol/L (ref 134–144)

## 2017-03-14 NOTE — Progress Notes (Signed)
Advised  ED 

## 2017-03-21 DIAGNOSIS — R601 Generalized edema: Secondary | ICD-10-CM | POA: Diagnosis not present

## 2017-03-21 DIAGNOSIS — I509 Heart failure, unspecified: Secondary | ICD-10-CM | POA: Diagnosis not present

## 2017-03-21 DIAGNOSIS — I2510 Atherosclerotic heart disease of native coronary artery without angina pectoris: Secondary | ICD-10-CM | POA: Diagnosis not present

## 2017-04-24 ENCOUNTER — Telehealth: Payer: Self-pay

## 2017-04-24 NOTE — Telephone Encounter (Signed)
Called pt to schedule AWV. Pt has a f/u on 06/17/17. Was going to offer to change that to a CPE since he is currently due. LMTCB. -MM

## 2017-04-28 NOTE — Telephone Encounter (Signed)
Scheduled AWV & CPE for 06/16/17 @ 920 and 10 AM.  -MM

## 2017-05-07 DIAGNOSIS — H40053 Ocular hypertension, bilateral: Secondary | ICD-10-CM | POA: Diagnosis not present

## 2017-05-07 DIAGNOSIS — H2511 Age-related nuclear cataract, right eye: Secondary | ICD-10-CM | POA: Diagnosis not present

## 2017-05-07 LAB — HM DIABETES EYE EXAM

## 2017-06-03 DIAGNOSIS — H2511 Age-related nuclear cataract, right eye: Secondary | ICD-10-CM | POA: Diagnosis not present

## 2017-06-10 ENCOUNTER — Encounter: Payer: Self-pay | Admitting: *Deleted

## 2017-06-16 ENCOUNTER — Ambulatory Visit (INDEPENDENT_AMBULATORY_CARE_PROVIDER_SITE_OTHER): Payer: Medicare Other

## 2017-06-16 ENCOUNTER — Ambulatory Visit (INDEPENDENT_AMBULATORY_CARE_PROVIDER_SITE_OTHER): Payer: Medicare Other | Admitting: Family Medicine

## 2017-06-16 VITALS — BP 126/58 | HR 69 | Temp 98.0°F | Ht 68.0 in | Wt 216.2 lb

## 2017-06-16 VITALS — BP 126/58 | HR 70 | Temp 98.0°F | Resp 16 | Wt 216.0 lb

## 2017-06-16 DIAGNOSIS — I10 Essential (primary) hypertension: Secondary | ICD-10-CM

## 2017-06-16 DIAGNOSIS — E1122 Type 2 diabetes mellitus with diabetic chronic kidney disease: Secondary | ICD-10-CM

## 2017-06-16 DIAGNOSIS — E782 Mixed hyperlipidemia: Secondary | ICD-10-CM

## 2017-06-16 DIAGNOSIS — Z0000 Encounter for general adult medical examination without abnormal findings: Secondary | ICD-10-CM

## 2017-06-16 DIAGNOSIS — E039 Hypothyroidism, unspecified: Secondary | ICD-10-CM

## 2017-06-16 DIAGNOSIS — N183 Chronic kidney disease, stage 3 (moderate): Secondary | ICD-10-CM | POA: Diagnosis not present

## 2017-06-16 NOTE — Progress Notes (Signed)
Randy Dalton  MRN: [id number] DOB: August 01, 1927  Subjective:  HPI   The patient is an 84 year old male who presents for follow up of chronic health.  He was last seen earlier today with the nurse health advisor.  Overall he feels well. Hypertension BP Readings from Last 3 Encounters:  06/16/17 (!) 126/58  06/16/17 (!) 126/58  03/12/17 112/68    Diabetes Lab Results  Component Value Date   HGBA1C 7.0 (H) 03/11/2017    Hypothyroidism Lab Results  Component Value Date   TSH 0.603 03/11/2017    Hypercholesterolemia Lab Results  Component Value Date   CHOL 146 03/11/2017   HDL 32 (L) 03/11/2017   LDLCALC 74 03/11/2017   TRIG 202 (H) 03/11/2017    Patient Active Problem List   Diagnosis Date Noted  . Chronic diastolic heart failure ([place]) 02/07/2017  . Morbid obesity ([place]) 03/17/2016  . Atherosclerosis of renal artery ([place]) 05/17/2014  . ED (erectile dysfunction) of organic origin 05/17/2014  . Essential (primary) hypertension 05/17/2014  . HLD (hyperlipidemia) 05/17/2014  . Adult hypothyroidism 05/17/2014  . Adiposity 05/17/2014  . Arthritis, degenerative 05/17/2014  . Apnea, sleep 05/17/2014  . Diabetes mellitus, type 2 ([place]) 05/17/2014  . Avitaminosis D 05/17/2014    Past Medical History:  Diagnosis Date  . CHF (congestive heart failure) ([place])   . Chronic kidney disease   . Diabetes mellitus without complication ([place])   . Diverticulosis   . GERD (gastroesophageal reflux disease)   . Hypercholesteremia   . Hypertension   . Hypothyroidism   . Obstructive sleep apnea    CPAP  . Wears dentures    partial top    Social History   Socioeconomic History  . Marital status: Married    Spouse name: Not on file  . Number of children: 1  . Years of education: Not on file  . Highest education level: High school graduate  Occupational History  . Occupation: retired  Scientific laboratory technician  . Financial resource strain: Somewhat hard  . Food insecurity:    Worry:  Never true    Inability: Never true  . Transportation needs:    Medical: No    Non-medical: No  Tobacco Use  . Smoking status: Never Smoker  . Smokeless tobacco: Never Used  Substance and Sexual Activity  . Alcohol use: No    Alcohol/week: 0.0 oz  . Drug use: No  . Sexual activity: Not Currently  Lifestyle  . Physical activity:    Days per week: 0 days    Minutes per session: 0 min  . Stress: Not at all  Relationships  . Social connections:    Talks on phone: Once a week    Gets together: Once a week    Attends religious service: More than 4 times per year    Active member of club or organization: Yes    Attends meetings of clubs or organizations: More than 4 times per year    Relationship status: Married  . Intimate partner violence:    Fear of current or ex partner: Patient refused    Emotionally abused: Patient refused    Physically abused: Patient refused    Forced sexual activity: Patient refused  Other Topics Concern  . Not on file  Social History Narrative  . Not on file    Outpatient Encounter Medications as of 06/16/2017  Medication Sig  . amLODipine (NORVASC) 10 MG tablet Take 1 tablet (10 mg total) by mouth daily.  Marland Kitchen  carvedilol (COREG) 12.5 MG tablet TAKE 1 TABLET BY MOUTH TWICE DAILY  . Cholecalciferol (VITAMIN D) 2000 UNITS tablet Take 2,000 Units by mouth daily.   Marland Kitchen glimepiride (AMARYL) 4 MG tablet TAKE 1 TABLET BY MOUTH EVERY DAY  . hydrALAZINE (APRESOLINE) 100 MG tablet Take 1 tablet (100 mg total) by mouth 2 (two) times daily.  Marland Kitchen levothyroxine (SYNTHROID, LEVOTHROID) 175 MCG tablet TAKE 1 TABLET BY MOUTH EVERY DAY  . losartan (COZAAR) 50 MG tablet Take 1 tablet (50 mg total) by mouth daily.  . Multiple Vitamin (MULTIVITAMIN) tablet Take 1 tablet by mouth daily.  . potassium chloride SA (K-DUR,KLOR-CON) 20 MEQ tablet Take 1 tablet by mouth every other day.   . sildenafil (VIAGRA) 100 MG tablet Take 100 mg by mouth as needed.   . simvastatin (ZOCOR) 20  MG tablet TAKE ONE TABLET BY MOUTH AT BEDTIME.  Marland Kitchen torsemide (DEMADEX) 20 MG tablet Take 20 mg by mouth daily.  . TRADJENTA 5 MG TABS tablet TAKE ONE TABLET BY MOUTH EVERY DAY   No facility-administered encounter medications on file as of 06/16/2017.     Allergies  Allergen Reactions  . Aspirin     Feel bad  . Codeine Nausea Only    Review of Systems  Constitutional: Negative.   HENT: Negative.   Eyes: Negative.   Respiratory: Negative.        Apnea   Cardiovascular: Negative.   Gastrointestinal: Negative.   Genitourinary: Negative.   Musculoskeletal: Negative.   Skin: Negative.   Neurological: Negative.   Endo/Heme/Allergies: Negative.   Psychiatric/Behavioral: Negative.     Objective:  BP (!) 126/58   Pulse 70   Temp 98 F (36.7 C) (Oral)   Resp 16   Wt 216 lb (98 kg)   BMI 32.84 kg/m   Physical Exam  Constitutional: He is oriented to person, place, and time and well-developed, well-nourished, and in no distress.  Obese WM NAD.  HENT:  Head: Normocephalic and atraumatic.  Right Ear: External ear normal.  Left Ear: External ear normal.  Nose: Nose normal.  Mouth/Throat: Oropharynx is clear and moist.  Eyes: Conjunctivae are normal. No scleral icterus.  Neck: No thyromegaly present.  Cardiovascular: Normal rate, regular rhythm, normal heart sounds and intact distal pulses.  Pulmonary/Chest: Effort normal and breath sounds normal.  Abdominal: Soft.  Musculoskeletal:  Trace-1+ LE edema.  Lymphadenopathy:    He has no cervical adenopathy.  Neurological: He is alert and oriented to person, place, and time. Gait normal. GCS score is 15.  Skin: Skin is warm and dry.  Psychiatric: Mood, memory, affect and judgment normal.    Assessment and Plan :  1. Essential (primary) hypertension  - Comprehensive metabolic panel  2. Adult hypothyroidism   3. Type 2 diabetes mellitus with stage 3 chronic kidney disease, without long-term current use of insulin  (HCC)  - Hemoglobin A1c  4. Mixed hyperlipidemia 5.Obesity 6.OSA  I have done the exam and reviewed the chart and it is accurate to the best of my knowledge. Development worker, community has been used and  any errors in dictation or transcription are unintentional. Miguel Aschoff M.D. [facility]

## 2017-06-16 NOTE — Patient Instructions (Addendum)
Randy Dalton , Thank you for taking time to come for your Medicare Wellness Visit. I appreciate your ongoing commitment to your health goals. Please review the following plan we discussed and let me know if I can assist you in the future.   Screening recommendations/referrals: Colonoscopy: N/A Recommended yearly ophthalmology/optometry visit for glaucoma screening and checkup Recommended yearly dental visit for hygiene and checkup  Vaccinations: Influenza vaccine: Up to date Pneumococcal vaccine: Up to date Tdap vaccine: Up to date Shingles vaccine: Pt declines today.     Advanced directives: Advance directive discussed with you today. Even though you declined this today please call our office should you change your mind and we can give you the proper paperwork for you to fill out.  Conditions/risks identified: Obesity- recommend to continue current diet plan of cutting out all fatty foods to help aid in weight loss.   Next appointment: 10:00 AM with Dr [name].   Preventive Care 82 Years and Older, Male Preventive care refers to lifestyle choices and visits with your health care provider that can promote health and wellness. What does preventive care include?  A yearly physical exam. This is also called an annual well check.  Dental exams once or twice a year.  Routine eye exams. Ask your health care provider how often you should have your eyes checked.  Personal lifestyle choices, including:  Daily care of your teeth and gums.  Regular physical activity.  Eating a healthy diet.  Avoiding tobacco and drug use.  Limiting alcohol use.  Practicing safe sex.  Taking low doses of aspirin every day.  Taking vitamin and mineral supplements as recommended by your health care provider. What happens during an annual well check? The services and screenings done by your health care provider during your annual well check will depend on your age, overall health, lifestyle risk  factors, and family history of disease. Counseling  Your health care provider may ask you questions about your:  Alcohol use.  Tobacco use.  Drug use.  Emotional well-being.  Home and relationship well-being.  Sexual activity.  Eating habits.  History of falls.  Memory and ability to understand (cognition).  Work and work Statistician. Screening  You may have the following tests or measurements:  Height, weight, and BMI.  Blood pressure.  Lipid and cholesterol levels. These may be checked every 5 years, or more frequently if you are over 10 years old.  Skin check.  Lung cancer screening. You may have this screening every year starting at age 74 if you have a 30-pack-year history of smoking and currently smoke or have quit within the past 15 years.  Fecal occult blood test (FOBT) of the stool. You may have this test every year starting at age 1.  Flexible sigmoidoscopy or colonoscopy. You may have a sigmoidoscopy every 5 years or a colonoscopy every 10 years starting at age 41.  Prostate cancer screening. Recommendations will vary depending on your family history and other risks.  Hepatitis C blood test.  Hepatitis B blood test.  Sexually transmitted disease (STD) testing.  Diabetes screening. This is done by checking your blood sugar (glucose) after you have not eaten for a while (fasting). You may have this done every 1-3 years.  Abdominal aortic aneurysm (AAA) screening. You may need this if you are a current or former smoker.  Osteoporosis. You may be screened starting at age 73 if you are at high risk. Talk with your health care provider about your test results,  treatment options, and if necessary, the need for more tests. Vaccines  Your health care provider may recommend certain vaccines, such as:  Influenza vaccine. This is recommended every year.  Tetanus, diphtheria, and acellular pertussis (Tdap, Td) vaccine. You may need a Td booster every 10  years.  Zoster vaccine. You may need this after age 20.  Pneumococcal 13-valent conjugate (PCV13) vaccine. One dose is recommended after age 16.  Pneumococcal polysaccharide (PPSV23) vaccine. One dose is recommended after age 7. Talk to your health care provider about which screenings and vaccines you need and how often you need them. This information is not intended to replace advice given to you by your health care provider. Make sure you discuss any questions you have with your health care provider. Document Released: 01/20/2015 Document Revised: 09/13/2015 Document Reviewed: 10/25/2014 Elsevier Interactive Patient Education  2017 [place] Prevention in the Home Falls can cause injuries. They can happen to people of all ages. There are many things you can do to make your home safe and to help prevent falls. What can I do on the outside of my home?  Regularly fix the edges of walkways and driveways and fix any cracks.  Remove anything that might make you trip as you walk through a door, such as a raised step or threshold.  Trim any bushes or trees on the path to your home.  Use bright outdoor lighting.  Clear any walking paths of anything that might make someone trip, such as rocks or tools.  Regularly check to see if handrails are loose or broken. Make sure that both sides of any steps have handrails.  Any raised decks and porches should have guardrails on the edges.  Have any leaves, snow, or ice cleared regularly.  Use sand or salt on walking paths during winter.  Clean up any spills in your garage right away. This includes oil or grease spills. What can I do in the bathroom?  Use night lights.  Install grab bars by the toilet and in the tub and shower. Do not use towel bars as grab bars.  Use non-skid mats or decals in the tub or shower.  If you need to sit down in the shower, use a plastic, non-slip stool.  Keep the floor dry. Clean up any water that  spills on the floor as soon as it happens.  Remove soap buildup in the tub or shower regularly.  Attach bath mats securely with double-sided non-slip rug tape.  Do not have throw rugs and other things on the floor that can make you trip. What can I do in the bedroom?  Use night lights.  Make sure that you have a light by your bed that is easy to reach.  Do not use any sheets or blankets that are too big for your bed. They should not hang down onto the floor.  Have a firm chair that has side arms. You can use this for support while you get dressed.  Do not have throw rugs and other things on the floor that can make you trip. What can I do in the kitchen?  Clean up any spills right away.  Avoid walking on wet floors.  Keep items that you use a lot in easy-to-reach places.  If you need to reach something above you, use a strong step stool that has a grab bar.  Keep electrical cords out of the way.  Do not use floor polish or wax that makes floors  slippery. If you must use wax, use non-skid floor wax.  Do not have throw rugs and other things on the floor that can make you trip. What can I do with my stairs?  Do not leave any items on the stairs.  Make sure that there are handrails on both sides of the stairs and use them. Fix handrails that are broken or loose. Make sure that handrails are as long as the stairways.  Check any carpeting to make sure that it is firmly attached to the stairs. Fix any carpet that is loose or worn.  Avoid having throw rugs at the top or bottom of the stairs. If you do have throw rugs, attach them to the floor with carpet tape.  Make sure that you have a light switch at the top of the stairs and the bottom of the stairs. If you do not have them, ask someone to add them for you. What else can I do to help prevent falls?  Wear shoes that:  Do not have high heels.  Have rubber bottoms.  Are comfortable and fit you well.  Are closed at the  toe. Do not wear sandals.  If you use a stepladder:  Make sure that it is fully opened. Do not climb a closed stepladder.  Make sure that both sides of the stepladder are locked into place.  Ask someone to hold it for you, if possible.  Clearly mark and make sure that you can see:  Any grab bars or handrails.  First and last steps.  Where the edge of each step is.  Use tools that help you move around (mobility aids) if they are needed. These include:  Canes.  Walkers.  Scooters.  Crutches.  Turn on the lights when you go into a dark area. Replace any light bulbs as soon as they burn out.  Set up your furniture so you have a clear path. Avoid moving your furniture around.  If any of your floors are uneven, fix them.  If there are any pets around you, be aware of where they are.  Review your medicines with your doctor. Some medicines can make you feel dizzy. This can increase your chance of falling. Ask your doctor what other things that you can do to help prevent falls. This information is not intended to replace advice given to you by your health care provider. Make sure you discuss any questions you have with your health care provider. Document Released: 10/20/2008 Document Revised: 06/01/2015 Document Reviewed: 01/28/2014 Elsevier Interactive Patient Education  2017 [employer].

## 2017-06-16 NOTE — Progress Notes (Signed)
Subjective:   Randy Dalton is a 84 y.o. male who presents for Medicare Annual/Subsequent preventive examination.  Review of Systems:  N/A  Cardiac Risk Factors include: advanced age (>39men, >29 women);diabetes mellitus;dyslipidemia;obesity (BMI >30kg/m2);hypertension;male gender     Objective:    Vitals: BP (!) 126/58 (BP Location: Right Arm)   Pulse 69   Temp 98 F (36.7 C) (Oral)   Ht 5\' 8"  (1.727 m)   Wt 216 lb 3.2 oz (98.1 kg)   BMI 32.87 kg/m   Body mass index is 32.87 kg/m.  Advanced Directives 06/16/2017 03/12/2017 01/28/2017 01/28/2017 05/15/2016 03/23/2015 10/24/2014  Does Patient Have a Medical Advance Directive? No No Yes Yes No Yes No  Type of Advance Directive - Public librarian;Living will [place];Living will - Living will -  Does patient want to make changes to medical advance directive? - - No - Patient declined - - - Yes - information given  Would patient like information on creating a medical advance directive? No - Patient declined - - - No - Patient declined - Yes - Scientist, clinical (histocompatibility and immunogenetics) given    Tobacco Social History   Tobacco Use  Smoking Status Never Smoker  Smokeless Tobacco Never Used     Counseling given: Not Answered   Clinical Intake:  Pre-visit preparation completed: Yes  Pain : No/denies pain Pain Score: 0-No pain     Nutritional Status: BMI > 30  Obese Diabetes: Yes(type 2) CBG done?: No Did pt. bring in CBG monitor from home?: No  How often do you need to have someone help you when you read instructions, pamphlets, or other written materials from your doctor or pharmacy?: 3 - Sometimes(Getting ready to have cataract sx.)  Interpreter Needed?: No  Information entered by :: Mmarkoski. LPN  Past Medical History:  Diagnosis Date  . CHF (congestive heart failure) ([place])   . Chronic kidney disease   . Diabetes mellitus without complication ([place])   . Diverticulosis   . GERD (gastroesophageal  reflux disease)   . Hypercholesteremia   . Hypertension   . Hypothyroidism   . Obstructive sleep apnea    CPAP  . Wears dentures    partial top   Past Surgical History:  Procedure Laterality Date  . BACK SURGERY    . CATARACT EXTRACTION W/PHACO Left 10/24/2014   Procedure: CATARACT EXTRACTION PHACO AND INTRAOCULAR LENS PLACEMENT (IOC);  Surgeon: Ronnell Freshwater, MD;  Location: [facility];  Service: Ophthalmology;  Laterality: Left;  DIABETIC - oral meds  . COLONOSCOPY    . HERNIA REPAIR    . PILONIDAL CYST EXCISION    . STOMACH SURGERY     Family History  Problem Relation Age of Onset  . Hypertension Mother   . Kidney failure Mother   . Diabetes Father   . Lung cancer Brother   . Colon cancer Brother   . Cancer Brother        skin, removed  . Dementia Sister   . Dementia Sister   . Hypertension Sister    Social History   Socioeconomic History  . Marital status: Married    Spouse name: Not on file  . Number of children: 1  . Years of education: Not on file  . Highest education level: High school graduate  Occupational History  . Occupation: retired  Scientific laboratory technician  . Financial resource strain: Somewhat hard  . Food insecurity:    Worry: Never true    Inability:  Never true  . Transportation needs:    Medical: No    Non-medical: No  Tobacco Use  . Smoking status: Never Smoker  . Smokeless tobacco: Never Used  Substance and Sexual Activity  . Alcohol use: No    Alcohol/week: 0.0 oz  . Drug use: No  . Sexual activity: Not Currently  Lifestyle  . Physical activity:    Days per week: 0 days    Minutes per session: 0 min  . Stress: Not at all  Relationships  . Social connections:    Talks on phone: Once a week    Gets together: Once a week    Attends religious service: More than 4 times per year    Active member of club or organization: Yes    Attends meetings of clubs or organizations: More than 4 times per year    Relationship status:  Married  Other Topics Concern  . Not on file  Social History Narrative  . Not on file    Outpatient Encounter Medications as of 06/16/2017  Medication Sig  . amLODipine (NORVASC) 10 MG tablet Take 1 tablet (10 mg total) by mouth daily.  . carvedilol (COREG) 12.5 MG tablet TAKE 1 TABLET BY MOUTH TWICE DAILY  . Cholecalciferol (VITAMIN D) 2000 UNITS tablet Take 2,000 Units by mouth daily.   Marland Kitchen glimepiride (AMARYL) 4 MG tablet TAKE 1 TABLET BY MOUTH EVERY DAY  . hydrALAZINE (APRESOLINE) 100 MG tablet Take 1 tablet (100 mg total) by mouth 2 (two) times daily.  Marland Kitchen levothyroxine (SYNTHROID, LEVOTHROID) 175 MCG tablet TAKE 1 TABLET BY MOUTH EVERY DAY  . losartan (COZAAR) 50 MG tablet Take 1 tablet (50 mg total) by mouth daily.  . Multiple Vitamin (MULTIVITAMIN) tablet Take 1 tablet by mouth daily.  . potassium chloride SA (K-DUR,KLOR-CON) 20 MEQ tablet Take 1 tablet by mouth every other day.   . sildenafil (VIAGRA) 100 MG tablet Take 100 mg by mouth as needed.   . simvastatin (ZOCOR) 20 MG tablet TAKE ONE TABLET BY MOUTH AT BEDTIME.  Marland Kitchen torsemide (DEMADEX) 20 MG tablet Take 20 mg by mouth daily.  . TRADJENTA 5 MG TABS tablet TAKE ONE TABLET BY MOUTH EVERY DAY   No facility-administered encounter medications on file as of 06/16/2017.     Activities of Daily Living In your present state of health, do you have any difficulty performing the following activities: 06/16/2017 03/10/2017  Hearing? N N  Vision? N N  Difficulty concentrating or making decisions? N N  Walking or climbing stairs? N N  Dressing or bathing? N N  Doing errands, shopping? N Y  Conservation officer, nature and eating ? N -  Using the Toilet? N -  In the past six months, have you accidently leaked urine? N -  Do you have problems with loss of bowel control? N -  Managing your Medications? N -  Managing your Finances? N -  Housekeeping or managing your Housekeeping? N -  Some recent data might be hidden    Patient Care Team: Jerrol Banana., MD as PCP - General (Family Medicine) Lavonia Dana, MD as Consulting Physician (Internal Medicine) Alisa Graff, FNP as Nurse Practitioner (Family Medicine) Dionisio David, MD as Consulting Physician (Cardiology) Birder Robson, MD as Referring Physician (Ophthalmology)   Assessment:   This is a routine wellness examination for [place].  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 ), Exercise limited by: Other - see comments(flat footed)  Goals    . Weight (lb) < 200 lb (90.7 kg)     Recommend to continue current diet plan of cutting out all fatty foods to help aid in weight loss.        Fall Risk Fall Risk  06/16/2017 03/10/2017 02/06/2017 05/15/2016 03/23/2015  Falls in the past year? No No No No No   Is the patient's home free of loose throw rugs in walkways, pet beds, electrical cords, etc?   yes      Grab bars in the bathroom? no      Handrails on the stairs?   yes      Adequate lighting?   yes  Timed Get Up and Go Performed: N/A  Depression Screen PHQ 2/9 Scores 06/16/2017 03/10/2017 02/06/2017 05/15/2016  PHQ - 2 Score 0 0 0 0  PHQ- 9 Score - - - 0    Cognitive Function: Pt declined screening today.      6CIT Screen 05/15/2016  What Year? 0 points  What month? 0 points  What time? 0 points  Count back from 20 0 points  Months in reverse 4 points  Repeat phrase 2 points  Total Score 6    Immunization History  Administered Date(s) Administered  . Influenza, High Dose Seasonal PF 11/23/2014, 10/23/2015, 10/31/2016  . Pneumococcal Conjugate-13 12/13/2013  . Pneumococcal Polysaccharide-23 06/08/2012  . Td 04/28/2003    Qualifies for Shingles Vaccine? Due for Shingles vaccine. Declined my offer to administer today. Education has been provided regarding the importance of this vaccine. Pt has been  advised to call her insurance company to determine her out of pocket expense. Advised she may also receive this vaccine at her local pharmacy or Health Dept. Verbalized acceptance and understanding.  Screening Tests Health Maintenance  Topic Date Due  . FOOT EXAM  03/14/2017  . TETANUS/TDAP  01/07/2026 (Originally 04/27/2013)  . INFLUENZA VACCINE  08/07/2017  . HEMOGLOBIN A1C  09/11/2017  . OPHTHALMOLOGY EXAM  05/08/2018  . PNA vac Low Risk Adult  Completed   Cancer Screenings: Lung: Low Dose CT Chest recommended if Age 44-80 years, 30 pack-year currently smoking OR have quit w/in 15years. Patient does not qualify. Colorectal: N/A  Additional Screenings:  Hepatitis C Screening: N/A      Plan:  I have personally reviewed and addressed the Medicare Annual Wellness questionnaire and have noted the following in the patient's chart:  A. Medical and social history B. Use of alcohol, tobacco or illicit drugs  C. Current medications and supplements D. Functional ability and status E.  Nutritional status F.  Physical activity G. Advance directives H. List of other physicians I.  Hospitalizations, surgeries, and ER visits in previous 12 months J.  [place] such as hearing and vision if needed, cognitive and depression L. Referrals and appointments - none  In addition, I have reviewed and discussed with patient certain preventive protocols, quality metrics, and best practice recommendations. A written personalized care plan for preventive services as well as general preventive health recommendations were provided to patient.  See attached scanned questionnaire for additional information.   Signed,  Fabio Neighbors, LPN Nurse Health Advisor   Nurse Recommendations: Pt needs a diabetic foot exam today. Declined the tetanus vaccine today.

## 2017-06-17 ENCOUNTER — Encounter
Admission: RE | Disposition: A | Discharge status: Home / Self Care | Payer: Self-pay | Source: Ambulatory Visit | Attending: Ophthalmology

## 2017-06-17 ENCOUNTER — Ambulatory Visit: Payer: Medicare Other | Admitting: Anesthesiology

## 2017-06-17 ENCOUNTER — Ambulatory Visit: Payer: Self-pay | Admitting: Family Medicine

## 2017-06-17 ENCOUNTER — Ambulatory Visit
Admission: RE | Admit: 2017-06-17 | Discharge: 2017-06-17 | Disposition: A | Discharge status: Home / Self Care | Payer: Medicare Other | Source: Ambulatory Visit | Attending: Ophthalmology | Admitting: Ophthalmology

## 2017-06-17 DIAGNOSIS — Z79899 Other long term (current) drug therapy: Secondary | ICD-10-CM | POA: Diagnosis not present

## 2017-06-17 DIAGNOSIS — Z7984 Long term (current) use of oral hypoglycemic drugs: Secondary | ICD-10-CM | POA: Diagnosis not present

## 2017-06-17 DIAGNOSIS — Z886 Allergy status to analgesic agent status: Secondary | ICD-10-CM | POA: Insufficient documentation

## 2017-06-17 DIAGNOSIS — G4730 Sleep apnea, unspecified: Secondary | ICD-10-CM | POA: Diagnosis not present

## 2017-06-17 DIAGNOSIS — M1990 Unspecified osteoarthritis, unspecified site: Secondary | ICD-10-CM | POA: Insufficient documentation

## 2017-06-17 DIAGNOSIS — I509 Heart failure, unspecified: Secondary | ICD-10-CM | POA: Diagnosis not present

## 2017-06-17 DIAGNOSIS — E785 Hyperlipidemia, unspecified: Secondary | ICD-10-CM | POA: Diagnosis not present

## 2017-06-17 DIAGNOSIS — Z885 Allergy status to narcotic agent status: Secondary | ICD-10-CM | POA: Insufficient documentation

## 2017-06-17 DIAGNOSIS — I110 Hypertensive heart disease with heart failure: Secondary | ICD-10-CM | POA: Insufficient documentation

## 2017-06-17 DIAGNOSIS — G4733 Obstructive sleep apnea (adult) (pediatric): Secondary | ICD-10-CM | POA: Diagnosis not present

## 2017-06-17 DIAGNOSIS — Z9989 Dependence on other enabling machines and devices: Secondary | ICD-10-CM | POA: Insufficient documentation

## 2017-06-17 DIAGNOSIS — I130 Hypertensive heart and chronic kidney disease with heart failure and stage 1 through stage 4 chronic kidney disease, or unspecified chronic kidney disease: Secondary | ICD-10-CM | POA: Diagnosis not present

## 2017-06-17 DIAGNOSIS — E7800 Pure hypercholesterolemia, unspecified: Secondary | ICD-10-CM | POA: Diagnosis not present

## 2017-06-17 DIAGNOSIS — E039 Hypothyroidism, unspecified: Secondary | ICD-10-CM | POA: Insufficient documentation

## 2017-06-17 DIAGNOSIS — N189 Chronic kidney disease, unspecified: Secondary | ICD-10-CM | POA: Diagnosis not present

## 2017-06-17 DIAGNOSIS — H2511 Age-related nuclear cataract, right eye: Secondary | ICD-10-CM | POA: Diagnosis not present

## 2017-06-17 DIAGNOSIS — K219 Gastro-esophageal reflux disease without esophagitis: Secondary | ICD-10-CM | POA: Diagnosis not present

## 2017-06-17 DIAGNOSIS — E1151 Type 2 diabetes mellitus with diabetic peripheral angiopathy without gangrene: Secondary | ICD-10-CM | POA: Insufficient documentation

## 2017-06-17 DIAGNOSIS — E1122 Type 2 diabetes mellitus with diabetic chronic kidney disease: Secondary | ICD-10-CM | POA: Diagnosis not present

## 2017-06-17 HISTORY — DX: Gastro-esophageal reflux disease without esophagitis: K21.9

## 2017-06-17 HISTORY — DX: Chronic kidney disease, unspecified: N18.9

## 2017-06-17 HISTORY — PX: CATARACT EXTRACTION W/PHACO: SHX586

## 2017-06-17 LAB — COMPREHENSIVE METABOLIC PANEL
ALT: 17 IU/L (ref 0–44)
AST: 16 IU/L (ref 0–40)
Albumin/Globulin Ratio: 2 (ref 1.2–2.2)
Albumin: 4.5 g/dL (ref 3.5–4.7)
Alkaline Phosphatase: 71 IU/L (ref 39–117)
BUN/Creatinine Ratio: 24 (ref 10–24)
BUN: 58 mg/dL — AB (ref 8–27)
Bilirubin Total: 0.4 mg/dL (ref 0.0–1.2)
CALCIUM: 10 mg/dL (ref 8.6–10.2)
CO2: 18 mmol/L — AB (ref 20–29)
CREATININE: 2.4 mg/dL — AB (ref 0.76–1.27)
Chloride: 102 mmol/L (ref 96–106)
GFR calc Af Amer: 27 mL/min/{1.73_m2} — ABNORMAL LOW (ref >59)
GFR, EST NON AFRICAN AMERICAN: 23 mL/min/{1.73_m2} — AB (ref >59)
GLOBULIN, TOTAL: 2.3 g/dL (ref 1.5–4.5)
GLUCOSE: 191 mg/dL — AB (ref 65–99)
Potassium: 4.4 mmol/L (ref 3.5–5.2)
SODIUM: 138 mmol/L (ref 134–144)
Total Protein: 6.8 g/dL (ref 6.0–8.5)

## 2017-06-17 LAB — HEMOGLOBIN A1C
ESTIMATED AVERAGE GLUCOSE: 174 mg/dL
Hgb A1c MFr Bld: 7.7 % — ABNORMAL HIGH (ref 4.8–5.6)

## 2017-06-17 LAB — GLUCOSE, CAPILLARY: Glucose-Capillary: 161 mg/dL — ABNORMAL HIGH (ref 65–99)

## 2017-06-17 SURGERY — PHACOEMULSIFICATION, CATARACT, WITH IOL INSERTION
Anesthesia: Monitor Anesthesia Care | Site: Eye | Laterality: Right | Wound class: "Clean "

## 2017-06-17 MED ORDER — NA CHONDROIT SULF-NA HYALURON 40-17 MG/ML IO SOLN
INTRAOCULAR | Status: AC
  Filled 2017-06-17: qty 1

## 2017-06-17 MED ORDER — FENTANYL CITRATE (PF) 100 MCG/2ML IJ SOLN
INTRAMUSCULAR | Status: AC
  Filled 2017-06-17: qty 2

## 2017-06-17 MED ORDER — ARMC OPHTHALMIC DILATING DROPS
1.0000 "application " | OPHTHALMIC | Status: AC
  Administered 2017-06-17 (×3): 1 via OPHTHALMIC

## 2017-06-17 MED ORDER — EPINEPHRINE PF 1 MG/ML IJ SOLN
INTRAOCULAR | Status: DC | PRN
  Administered 2017-06-17: 1 mL via OPHTHALMIC

## 2017-06-17 MED ORDER — POVIDONE-IODINE 5 % OP SOLN
OPHTHALMIC | Status: DC | PRN
  Administered 2017-06-17: 1 via OPHTHALMIC

## 2017-06-17 MED ORDER — SODIUM CHLORIDE 0.9 % IV SOLN
INTRAVENOUS | Status: DC
  Administered 2017-06-17: 06:00:00 via INTRAVENOUS

## 2017-06-17 MED ORDER — ARMC OPHTHALMIC DILATING DROPS
OPHTHALMIC | Status: AC
  Administered 2017-06-17: 1 via OPHTHALMIC
  Filled 2017-06-17: qty 0.4

## 2017-06-17 MED ORDER — POVIDONE-IODINE 5 % OP SOLN
OPHTHALMIC | Status: AC
  Filled 2017-06-17: qty 30

## 2017-06-17 MED ORDER — MOXIFLOXACIN HCL 0.5 % OP SOLN: 1.0000 [drp] | OPHTHALMIC | Status: DC | PRN

## 2017-06-17 MED ORDER — LIDOCAINE HCL (PF) 4 % IJ SOLN
INTRAMUSCULAR | Status: AC
Start: 2017-06-17 — End: ?
  Filled 2017-06-17: qty 5

## 2017-06-17 MED ORDER — NA CHONDROIT SULF-NA HYALURON 40-17 MG/ML IO SOLN
INTRAOCULAR | Status: DC | PRN
  Administered 2017-06-17: 1 mL via INTRAOCULAR

## 2017-06-17 MED ORDER — CARBACHOL 0.01 % IO SOLN
INTRAOCULAR | Status: DC | PRN
  Administered 2017-06-17: .5 mL via INTRAOCULAR

## 2017-06-17 MED ORDER — MOXIFLOXACIN HCL 0.5 % OP SOLN
OPHTHALMIC | Status: DC | PRN
  Administered 2017-06-17: .2 mL via OPHTHALMIC

## 2017-06-17 MED ORDER — LIDOCAINE HCL (PF) 4 % IJ SOLN
INTRAOCULAR | Status: DC | PRN
  Administered 2017-06-17: 2 mL via OPHTHALMIC

## 2017-06-17 MED ORDER — FENTANYL CITRATE (PF) 100 MCG/2ML IJ SOLN
INTRAMUSCULAR | Status: DC | PRN
  Administered 2017-06-17: 50 ug via INTRAVENOUS

## 2017-06-17 MED ORDER — EPINEPHRINE PF 1 MG/ML IJ SOLN
INTRAMUSCULAR | Status: AC
  Filled 2017-06-17: qty 1

## 2017-06-17 MED ORDER — MOXIFLOXACIN HCL 0.5 % OP SOLN
OPHTHALMIC | Status: AC
  Filled 2017-06-17: qty 3

## 2017-06-17 SURGICAL SUPPLY — 18 items
DEVICE MILOOP (MISCELLANEOUS) IMPLANT
GLOVE BIO SURGEON STRL SZ8 (GLOVE) ×2 IMPLANT
GLOVE BIOGEL M 6.5 STRL (GLOVE) ×2 IMPLANT
GLOVE SURG LX 8.0 MICRO (GLOVE) ×1
GLOVE SURG LX STRL 8.0 MICRO (GLOVE) ×1 IMPLANT
GOWN STRL REUS W/ TWL LRG LVL3 (GOWN DISPOSABLE) ×2 IMPLANT
GOWN STRL REUS W/TWL LRG LVL3 (GOWN DISPOSABLE) ×2
LABEL CATARACT MEDS ST (LABEL) ×2 IMPLANT
LENS IOL ACRYSOF IQ 19.5 (Intraocular Lens) ×1 IMPLANT
MILOOP DEVICE (MISCELLANEOUS) ×2
PACK CATARACT (MISCELLANEOUS) ×2 IMPLANT
PACK CATARACT BRASINGTON LX (MISCELLANEOUS) ×2 IMPLANT
PACK EYE AFTER SURG (MISCELLANEOUS) ×2 IMPLANT
SOL BSS BAG (MISCELLANEOUS) ×2
SOLUTION BSS BAG (MISCELLANEOUS) ×1 IMPLANT
SYR 5ML LL (SYRINGE) ×2 IMPLANT
WATER STERILE IRR 250ML POUR (IV SOLUTION) ×2 IMPLANT
WIPE NON LINTING 3.25X3.25 (MISCELLANEOUS) ×2 IMPLANT

## 2017-06-17 NOTE — Transfer of Care (Signed)
Immediate Anesthesia Transfer of Care Note  Patient: Randy Dalton  Procedure(s) Performed: CATARACT EXTRACTION PHACO AND INTRAOCULAR LENS PLACEMENT (I[facility]) (Right Eye)  Patient Location: PACU and Short Stay  Anesthesia Type:MAC  Level of Consciousness: awake, alert , oriented and patient cooperative  Airway & Oxygen Therapy: Patient Spontanous Breathing  Post-op Assessment: Report given to RN and Post -op Vital signs reviewed and stable  Post vital signs: Reviewed and stable  Last Vitals:  Vitals Value Taken Time  BP 130/58 06/17/2017  7:58 AM  Temp 36.4 C 06/17/2017  7:58 AM  Pulse 54 06/17/2017  7:58 AM  Resp 16 06/17/2017  7:58 AM  SpO2 99 % 06/17/2017  7:58 AM    Last Pain:  Vitals:   06/17/17 0758  TempSrc: Temporal  PainSc: 0-No pain         Complications: No apparent anesthesia complications

## 2017-06-17 NOTE — Anesthesia Post-op Follow-up Note (Signed)
Anesthesia QCDR form completed.        

## 2017-06-17 NOTE — H&P (Signed)
All labs reviewed. Abnormal studies sent to patients PCP when indicated.  Previous H&P reviewed, patient examined, there are NO CHANGES.  Jozef Eisenbeis Porfilio6/11/20197:11 AM

## 2017-06-17 NOTE — Op Note (Signed)
PREOPERATIVE DIAGNOSIS:  Nuclear sclerotic cataract of the right eye.   POSTOPERATIVE DIAGNOSIS:  nuclear sclerotic cataract right eye   OPERATIVE PROCEDURE: Procedure(s): CATARACT EXTRACTION PHACO AND INTRAOCULAR LENS PLACEMENT (IOC)   SURGEON:  Birder Robson, MD.   ANESTHESIA:  Anesthesiologist: Alvin Critchley, MD CRNA: Eben Burow, CRNA  1.      Managed anesthesia care. 2.      0.30ml of Shugarcaine was instilled in the eye following the paracentesis.   COMPLICATIONS:  None.   TECHNIQUE:   Stop and chop   DESCRIPTION OF PROCEDURE:  The patient was examined and consented in the preoperative holding area where the aforementioned topical anesthesia was applied to the right eye and then brought back to the Operating Room where the right eye was prepped and draped in the usual sterile ophthalmic fashion and a lid speculum was placed. A paracentesis was created with the side port blade and the anterior chamber was filled with viscoelastic. A near clear corneal incision was performed with the steel keratome. A continuous curvilinear capsulorrhexis was performed with a cystotome followed by the capsulorrhexis forceps. Hydrodissection and hydrodelineation were carried out with BSS on a blunt cannula. The lens was removed in a stop and chop  technique modified using the MiLoop device, and the remaining cortical material was removed with the irrigation-aspiration handpiece. The capsular bag was inflated with viscoelastic and the IQ lens was placed in the capsular bag without complication. The remaining viscoelastic was removed from the eye with the irrigation-aspiration handpiece. The wounds were hydrated. The anterior chamber was flushed with Miostat and the eye was inflated to physiologic pressure. 0.47ml of Vigamox was placed in the anterior chamber. The wounds were found to be water tight. The eye was dressed with Vigamox. The patient was given protective glasses to wear throughout the day and a  shield with which to sleep tonight. The patient was also given drops with which to begin a drop regimen today and will follow-up with me in one day. Implant Name Type Inv. Item Serial No. Manufacturer Lot No. LRB No. Used  LENS IOL ACRYSOF IQ 19.5 - [id number] Intraocular Lens LENS IOL ACRYSOF IQ 19.5 [id number] ALCON  Right 1   Procedure(s) with comments: CATARACT EXTRACTION PHACO AND INTRAOCULAR LENS PLACEMENT (IOC) (Right) - [place] 01:43 AP% 19.7 CDE 20.48 Fluid pack lot # [id number]  Electronically signed: Birder Robson 06/17/2017 7:58 AM

## 2017-06-17 NOTE — Discharge Instructions (Signed)
Eye Surgery Discharge Instructions  Expect mild scratchy sensation or mild soreness. DO NOT RUB YOUR EYE!  The day of surgery:  Minimal physical activity, but bed rest is not required  No reading, computer work, or close hand work  No bending, lifting, or straining.  May watch TV  For 24 hours:  No driving, legal decisions, or alcoholic beverages  Safety precautions  Eat anything you prefer: It is better to start with liquids, then soup then solid foods.  _____ Eye patch should be worn until postoperative exam tomorrow.  ____ Solar shield eyeglasses should be worn for comfort in the sunlight/patch while sleeping  Resume all regular medications including aspirin or Coumadin if these were discontinued prior to surgery. You may shower, bathe, shave, or wash your hair. Tylenol may be taken for mild discomfort.  Call your doctor if you experience significant pain, nausea, or vomiting, fever > 101 or other signs of infection. [phone] or [phone] Specific instructions:  Follow-up Information    Birder Robson, MD Follow up.   Specialty:  Ophthalmology Why:  06/17/17 at 1:35 Contact information: [address] [place] [address] [phone]          Eye Surgery Discharge Instructions  Expect mild scratchy sensation or mild soreness. DO NOT RUB YOUR EYE!  The day of surgery:  Minimal physical activity, but bed rest is not required  No reading, computer work, or close hand work  No bending, lifting, or straining.  May watch TV  For 24 hours:  No driving, legal decisions, or alcoholic beverages  Safety precautions  Eat anything you prefer: It is better to start with liquids, then soup then solid foods.  _____ Eye patch should be worn until postoperative exam tomorrow.  ____ Solar shield eyeglasses should be worn for comfort in the sunlight/patch while sleeping  Resume all regular medications including aspirin or Coumadin if these were  discontinued prior to surgery. You may shower, bathe, shave, or wash your hair. Tylenol may be taken for mild discomfort.  Call your doctor if you experience significant pain, nausea, or vomiting, fever > 101 or other signs of infection. [phone] or [phone] Specific instructions:  Follow-up Information    Birder Robson, MD Follow up.   Specialty:  Ophthalmology Why:  06/17/17 at 1:35 Contact information: [address] [place]  [address] [phone]

## 2017-06-17 NOTE — Anesthesia Postprocedure Evaluation (Signed)
Anesthesia Post Note  Patient: Randy Dalton  Procedure(s) Performed: CATARACT EXTRACTION PHACO AND INTRAOCULAR LENS PLACEMENT ([place]) (Right Eye)  Patient location during evaluation: Short Stay Anesthesia Type: MAC Level of consciousness: awake and alert, oriented and patient cooperative Pain management: pain level controlled Vital Signs Assessment: post-procedure vital signs reviewed and stable Respiratory status: spontaneous breathing, nonlabored ventilation and respiratory function stable Cardiovascular status: blood pressure returned to baseline Postop Assessment: adequate PO intake, no apparent nausea or vomiting and no headache Anesthetic complications: no     Last Vitals:  Vitals:   06/17/17 0758 06/17/17 0808  BP: (!) 130/58 (!) 133/54  Pulse: (!) 54 (!) 59  Resp: 16   Temp: 36.4 C   SpO2: 99% 98%    Last Pain:  Vitals:   06/17/17 0808  TempSrc:   PainSc: 0-No pain                 Eben Burow

## 2017-06-17 NOTE — Anesthesia Preprocedure Evaluation (Addendum)
Anesthesia Evaluation  Patient identified by MRN, date of birth, ID band Patient awake    Reviewed: Allergy & Precautions, NPO status , Patient's Chart, lab work & pertinent test results  Airway Mallampati: III  TM Distance: <3 FB     Dental  (+) Chipped   Pulmonary sleep apnea ,    Pulmonary exam normal        Cardiovascular hypertension, + Peripheral Vascular Disease and +CHF  Normal cardiovascular exam     Neuro/Psych negative neurological ROS  negative psych ROS   GI/Hepatic GERD  ,  Endo/Other  diabetesHypothyroidism   Renal/GU Renal InsufficiencyRenal disease     Musculoskeletal  (+) Arthritis ,   Abdominal Normal abdominal exam  (+)   Peds  Hematology   Anesthesia Other Findings   Reproductive/Obstetrics                            Anesthesia Physical Anesthesia Plan  ASA: III  Anesthesia Plan: MAC   Post-op Pain Management:    Induction: Intravenous  PONV Risk Score and Plan:   Airway Management Planned: Nasal Cannula  Additional Equipment:   Intra-op Plan:   Post-operative Plan:   Informed Consent: I have reviewed the patients History and Physical, chart, labs and discussed the procedure including the risks, benefits and alternatives for the proposed anesthesia with the patient or authorized representative who has indicated his/her understanding and acceptance.   Dental advisory given  Plan Discussed with: CRNA and Surgeon  Anesthesia Plan Comments:         Anesthesia Quick Evaluation

## 2017-06-19 ENCOUNTER — Other Ambulatory Visit: Payer: Self-pay | Admitting: Family Medicine

## 2017-06-24 DIAGNOSIS — I2510 Atherosclerotic heart disease of native coronary artery without angina pectoris: Secondary | ICD-10-CM | POA: Diagnosis not present

## 2017-06-24 DIAGNOSIS — R0602 Shortness of breath: Secondary | ICD-10-CM | POA: Diagnosis not present

## 2017-06-24 DIAGNOSIS — I509 Heart failure, unspecified: Secondary | ICD-10-CM | POA: Diagnosis not present

## 2017-06-24 DIAGNOSIS — I10 Essential (primary) hypertension: Secondary | ICD-10-CM | POA: Diagnosis not present

## 2017-06-24 NOTE — Progress Notes (Signed)
Advised  ED 

## 2017-07-04 ENCOUNTER — Other Ambulatory Visit: Payer: Self-pay | Admitting: Family Medicine

## 2017-07-12 ENCOUNTER — Other Ambulatory Visit: Payer: Self-pay | Admitting: Family Medicine

## 2017-07-12 DIAGNOSIS — I10 Essential (primary) hypertension: Secondary | ICD-10-CM

## 2017-07-16 DIAGNOSIS — E876 Hypokalemia: Secondary | ICD-10-CM | POA: Diagnosis not present

## 2017-09-10 DIAGNOSIS — N183 Chronic kidney disease, stage 3 (moderate): Secondary | ICD-10-CM | POA: Diagnosis not present

## 2017-09-10 DIAGNOSIS — I129 Hypertensive chronic kidney disease with stage 1 through stage 4 chronic kidney disease, or unspecified chronic kidney disease: Secondary | ICD-10-CM | POA: Diagnosis not present

## 2017-09-10 DIAGNOSIS — R801 Persistent proteinuria, unspecified: Secondary | ICD-10-CM | POA: Diagnosis not present

## 2017-09-10 DIAGNOSIS — E1122 Type 2 diabetes mellitus with diabetic chronic kidney disease: Secondary | ICD-10-CM | POA: Diagnosis not present

## 2017-09-15 ENCOUNTER — Ambulatory Visit: Payer: Medicare Other | Attending: Family | Admitting: Family

## 2017-09-15 ENCOUNTER — Encounter: Payer: Self-pay | Admitting: Family

## 2017-09-15 VITALS — BP 129/62 | HR 52 | Resp 18 | Ht 68.0 in | Wt 211.5 lb

## 2017-09-15 DIAGNOSIS — Z7984 Long term (current) use of oral hypoglycemic drugs: Secondary | ICD-10-CM | POA: Diagnosis not present

## 2017-09-15 DIAGNOSIS — I130 Hypertensive heart and chronic kidney disease with heart failure and stage 1 through stage 4 chronic kidney disease, or unspecified chronic kidney disease: Secondary | ICD-10-CM | POA: Diagnosis not present

## 2017-09-15 DIAGNOSIS — Z9889 Other specified postprocedural states: Secondary | ICD-10-CM | POA: Diagnosis not present

## 2017-09-15 DIAGNOSIS — Z886 Allergy status to analgesic agent status: Secondary | ICD-10-CM | POA: Insufficient documentation

## 2017-09-15 DIAGNOSIS — E7800 Pure hypercholesterolemia, unspecified: Secondary | ICD-10-CM | POA: Diagnosis not present

## 2017-09-15 DIAGNOSIS — E039 Hypothyroidism, unspecified: Secondary | ICD-10-CM | POA: Diagnosis not present

## 2017-09-15 DIAGNOSIS — I5032 Chronic diastolic (congestive) heart failure: Secondary | ICD-10-CM

## 2017-09-15 DIAGNOSIS — Z885 Allergy status to narcotic agent status: Secondary | ICD-10-CM | POA: Diagnosis not present

## 2017-09-15 DIAGNOSIS — N183 Chronic kidney disease, stage 3 unspecified: Secondary | ICD-10-CM

## 2017-09-15 DIAGNOSIS — I10 Essential (primary) hypertension: Secondary | ICD-10-CM

## 2017-09-15 DIAGNOSIS — Z79890 Hormone replacement therapy (postmenopausal): Secondary | ICD-10-CM | POA: Insufficient documentation

## 2017-09-15 DIAGNOSIS — G4733 Obstructive sleep apnea (adult) (pediatric): Secondary | ICD-10-CM | POA: Diagnosis not present

## 2017-09-15 DIAGNOSIS — E1122 Type 2 diabetes mellitus with diabetic chronic kidney disease: Secondary | ICD-10-CM | POA: Diagnosis not present

## 2017-09-15 DIAGNOSIS — N189 Chronic kidney disease, unspecified: Secondary | ICD-10-CM | POA: Insufficient documentation

## 2017-09-15 DIAGNOSIS — Z79899 Other long term (current) drug therapy: Secondary | ICD-10-CM | POA: Diagnosis not present

## 2017-09-15 NOTE — Progress Notes (Signed)
Patient ID: Randy Dalton, male    DOB: [birth year], 84 y.o.   MRN: [id number]  HPI  Randy Dalton is an 84 y/o male with a history of DM, hyperlipidemia, HTN, thyroid disease, obstructive sleep apnea (CPAP) diverticulosis and chronic heart failure.   Echo report from 01/28/17 reviewed and showed an EF of 70% along with trivial AR, mild Randy, mild AS and mildly elevated PA pressure of 40 mm Hg.   Has not been admitted or been in the ED in the last 6 months.   He presents today for a follow-up visit with a chief complaint of minimal shortness of breath upon moderate exertion. He describes this as chronic in nature having been present for several years. He has associated fatigue along with this. He denies any difficulty sleeping, abdominal distention, palpitations, pedal edema, chest pain, cough, dizziness or weight gain. Has noticed a gradual weight loss due to the change in how he's eating (low sodium foods)  Past Medical History:  Diagnosis Date  . CHF (congestive heart failure) ([place])   . Chronic kidney disease   . Diabetes mellitus without complication ([place])   . Diverticulosis   . GERD (gastroesophageal reflux disease)   . Hypercholesteremia   . Hypertension   . Hypothyroidism   . Obstructive sleep apnea    CPAP  . Wears dentures    partial top   Past Surgical History:  Procedure Laterality Date  . BACK SURGERY    . CATARACT EXTRACTION W/PHACO Left 10/24/2014   Procedure: CATARACT EXTRACTION PHACO AND INTRAOCULAR LENS PLACEMENT (IOC);  Surgeon: Ronnell Freshwater, MD;  Location: [place];  Service: Ophthalmology;  Laterality: Left;  DIABETIC - oral meds  . CATARACT EXTRACTION W/PHACO Right 06/17/2017   Procedure: CATARACT EXTRACTION PHACO AND INTRAOCULAR LENS PLACEMENT (IOC);  Surgeon: Birder Robson, MD;  Location: [facility];  Service: Ophthalmology;  Laterality: Right;  [place] 01:43 AP% 19.7 CDE 20.48 Fluid pack lot # [id number]  . COLONOSCOPY    . HERNIA REPAIR    .  PILONIDAL CYST EXCISION    . STOMACH SURGERY     Family History  Problem Relation Age of Onset  . Hypertension Mother   . Kidney failure Mother   . Diabetes Father   . Lung cancer Brother   . Colon cancer Brother   . Cancer Brother        skin, removed  . Dementia Sister   . Dementia Sister   . Hypertension Sister    Social History   Tobacco Use  . Smoking status: Never Smoker  . Smokeless tobacco: Never Used  Substance Use Topics  . Alcohol use: No    Alcohol/week: 0.0 standard drinks   Allergies  Allergen Reactions  . Aspirin     Feel bad  . Codeine Nausea Only   Prior to Admission medications   Medication Sig Start Date End Date Taking? Authorizing Provider  amLODipine (NORVASC) 10 MG tablet TAKE 1 TABLET(10 MG) BY MOUTH DAILY 07/13/17  Yes Jerrol Banana., MD  carvedilol (COREG) 12.5 MG tablet TAKE 1 TABLET BY MOUTH TWICE DAILY 07/04/17  Yes Jerrol Banana., MD  Cholecalciferol (VITAMIN D) 2000 UNITS tablet Take 2,000 Units by mouth daily.  11/08/10  Yes [provider]  glimepiride (AMARYL) 4 MG tablet TAKE 1 TABLET BY MOUTH EVERY DAY 12/18/16  Yes Jerrol Banana., MD  hydrALAZINE (APRESOLINE) 100 MG tablet Take 1 tablet (100 mg total) by mouth 2 (two)  times daily. 12/02/16  Yes Jerrol Banana., MD  levothyroxine (SYNTHROID, LEVOTHROID) 175 MCG tablet TAKE 1 TABLET BY MOUTH EVERY DAY 07/13/17  Yes Jerrol Banana., MD  losartan (COZAAR) 50 MG tablet Take 1 tablet (50 mg total) by mouth daily. 01/31/17  Yes Vaughan Basta, MD  Multiple Vitamin (MULTIVITAMIN) tablet Take 1 tablet by mouth daily.   Yes [provider]  potassium chloride SA (K-DUR,KLOR-CON) 20 MEQ tablet Take 1 tablet by mouth every other day.  02/03/17  Yes [provider]  sildenafil (VIAGRA) 100 MG tablet Take 100 mg by mouth as needed.  10/09/13  Yes [provider]  simvastatin (ZOCOR) 20 MG tablet TAKE ONE TABLET BY MOUTH AT  BEDTIME. 07/04/17  Yes Jerrol Banana., MD  torsemide (DEMADEX) 20 MG tablet Take 20 mg by mouth daily. 02/21/17  Yes [provider]  TRADJENTA 5 MG TABS tablet TAKE ONE TABLET BY MOUTH EVERY DAY 06/19/17  Yes Jerrol Banana., MD    Review of Systems  Constitutional: Positive for fatigue. Negative for appetite change.  HENT: Negative for congestion, postnasal drip and sore throat.   Eyes: Negative.   Respiratory: Positive for shortness of breath (with moderate exertion). Negative for cough and chest tightness.   Cardiovascular: Negative for chest pain, palpitations and leg swelling.  Gastrointestinal: Negative for abdominal distention and abdominal pain.  Endocrine: Negative.   Genitourinary: Negative.   Musculoskeletal: Negative for back pain and neck pain.  Skin: Negative.   Allergic/Immunologic: Negative.   Neurological: Negative for dizziness and light-headedness.  Hematological: Negative for adenopathy. Does not bruise/bleed easily.  Psychiatric/Behavioral: Negative for dysphoric mood and sleep disturbance (wearing CPAP). The patient is not nervous/anxious.    Vitals:   09/15/17 1101  BP: 129/62  Pulse: (!) 52  Resp: 18  SpO2: 99%  Weight: 211 lb 8 oz (95.9 kg)  Height: 5\' 8"  (1.727 m)   Wt Readings from Last 3 Encounters:  09/15/17 211 lb 8 oz (95.9 kg)  06/10/17 212 lb (96.2 kg)  06/16/17 216 lb (98 kg)   Lab Results  Component Value Date   CREATININE 2.40 (H) 06/16/2017   CREATININE 2.59 (H) 03/13/2017   CREATININE 2.93 (H) 03/12/2017   Physical Exam  Constitutional: He is oriented to person, place, and time. He appears well-developed and well-nourished.  HENT:  Head: Normocephalic and atraumatic.  Neck: Normal range of motion. Neck supple. No JVD present.  Cardiovascular: Regular rhythm. Bradycardia present.  Pulmonary/Chest: Effort normal. He has no wheezes. He has no rales.  Abdominal: Soft. He exhibits no distension. There is no  tenderness.  Musculoskeletal: He exhibits no edema or tenderness.  Neurological: He is alert and oriented to person, place, and time.  Skin: Skin is warm and dry.  Psychiatric: He has a normal mood and affect. His behavior is normal. Thought content normal.  Nursing note and vitals reviewed.  Assessment & Plan:  1: Chronic heart failure with preserved ejection fraction- - NYHA class II - euvolemic today - weighing daily and he says that his weight has gradually declined. Instructed to call for an overnight weight gain of >2 pounds or a weekly weight gain of >5 pounds - weight down 7 pounds since he was last here 6 months ago - not adding salt to his food and has been trying to read food labels. Has been trying to closely follow a 2000mg  sodium diet and admits that he doesn't think food tastes good  anymore since he's not using salt.  - saw cardiology Humphrey Rolls) 2-3 months ago - BNP 01/28/17 was 861.0  2: Diabetes- - checked it last week and it "was good" - A1c 06/16/17 was 7.7%  3: HTN- - BP looks good today - saw PCP [name]) 06/16/17 - BMP from 06/16/17 reviewed and showed sodium 138, potassium 4.4, creatinine 2.40 and GFR 23  Patient did not bring his medications nor a list. Each medication was verbally reviewed with the patient and he was encouraged to bring the bottles to every visit to confirm accuracy of list.  Will not make a return appointment for patient at this time. Advised him to continue to follow closely with his cardiologist as well as his PCP. Advised him that he could return at any time for any questions/problems.

## 2017-09-15 NOTE — Patient Instructions (Signed)
Continue weighing daily and call for an overnight weight gain of > 2 pounds or a weekly weight gain of >5 pounds. 

## 2017-09-21 ENCOUNTER — Other Ambulatory Visit: Payer: Self-pay | Admitting: Family Medicine

## 2017-10-10 ENCOUNTER — Other Ambulatory Visit: Payer: Self-pay | Admitting: Family Medicine

## 2017-10-10 DIAGNOSIS — I10 Essential (primary) hypertension: Secondary | ICD-10-CM

## 2017-10-15 ENCOUNTER — Encounter: Payer: Self-pay | Admitting: Family Medicine

## 2017-10-15 ENCOUNTER — Ambulatory Visit (INDEPENDENT_AMBULATORY_CARE_PROVIDER_SITE_OTHER): Payer: Medicare Other | Admitting: Family Medicine

## 2017-10-15 VITALS — BP 138/72 | HR 68 | Temp 98.7°F | Resp 16 | Ht 68.0 in | Wt 208.0 lb

## 2017-10-15 DIAGNOSIS — E1122 Type 2 diabetes mellitus with diabetic chronic kidney disease: Secondary | ICD-10-CM | POA: Diagnosis not present

## 2017-10-15 DIAGNOSIS — N183 Chronic kidney disease, stage 3 (moderate): Secondary | ICD-10-CM

## 2017-10-15 DIAGNOSIS — E039 Hypothyroidism, unspecified: Secondary | ICD-10-CM | POA: Diagnosis not present

## 2017-10-15 DIAGNOSIS — Z23 Encounter for immunization: Secondary | ICD-10-CM | POA: Diagnosis not present

## 2017-10-15 DIAGNOSIS — E782 Mixed hyperlipidemia: Secondary | ICD-10-CM

## 2017-10-15 DIAGNOSIS — I10 Essential (primary) hypertension: Secondary | ICD-10-CM | POA: Diagnosis not present

## 2017-10-15 LAB — POCT GLYCOSYLATED HEMOGLOBIN (HGB A1C): HEMOGLOBIN A1C: 8.3 % — AB (ref 4.0–5.6)

## 2017-10-15 NOTE — Progress Notes (Signed)
Patient: Randy Dalton Male    DOB: 1927-07-27   84 y.o.   MRN: [id number] Visit Date: 10/15/2017  Today's Provider: [name], MD   Chief Complaint  Patient presents with  . Diabetes  . Hypertension  . Hyperlipidemia  . Hypothyroidism   Subjective:    HPI  Overall pt feels well and has no complaints.  Diabetes Mellitus Type II, Follow-up:   Lab Results  Component Value Date   HGBA1C 8.3 (A) 10/15/2017   HGBA1C 7.7 (H) 06/16/2017   HGBA1C 7.0 (H) 03/11/2017     Last seen for diabetes 4 months ago.  Management since then includes no changes. He reports good compliance with treatment. He is not having side effects.  Current symptoms include none and have been stable. Home blood sugar records: trend: stable  Episodes of hypoglycemia? no   Current Insulin Regimen: none Most Recent Eye Exam:  Weight trend: stable  Prior visit with dietician: no Current diet: well balanced Current exercise: no regular exercise Tolerating Zocor. Pertinent Labs:    Component Value Date/Time   CHOL 146 03/11/2017 0835   TRIG 202 (H) 03/11/2017 0835   HDL 32 (L) 03/11/2017 0835   LDLCALC 74 03/11/2017 0835   CREATININE 2.40 (H) 06/16/2017 1101    Wt Readings from Last 3 Encounters:  10/15/17 208 lb (94.3 kg)  09/15/17 211 lb 8 oz (95.9 kg)  06/10/17 212 lb (96.2 kg)    Hypertension, follow-up:  BP Readings from Last 3 Encounters:  10/15/17 138/72  09/15/17 129/62  06/17/17 (!) 133/54    He was last seen for hypertension 4 months ago.  BP at that visit was 126/58. Management since that visit includes no changes. He reports good compliance with treatment. He is not having side effects.  He is not exercising. He is adherent to low salt diet.   Outside blood pressures are checked occasionally. He is experiencing none.  Patient denies exertional chest pressure/discomfort, lower extremity edema and palpitations.   Cardiovascular risk factors include  diabetes mellitus and dyslipidemia.   Hypothyroidism, follow up: Patient was seen in the office 4 months ago. No changes were made in his medications. He is currently taking levothyroxine 135mcg daily, and reports good compliance and good symptom control.  Lab Results  Component Value Date   TSH 0.603 03/11/2017       Allergies  Allergen Reactions  . Aspirin     Feel bad  . Codeine Nausea Only     Current Outpatient Medications:  .  amLODipine (NORVASC) 10 MG tablet, TAKE 1 TABLET(10 MG) BY MOUTH DAILY, Disp: 90 tablet, Rfl: 0 .  carvedilol (COREG) 12.5 MG tablet, TAKE 1 TABLET BY MOUTH TWICE DAILY, Disp: 180 tablet, Rfl: 3 .  Cholecalciferol (VITAMIN D) 2000 UNITS tablet, Take 2,000 Units by mouth daily. , Disp: , Rfl:  .  glimepiride (AMARYL) 4 MG tablet, TAKE 1 TABLET BY MOUTH EVERY DAY, Disp: 90 tablet, Rfl: 3 .  hydrALAZINE (APRESOLINE) 100 MG tablet, Take 1 tablet (100 mg total) by mouth 2 (two) times daily., Disp: 180 tablet, Rfl: 3 .  levothyroxine (SYNTHROID, LEVOTHROID) 175 MCG tablet, TAKE 1 TABLET BY MOUTH EVERY DAY, Disp: 90 tablet, Rfl: 3 .  losartan (COZAAR) 50 MG tablet, Take 1 tablet (50 mg total) by mouth daily., Disp: 30 tablet, Rfl: 0 .  Multiple Vitamin (MULTIVITAMIN) tablet, Take 1 tablet by mouth daily., Disp: , Rfl:  .  potassium chloride SA (  K-DUR,KLOR-CON) 20 MEQ tablet, Take 1 tablet by mouth every other day. , Disp: , Rfl: 1 .  sildenafil (VIAGRA) 100 MG tablet, Take 100 mg by mouth as needed. , Disp: , Rfl:  .  simvastatin (ZOCOR) 20 MG tablet, TAKE ONE TABLET BY MOUTH AT BEDTIME., Disp: 90 tablet, Rfl: 3 .  torsemide (DEMADEX) 20 MG tablet, Take 20 mg by mouth daily., Disp: , Rfl: 1 .  TRADJENTA 5 MG TABS tablet, TAKE ONE TABLET BY MOUTH EVERY DAY, Disp: 30 tablet, Rfl: 11  Review of Systems  Constitutional: Negative for activity change, appetite change, diaphoresis, fatigue and fever.  Respiratory: Negative for cough and shortness of breath.     Cardiovascular: Negative for chest pain, palpitations and leg swelling.  Endocrine: Negative for cold intolerance, heat intolerance, polydipsia, polyphagia and polyuria.  Musculoskeletal: Negative for arthralgias and back pain.  Allergic/Immunologic: Negative.   Neurological: Negative for light-headedness and headaches.  Hematological: Negative.   Psychiatric/Behavioral: Negative.     Social History   Tobacco Use  . Smoking status: Never Smoker  . Smokeless tobacco: Never Used  Substance Use Topics  . Alcohol use: No    Alcohol/week: 0.0 standard drinks   Objective:   BP 138/72 (BP Location: Right Arm, Patient Position: Sitting, Cuff Size: Normal)   Pulse 68   Temp 98.7 F (37.1 C)   Resp 16   Ht 5\' 8"  (1.727 m)   Wt 208 lb (94.3 kg)   SpO2 98%   BMI 31.63 kg/m  Vitals:   10/15/17 0944  BP: 138/72  Pulse: 68  Resp: 16  Temp: 98.7 F (37.1 C)  SpO2: 98%  Weight: 208 lb (94.3 kg)  Height: 5\' 8"  (1.727 m)     Physical Exam  Constitutional: He is oriented to person, place, and time. He appears well-developed and well-nourished.  HENT:  Head: Normocephalic and atraumatic.  Right Ear: External ear normal.  Left Ear: External ear normal.  Nose: Nose normal.  Eyes: Conjunctivae are normal. No scleral icterus.  Neck: No thyromegaly present.  Cardiovascular: Normal rate, regular rhythm, normal heart sounds and intact distal pulses.  Pulmonary/Chest: Effort normal and breath sounds normal.  Abdominal: Soft.  Musculoskeletal:  Trace edema.  Neurological: He is alert and oriented to person, place, and time.  Skin: Skin is warm and dry.  Psychiatric: He has a normal mood and affect. His behavior is normal. Judgment and thought content normal.        Assessment & Plan:     1. Essential (primary) hypertension   2. Adult hypothyroidism   3. Type 2 diabetes mellitus with stage 3 chronic kidney disease, without long-term current use of insulin (HCC)  - POCT  glycosylated hemoglobin (Hb A1C)  4. Mixed hyperlipidemia   5. Flu vaccine need - Flu vaccine HIGH DOSE PF (Fluzone High dose) 6.Obesity      [name], MD  [facility]

## 2017-11-11 DIAGNOSIS — E782 Mixed hyperlipidemia: Secondary | ICD-10-CM | POA: Diagnosis not present

## 2017-11-11 DIAGNOSIS — G4730 Sleep apnea, unspecified: Secondary | ICD-10-CM | POA: Diagnosis not present

## 2017-11-11 DIAGNOSIS — I10 Essential (primary) hypertension: Secondary | ICD-10-CM | POA: Diagnosis not present

## 2017-11-11 DIAGNOSIS — I2510 Atherosclerotic heart disease of native coronary artery without angina pectoris: Secondary | ICD-10-CM | POA: Diagnosis not present

## 2017-11-11 DIAGNOSIS — R0602 Shortness of breath: Secondary | ICD-10-CM | POA: Diagnosis not present

## 2017-11-11 DIAGNOSIS — I509 Heart failure, unspecified: Secondary | ICD-10-CM | POA: Diagnosis not present

## 2017-11-13 ENCOUNTER — Other Ambulatory Visit: Payer: Self-pay | Admitting: Family Medicine

## 2017-11-13 DIAGNOSIS — I10 Essential (primary) hypertension: Secondary | ICD-10-CM

## 2017-12-09 ENCOUNTER — Other Ambulatory Visit: Payer: Self-pay | Admitting: Family Medicine

## 2017-12-17 DIAGNOSIS — I10 Essential (primary) hypertension: Secondary | ICD-10-CM | POA: Diagnosis not present

## 2017-12-17 DIAGNOSIS — R801 Persistent proteinuria, unspecified: Secondary | ICD-10-CM | POA: Diagnosis not present

## 2017-12-17 DIAGNOSIS — E1122 Type 2 diabetes mellitus with diabetic chronic kidney disease: Secondary | ICD-10-CM | POA: Diagnosis not present

## 2017-12-17 DIAGNOSIS — N184 Chronic kidney disease, stage 4 (severe): Secondary | ICD-10-CM | POA: Diagnosis not present

## 2018-01-08 DIAGNOSIS — H43813 Vitreous degeneration, bilateral: Secondary | ICD-10-CM | POA: Diagnosis not present

## 2018-01-16 ENCOUNTER — Other Ambulatory Visit: Payer: Self-pay | Admitting: Family Medicine

## 2018-01-27 ENCOUNTER — Ambulatory Visit (INDEPENDENT_AMBULATORY_CARE_PROVIDER_SITE_OTHER): Payer: Medicare Other | Admitting: Family Medicine

## 2018-01-27 ENCOUNTER — Ambulatory Visit
Admission: RE | Admit: 2018-01-27 | Discharge: 2018-01-27 | Disposition: A | Discharge status: Home / Self Care | Payer: Medicare Other | Source: Ambulatory Visit | Attending: Family Medicine | Admitting: Family Medicine

## 2018-01-27 ENCOUNTER — Ambulatory Visit
Admission: RE | Admit: 2018-01-27 | Discharge: 2018-01-27 | Disposition: A | Discharge status: Home / Self Care | Payer: Medicare Other | Attending: Family Medicine | Admitting: Family Medicine

## 2018-01-27 ENCOUNTER — Encounter: Payer: Self-pay | Admitting: Family Medicine

## 2018-01-27 VITALS — BP 154/79 | HR 66 | Temp 97.7°F | Wt 212.4 lb

## 2018-01-27 DIAGNOSIS — M25579 Pain in unspecified ankle and joints of unspecified foot: Secondary | ICD-10-CM | POA: Insufficient documentation

## 2018-01-27 DIAGNOSIS — Z6832 Body mass index (BMI) 32.0-32.9, adult: Secondary | ICD-10-CM | POA: Diagnosis not present

## 2018-01-27 DIAGNOSIS — M7989 Other specified soft tissue disorders: Secondary | ICD-10-CM | POA: Diagnosis not present

## 2018-01-27 DIAGNOSIS — M25571 Pain in right ankle and joints of right foot: Secondary | ICD-10-CM | POA: Insufficient documentation

## 2018-01-27 DIAGNOSIS — E1122 Type 2 diabetes mellitus with diabetic chronic kidney disease: Secondary | ICD-10-CM | POA: Diagnosis not present

## 2018-01-27 DIAGNOSIS — G4733 Obstructive sleep apnea (adult) (pediatric): Secondary | ICD-10-CM | POA: Diagnosis not present

## 2018-01-27 DIAGNOSIS — E6609 Other obesity due to excess calories: Secondary | ICD-10-CM | POA: Diagnosis not present

## 2018-01-27 DIAGNOSIS — N183 Chronic kidney disease, stage 3 (moderate): Secondary | ICD-10-CM | POA: Diagnosis not present

## 2018-01-27 MED ORDER — MELOXICAM 7.5 MG PO TABS: 7.5000 mg | ORAL_TABLET | Freq: Two times a day (BID) | ORAL | 0 refills | Status: DC

## 2018-01-27 NOTE — Progress Notes (Signed)
Patient: Randy Dalton Male    DOB: 1927-12-10   85 y.o.   MRN: [id number] Visit Date: 01/27/2018  Today's Provider: [name], MD   Chief Complaint  Patient presents with  . Ankle Pain   Subjective:     Ankle Pain   The incident occurred more than 1 week ago. The pain is present in the right ankle, right foot and right knee. The quality of the pain is described as aching and shooting. The pain is at a severity of 5/10. The pain is mild. The pain has been intermittent since onset. Associated symptoms include an inability to bear weight. He reports no foreign bodies present. The symptoms are aggravated by weight bearing. He has tried elevation and acetaminophen for the symptoms. The treatment provided mild relief.  No known trauma.  Allergies  Allergen Reactions  . Aspirin     Feel bad  . Codeine Nausea Only     Current Outpatient Medications:  .  amLODipine (NORVASC) 10 MG tablet, TAKE 1 TABLET(10 MG) BY MOUTH DAILY, Disp: 90 tablet, Rfl: 3 .  carvedilol (COREG) 12.5 MG tablet, TAKE 1 TABLET BY MOUTH TWICE DAILY, Disp: 180 tablet, Rfl: 3 .  Cholecalciferol (VITAMIN D) 2000 UNITS tablet, Take 2,000 Units by mouth daily. , Disp: , Rfl:  .  glimepiride (AMARYL) 4 MG tablet, TAKE 1 TABLET BY MOUTH EVERY DAY, Disp: 90 tablet, Rfl: 3 .  hydrALAZINE (APRESOLINE) 50 MG tablet, TAKE 1 TABLET BY MOUTH TWICE DAILY, Disp: 60 tablet, Rfl: 2 .  levothyroxine (SYNTHROID, LEVOTHROID) 175 MCG tablet, TAKE 1 TABLET BY MOUTH EVERY DAY, Disp: 90 tablet, Rfl: 3 .  losartan (COZAAR) 50 MG tablet, Take 1 tablet (50 mg total) by mouth daily., Disp: 30 tablet, Rfl: 0 .  Multiple Vitamin (MULTIVITAMIN) tablet, Take 1 tablet by mouth daily., Disp: , Rfl:  .  potassium chloride SA (K-DUR,KLOR-CON) 20 MEQ tablet, Take 1 tablet by mouth every other day. , Disp: , Rfl: 1 .  sildenafil (VIAGRA) 100 MG tablet, Take 100 mg by mouth as needed. , Disp: , Rfl:  .  simvastatin (ZOCOR) 20 MG tablet,  TAKE ONE TABLET BY MOUTH AT BEDTIME., Disp: 90 tablet, Rfl: 3 .  torsemide (DEMADEX) 20 MG tablet, Take 20 mg by mouth daily., Disp: , Rfl: 1 .  TRADJENTA 5 MG TABS tablet, TAKE ONE TABLET BY MOUTH EVERY DAY, Disp: 30 tablet, Rfl: 11  Review of Systems  Constitutional: Negative.   HENT: Negative.   Eyes: Negative.   Respiratory: Negative.   Cardiovascular: Negative.   Endocrine: Negative.   Genitourinary: Negative.   Musculoskeletal: Negative.   Allergic/Immunologic: Negative.   Neurological: Negative.   Psychiatric/Behavioral: Negative.     Social History   Tobacco Use  . Smoking status: Never Smoker  . Smokeless tobacco: Never Used  Substance Use Topics  . Alcohol use: No    Alcohol/week: 0.0 standard drinks      Objective:   BP (!) 154/79 (BP Location: Left Arm, Patient Position: Sitting, Cuff Size: Normal)   Pulse 66   Temp 97.7 F (36.5 C) (Oral)   Wt 212 lb 6.4 oz (96.3 kg)   SpO2 98%   BMI 32.30 kg/m  Vitals:   01/27/18 1410  BP: (!) 154/79  Pulse: 66  Temp: 97.7 F (36.5 C)  TempSrc: Oral  SpO2: 98%  Weight: 212 lb 6.4 oz (96.3 kg)     Physical Exam Constitutional:  Appearance: He is well-developed.  HENT:     Head: Normocephalic and atraumatic.     Right Ear: External ear normal.     Left Ear: External ear normal.     Nose: Nose normal.  Eyes:     General: No scleral icterus.    Conjunctiva/sclera: Conjunctivae normal.  Neck:     Thyroid: No thyromegaly.  Cardiovascular:     Rate and Rhythm: Normal rate and regular rhythm.     Heart sounds: Normal heart sounds.  Pulmonary:     Effort: Pulmonary effort is normal.     Breath sounds: Normal breath sounds.  Abdominal:     Palpations: Abdomen is soft.  Musculoskeletal:     Comments: Trace edema.  Skin:    General: Skin is warm and dry.  Neurological:     Mental Status: He is alert and oriented to person, place, and time.  Psychiatric:        Behavior: Behavior normal.         Thought Content: Thought content normal.        Judgment: Judgment normal.         Assessment & Plan    1. Right ankle pain, unspecified chronicity  - meloxicam (MOBIC) 7.5 MG tablet; Take 1 tablet (7.5 mg total) by mouth 2 (two) times daily.  Dispense: 28 tablet; Refill: 0 - DG Ankle Complete Right; Future - Ambulatory referral to Podiatry  2. Obstructive sleep apnea syndrome   3. Type 2 diabetes mellitus with stage 3 chronic kidney disease, without long-term current use of insulin (HCC)   4. Class 1 obesity due to excess calories with serious comorbidity and body mass index (BMI) of 32.0 to 32.9 in adult With OSA,DM,HTN.  I, Porsha McClurkin, CMA, am acting as a scribe for [name], MD.     I have done the exam and reviewed the above chart and it is accurate to the best of my knowledge. Development worker, community has been used in this note in any air is in the dictation or transcription are unintentional. I have done the exam and reviewed the chart and it is accurate to the best of my knowledge. Development worker, community has been used and  any errors in dictation or transcription are unintentional. Miguel Aschoff M.D. [place], MD  [facility]

## 2018-02-09 ENCOUNTER — Other Ambulatory Visit: Payer: Self-pay | Admitting: Podiatry

## 2018-02-09 DIAGNOSIS — M659 Synovitis and tenosynovitis, unspecified: Secondary | ICD-10-CM | POA: Diagnosis not present

## 2018-02-09 DIAGNOSIS — M25571 Pain in right ankle and joints of right foot: Secondary | ICD-10-CM | POA: Diagnosis not present

## 2018-02-09 DIAGNOSIS — M79661 Pain in right lower leg: Secondary | ICD-10-CM | POA: Diagnosis not present

## 2018-02-18 ENCOUNTER — Encounter: Payer: Self-pay | Admitting: Family Medicine

## 2018-02-18 ENCOUNTER — Ambulatory Visit (INDEPENDENT_AMBULATORY_CARE_PROVIDER_SITE_OTHER): Payer: Medicare Other | Admitting: Family Medicine

## 2018-02-18 VITALS — BP 132/76 | HR 68 | Temp 98.2°F | Resp 16 | Wt 202.0 lb

## 2018-02-18 DIAGNOSIS — I10 Essential (primary) hypertension: Secondary | ICD-10-CM | POA: Diagnosis not present

## 2018-02-18 DIAGNOSIS — N183 Chronic kidney disease, stage 3 (moderate): Secondary | ICD-10-CM | POA: Diagnosis not present

## 2018-02-18 DIAGNOSIS — E039 Hypothyroidism, unspecified: Secondary | ICD-10-CM

## 2018-02-18 DIAGNOSIS — E782 Mixed hyperlipidemia: Secondary | ICD-10-CM | POA: Diagnosis not present

## 2018-02-18 DIAGNOSIS — E1122 Type 2 diabetes mellitus with diabetic chronic kidney disease: Secondary | ICD-10-CM | POA: Diagnosis not present

## 2018-02-18 LAB — POCT GLYCOSYLATED HEMOGLOBIN (HGB A1C): Hemoglobin A1C: 8.6 % — AB (ref 4.0–5.6)

## 2018-02-18 NOTE — Progress Notes (Signed)
Patient: Randy Dalton Male    DOB: [birth year]   85 y.o.   MRN: [id number] Visit Date: 02/18/2018  Today's Provider: [name], MD   Chief Complaint  Patient presents with  . Diabetes   Subjective:     HPI   Diabetes Mellitus Type II, Follow-up:   Lab Results  Component Value Date   HGBA1C 8.6 (A) 02/18/2018   HGBA1C 8.3 (A) 10/15/2017   HGBA1C 7.7 (H) 06/16/2017    Last seen for diabetes 4 months ago.  Management since then includes no changes. He reports good compliance with treatment. He is not having side effects.  Current symptoms include none and have been stable. Home blood sugar records: trend: stable  Episodes of hypoglycemia? no  Most Recent Eye Exam: 10/2017 Weight trend: stable Prior visit with dietician: no Current diet: well balanced Current exercise: no regular exercise  Pertinent Labs:    Component Value Date/Time   CHOL 146 03/11/2017 0835   TRIG 202 (H) 03/11/2017 0835   HDL 32 (L) 03/11/2017 0835   LDLCALC 74 03/11/2017 0835   CREATININE 2.40 (H) 06/16/2017 1101    Wt Readings from Last 3 Encounters:  02/18/18 202 lb (91.6 kg)  01/27/18 212 lb 6.4 oz (96.3 kg)  10/15/17 208 lb (94.3 kg)    Allergies  Allergen Reactions  . Aspirin     Feel bad  . Codeine Nausea Only     Current Outpatient Medications:  .  amLODipine (NORVASC) 10 MG tablet, TAKE 1 TABLET(10 MG) BY MOUTH DAILY, Disp: 90 tablet, Rfl: 3 .  carvedilol (COREG) 12.5 MG tablet, TAKE 1 TABLET BY MOUTH TWICE DAILY, Disp: 180 tablet, Rfl: 3 .  Cholecalciferol (VITAMIN D) 2000 UNITS tablet, Take 2,000 Units by mouth daily. , Disp: , Rfl:  .  glimepiride (AMARYL) 4 MG tablet, TAKE 1 TABLET BY MOUTH EVERY DAY, Disp: 90 tablet, Rfl: 3 .  hydrALAZINE (APRESOLINE) 50 MG tablet, TAKE 1 TABLET BY MOUTH TWICE DAILY, Disp: 60 tablet, Rfl: 2 .  levothyroxine (SYNTHROID, LEVOTHROID) 175 MCG tablet, TAKE 1 TABLET BY MOUTH EVERY DAY, Disp: 90 tablet, Rfl: 3 .  losartan  (COZAAR) 50 MG tablet, Take 1 tablet (50 mg total) by mouth daily., Disp: 30 tablet, Rfl: 0 .  meloxicam (MOBIC) 7.5 MG tablet, Take 1 tablet (7.5 mg total) by mouth 2 (two) times daily., Disp: 28 tablet, Rfl: 0 .  Multiple Vitamin (MULTIVITAMIN) tablet, Take 1 tablet by mouth daily., Disp: , Rfl:  .  potassium chloride SA (K-DUR,KLOR-CON) 20 MEQ tablet, Take 1 tablet by mouth every other day. , Disp: , Rfl: 1 .  sildenafil (VIAGRA) 100 MG tablet, Take 100 mg by mouth as needed. , Disp: , Rfl:  .  simvastatin (ZOCOR) 20 MG tablet, TAKE ONE TABLET BY MOUTH AT BEDTIME., Disp: 90 tablet, Rfl: 3 .  torsemide (DEMADEX) 20 MG tablet, Take 20 mg by mouth daily., Disp: , Rfl: 1 .  TRADJENTA 5 MG TABS tablet, TAKE ONE TABLET BY MOUTH EVERY DAY, Disp: 30 tablet, Rfl: 11  Review of Systems  Constitutional: Negative.   HENT: Negative.   Eyes: Negative.   Respiratory: Negative for cough and shortness of breath.   Cardiovascular: Negative for chest pain, palpitations and leg swelling.  Gastrointestinal: Negative.   Endocrine: Negative.   Musculoskeletal: Negative for arthralgias, myalgias, neck pain and neck stiffness.  Allergic/Immunologic: Negative.   Psychiatric/Behavioral: Negative for agitation, self-injury, sleep disturbance and suicidal ideas.  Social History   Tobacco Use  . Smoking status: Never Smoker  . Smokeless tobacco: Never Used  Substance Use Topics  . Alcohol use: No    Alcohol/week: 0.0 standard drinks      Objective:   BP 132/76 (BP Location: Left Arm, Patient Position: Sitting, Cuff Size: Large)   Pulse 68   Temp 98.2 F (36.8 C)   Resp 16   Wt 202 lb (91.6 kg)   BMI 30.71 kg/m  Vitals:   02/18/18 0938  BP: 132/76  Pulse: 68  Resp: 16  Temp: 98.2 F (36.8 C)  Weight: 202 lb (91.6 kg)     Physical Exam Constitutional:      Appearance: He is well-developed.  HENT:     Head: Normocephalic and atraumatic.     Right Ear: External ear normal.     Left Ear:  External ear normal.     Nose: Nose normal.  Eyes:     General: No scleral icterus.    Conjunctiva/sclera: Conjunctivae normal.  Neck:     Thyroid: No thyromegaly.  Cardiovascular:     Rate and Rhythm: Normal rate and regular rhythm.     Heart sounds: Normal heart sounds.  Pulmonary:     Effort: Pulmonary effort is normal.     Breath sounds: Normal breath sounds.  Abdominal:     Palpations: Abdomen is soft.  Musculoskeletal:     Comments: Trace edema.  Skin:    General: Skin is warm and dry.  Neurological:     General: No focal deficit present.     Mental Status: He is alert and oriented to person, place, and time. Mental status is at baseline.  Psychiatric:        Behavior: Behavior normal.        Thought Content: Thought content normal.        Judgment: Judgment normal.         Assessment & Plan    1. Type 2 diabetes mellitus with stage 3 chronic kidney disease, without long-term current use of insulin ([place]) Work on lifestyle.  Goal A1c of less than 79 in this 85 year old.Return  To clinic 4 months. - POCT glycosylated hemoglobin (Hb A1C)--8.6 today - CBC with Differential/Platelet - Comprehensive metabolic panel  2. Essential (primary) hypertension Controlled. - CBC with Differential/Platelet - Comprehensive metabolic panel  3. Mixed hyperlipidemia  - Lipid Panel With LDL/HDL Ratio  4. Adult hypothyroidism  - TSH    I have done the exam and reviewed the above chart and it is accurate to the best of my knowledge. Development worker, community has been used in this note in any air is in the dictation or transcription are unintentional.  [name], MD  [place]

## 2018-02-19 ENCOUNTER — Ambulatory Visit
Admission: RE | Admit: 2018-02-19 | Discharge: 2018-02-19 | Disposition: A | Discharge status: Home / Self Care | Payer: Medicare Other | Source: Ambulatory Visit | Attending: Podiatry | Admitting: Podiatry

## 2018-02-19 ENCOUNTER — Telehealth: Payer: Self-pay

## 2018-02-19 DIAGNOSIS — M659 Synovitis and tenosynovitis, unspecified: Secondary | ICD-10-CM | POA: Insufficient documentation

## 2018-02-19 DIAGNOSIS — M19071 Primary osteoarthritis, right ankle and foot: Secondary | ICD-10-CM | POA: Diagnosis not present

## 2018-02-19 LAB — LIPID PANEL WITH LDL/HDL RATIO
Cholesterol, Total: 143 mg/dL (ref 100–199)
HDL: 36 mg/dL — AB (ref >39)
LDL Calculated: 80 mg/dL (ref 0–99)
LDl/HDL Ratio: 2.2 ratio (ref 0.0–3.6)
Triglycerides: 135 mg/dL (ref 0–149)
VLDL Cholesterol Cal: 27 mg/dL (ref 5–40)

## 2018-02-19 LAB — CBC WITH DIFFERENTIAL/PLATELET
BASOS: 1 %
Basophils Absolute: 0.1 10*3/uL (ref 0.0–0.2)
EOS (ABSOLUTE): 0.5 10*3/uL — ABNORMAL HIGH (ref 0.0–0.4)
Eos: 4 %
Hematocrit: 39.1 % (ref 37.5–51.0)
Hemoglobin: 13.3 g/dL (ref 13.0–17.7)
Immature Grans (Abs): 0 10*3/uL (ref 0.0–0.1)
Immature Granulocytes: 0 %
LYMPHS ABS: 1.9 10*3/uL (ref 0.7–3.1)
Lymphs: 19 %
MCH: 31.6 pg (ref 26.6–33.0)
MCHC: 34 g/dL (ref 31.5–35.7)
MCV: 93 fL (ref 79–97)
Monocytes Absolute: 0.8 10*3/uL (ref 0.1–0.9)
Monocytes: 8 %
NEUTROS ABS: 6.8 10*3/uL (ref 1.4–7.0)
Neutrophils: 68 %
Platelets: 161 10*3/uL (ref 150–450)
RBC: 4.21 x10E6/uL (ref 4.14–5.80)
RDW: 12.5 % (ref 11.6–15.4)
WBC: 10.1 10*3/uL (ref 3.4–10.8)

## 2018-02-19 LAB — COMPREHENSIVE METABOLIC PANEL
ALT: 12 IU/L (ref 0–44)
AST: 12 IU/L (ref 0–40)
Albumin/Globulin Ratio: 1.7 (ref 1.2–2.2)
Albumin: 4.5 g/dL (ref 3.6–4.6)
Alkaline Phosphatase: 87 IU/L (ref 39–117)
BUN/Creatinine Ratio: 22 (ref 10–24)
BUN: 43 mg/dL — ABNORMAL HIGH (ref 8–27)
Bilirubin Total: 0.7 mg/dL (ref 0.0–1.2)
CO2: 21 mmol/L (ref 20–29)
Calcium: 10 mg/dL (ref 8.6–10.2)
Chloride: 100 mmol/L (ref 96–106)
Creatinine, Ser: 1.94 mg/dL — ABNORMAL HIGH (ref 0.76–1.27)
GFR calc Af Amer: 35 mL/min/{1.73_m2} — ABNORMAL LOW (ref >59)
GFR calc non Af Amer: 30 mL/min/{1.73_m2} — ABNORMAL LOW (ref >59)
GLOBULIN, TOTAL: 2.6 g/dL (ref 1.5–4.5)
Glucose: 206 mg/dL — ABNORMAL HIGH (ref 65–99)
POTASSIUM: 4.3 mmol/L (ref 3.5–5.2)
Sodium: 139 mmol/L (ref 134–144)
Total Protein: 7.1 g/dL (ref 6.0–8.5)

## 2018-02-19 LAB — TSH: TSH: 0.455 u[IU]/mL (ref 0.450–4.500)

## 2018-02-19 NOTE — Telephone Encounter (Signed)
Advised  ED 

## 2018-02-19 NOTE — Telephone Encounter (Signed)
Left message to call back  

## 2018-02-19 NOTE — Telephone Encounter (Signed)
-----   Message from Jerrol Banana., MD sent at 02/19/2018  9:42 AM EST ----- Stable.

## 2018-02-20 DIAGNOSIS — M25571 Pain in right ankle and joints of right foot: Secondary | ICD-10-CM | POA: Diagnosis not present

## 2018-02-20 DIAGNOSIS — E119 Type 2 diabetes mellitus without complications: Secondary | ICD-10-CM | POA: Diagnosis not present

## 2018-02-20 DIAGNOSIS — M659 Synovitis and tenosynovitis, unspecified: Secondary | ICD-10-CM | POA: Diagnosis not present

## 2018-02-20 DIAGNOSIS — M19071 Primary osteoarthritis, right ankle and foot: Secondary | ICD-10-CM | POA: Diagnosis not present

## 2018-02-20 DIAGNOSIS — M8430XA Stress fracture, unspecified site, initial encounter for fracture: Secondary | ICD-10-CM | POA: Diagnosis not present

## 2018-03-09 DIAGNOSIS — E785 Hyperlipidemia, unspecified: Secondary | ICD-10-CM | POA: Diagnosis not present

## 2018-03-09 DIAGNOSIS — R809 Proteinuria, unspecified: Secondary | ICD-10-CM | POA: Diagnosis not present

## 2018-03-09 DIAGNOSIS — I129 Hypertensive chronic kidney disease with stage 1 through stage 4 chronic kidney disease, or unspecified chronic kidney disease: Secondary | ICD-10-CM | POA: Diagnosis not present

## 2018-03-09 DIAGNOSIS — E1122 Type 2 diabetes mellitus with diabetic chronic kidney disease: Secondary | ICD-10-CM | POA: Diagnosis not present

## 2018-03-09 DIAGNOSIS — N183 Chronic kidney disease, stage 3 (moderate): Secondary | ICD-10-CM | POA: Diagnosis not present

## 2018-03-11 DIAGNOSIS — N184 Chronic kidney disease, stage 4 (severe): Secondary | ICD-10-CM | POA: Diagnosis not present

## 2018-03-11 DIAGNOSIS — E1122 Type 2 diabetes mellitus with diabetic chronic kidney disease: Secondary | ICD-10-CM | POA: Diagnosis not present

## 2018-03-11 DIAGNOSIS — N2581 Secondary hyperparathyroidism of renal origin: Secondary | ICD-10-CM | POA: Diagnosis not present

## 2018-03-11 DIAGNOSIS — I129 Hypertensive chronic kidney disease with stage 1 through stage 4 chronic kidney disease, or unspecified chronic kidney disease: Secondary | ICD-10-CM | POA: Diagnosis not present

## 2018-03-12 DIAGNOSIS — E782 Mixed hyperlipidemia: Secondary | ICD-10-CM | POA: Diagnosis not present

## 2018-03-12 DIAGNOSIS — R0602 Shortness of breath: Secondary | ICD-10-CM | POA: Diagnosis not present

## 2018-03-12 DIAGNOSIS — G4730 Sleep apnea, unspecified: Secondary | ICD-10-CM | POA: Diagnosis not present

## 2018-03-12 DIAGNOSIS — I509 Heart failure, unspecified: Secondary | ICD-10-CM | POA: Diagnosis not present

## 2018-03-12 DIAGNOSIS — I351 Nonrheumatic aortic (valve) insufficiency: Secondary | ICD-10-CM | POA: Diagnosis not present

## 2018-03-12 DIAGNOSIS — I10 Essential (primary) hypertension: Secondary | ICD-10-CM | POA: Diagnosis not present

## 2018-03-12 DIAGNOSIS — I2510 Atherosclerotic heart disease of native coronary artery without angina pectoris: Secondary | ICD-10-CM | POA: Diagnosis not present

## 2018-03-18 DIAGNOSIS — M21071 Valgus deformity, not elsewhere classified, right ankle: Secondary | ICD-10-CM | POA: Diagnosis not present

## 2018-03-18 DIAGNOSIS — M8430XA Stress fracture, unspecified site, initial encounter for fracture: Secondary | ICD-10-CM | POA: Diagnosis not present

## 2018-03-18 DIAGNOSIS — E119 Type 2 diabetes mellitus without complications: Secondary | ICD-10-CM | POA: Diagnosis not present

## 2018-03-18 DIAGNOSIS — M19071 Primary osteoarthritis, right ankle and foot: Secondary | ICD-10-CM | POA: Diagnosis not present

## 2018-03-18 DIAGNOSIS — M76821 Posterior tibial tendinitis, right leg: Secondary | ICD-10-CM | POA: Diagnosis not present

## 2018-04-21 ENCOUNTER — Other Ambulatory Visit: Payer: Self-pay | Admitting: Family Medicine

## 2018-04-21 NOTE — Telephone Encounter (Signed)
Pharmacy requesting refills. Thanks!  

## 2018-06-04 DIAGNOSIS — I129 Hypertensive chronic kidney disease with stage 1 through stage 4 chronic kidney disease, or unspecified chronic kidney disease: Secondary | ICD-10-CM | POA: Diagnosis not present

## 2018-06-04 DIAGNOSIS — R809 Proteinuria, unspecified: Secondary | ICD-10-CM | POA: Diagnosis not present

## 2018-06-04 DIAGNOSIS — E785 Hyperlipidemia, unspecified: Secondary | ICD-10-CM | POA: Diagnosis not present

## 2018-06-04 DIAGNOSIS — E1122 Type 2 diabetes mellitus with diabetic chronic kidney disease: Secondary | ICD-10-CM | POA: Diagnosis not present

## 2018-06-04 DIAGNOSIS — N183 Chronic kidney disease, stage 3 (moderate): Secondary | ICD-10-CM | POA: Diagnosis not present

## 2018-06-10 ENCOUNTER — Other Ambulatory Visit: Payer: Self-pay | Admitting: Family Medicine

## 2018-06-10 DIAGNOSIS — E1122 Type 2 diabetes mellitus with diabetic chronic kidney disease: Secondary | ICD-10-CM | POA: Diagnosis not present

## 2018-06-10 DIAGNOSIS — I129 Hypertensive chronic kidney disease with stage 1 through stage 4 chronic kidney disease, or unspecified chronic kidney disease: Secondary | ICD-10-CM | POA: Diagnosis not present

## 2018-06-10 DIAGNOSIS — R809 Proteinuria, unspecified: Secondary | ICD-10-CM | POA: Diagnosis not present

## 2018-06-10 DIAGNOSIS — N184 Chronic kidney disease, stage 4 (severe): Secondary | ICD-10-CM | POA: Diagnosis not present

## 2018-06-10 NOTE — Telephone Encounter (Signed)
[facility] faxed refill request for the following medications:  TRADJENTA 5 MG TABS tablet    Please advise.

## 2018-06-11 MED ORDER — LINAGLIPTIN 5 MG PO TABS: 5.0000 mg | ORAL_TABLET | Freq: Every day | ORAL | 11 refills | Status: DC

## 2018-06-11 NOTE — Telephone Encounter (Signed)
Please review. Thanks!  

## 2018-06-16 DIAGNOSIS — I2510 Atherosclerotic heart disease of native coronary artery without angina pectoris: Secondary | ICD-10-CM | POA: Diagnosis not present

## 2018-06-16 DIAGNOSIS — I10 Essential (primary) hypertension: Secondary | ICD-10-CM | POA: Diagnosis not present

## 2018-06-16 DIAGNOSIS — E782 Mixed hyperlipidemia: Secondary | ICD-10-CM | POA: Diagnosis not present

## 2018-06-16 DIAGNOSIS — G4730 Sleep apnea, unspecified: Secondary | ICD-10-CM | POA: Diagnosis not present

## 2018-06-16 DIAGNOSIS — I340 Nonrheumatic mitral (valve) insufficiency: Secondary | ICD-10-CM | POA: Diagnosis not present

## 2018-06-16 DIAGNOSIS — I509 Heart failure, unspecified: Secondary | ICD-10-CM | POA: Diagnosis not present

## 2018-06-18 ENCOUNTER — Ambulatory Visit: Payer: Self-pay | Admitting: Family Medicine

## 2018-06-22 ENCOUNTER — Other Ambulatory Visit: Payer: Self-pay

## 2018-06-22 ENCOUNTER — Ambulatory Visit (INDEPENDENT_AMBULATORY_CARE_PROVIDER_SITE_OTHER): Payer: Medicare Other

## 2018-06-22 ENCOUNTER — Ambulatory Visit (INDEPENDENT_AMBULATORY_CARE_PROVIDER_SITE_OTHER): Payer: Medicare Other | Admitting: Family Medicine

## 2018-06-22 VITALS — BP 136/62 | HR 71 | Temp 98.7°F | Ht 68.0 in | Wt 203.4 lb

## 2018-06-22 DIAGNOSIS — E1122 Type 2 diabetes mellitus with diabetic chronic kidney disease: Secondary | ICD-10-CM

## 2018-06-22 DIAGNOSIS — Z0000 Encounter for general adult medical examination without abnormal findings: Secondary | ICD-10-CM

## 2018-06-22 DIAGNOSIS — I5032 Chronic diastolic (congestive) heart failure: Secondary | ICD-10-CM | POA: Diagnosis not present

## 2018-06-22 DIAGNOSIS — N183 Chronic kidney disease, stage 3 unspecified: Secondary | ICD-10-CM

## 2018-06-22 DIAGNOSIS — E6609 Other obesity due to excess calories: Secondary | ICD-10-CM | POA: Diagnosis not present

## 2018-06-22 DIAGNOSIS — E039 Hypothyroidism, unspecified: Secondary | ICD-10-CM

## 2018-06-22 DIAGNOSIS — E782 Mixed hyperlipidemia: Secondary | ICD-10-CM

## 2018-06-22 DIAGNOSIS — I10 Essential (primary) hypertension: Secondary | ICD-10-CM

## 2018-06-22 DIAGNOSIS — Z6832 Body mass index (BMI) 32.0-32.9, adult: Secondary | ICD-10-CM | POA: Diagnosis not present

## 2018-06-22 LAB — POCT GLYCOSYLATED HEMOGLOBIN (HGB A1C)
Est. average glucose Bld gHb Est-mCnc: 197
Hemoglobin A1C: 8.5 % — AB (ref 4.0–5.6)

## 2018-06-22 NOTE — Progress Notes (Signed)
Patient: Randy Dalton Male    DOB: 1927/02/24   85 y.o.   MRN: [id number] Visit Date: 06/22/2018  Today's Provider: [name], MD   Chief Complaint  Patient presents with  . Follow up to [facility]   Subjective:   Patient had AWV with [facility] today.  HPI  Hypertension, follow-up:  BP Readings from Last 3 Encounters:  06/22/18 136/62  02/18/18 132/76  01/27/18 (!) 154/79    He was last seen for hypertension 6 months ago.  BP at that visit was 132/76. Management since that visit includes no changes. He reports good compliance with treatment. He is not having side effects.  He is not exercising. He is adherent to low salt diet.   Outside blood pressures are checked occasionally. He is experiencing none.  Patient denies exertional chest pressure/discomfort, lower extremity edema and palpitations.   Cardiovascular risk factors include dyslipidemia.   Weight trend: stable Wt Readings from Last 3 Encounters:  06/22/18 203 lb 6.4 oz (92.3 kg)  02/18/18 202 lb (91.6 kg)  01/27/18 212 lb 6.4 oz (96.3 kg)    Current diet: well balanced   Lipid/Cholesterol, Follow-up:   Last seen for this 6 months ago.  Management changes since that visit include no ch. . Last Lipid Panel:    Component Value Date/Time   CHOL 143 02/18/2018 1049   TRIG 135 02/18/2018 1049   HDL 36 (L) 02/18/2018 1049   Chical 80 02/18/2018 1049    Risk factors for vascular disease include hypertension  He reports good compliance with treatment. He is not having side effects.  Current symptoms include none and have been stable.  Allergies  Allergen Reactions  . Aspirin Other (See Comments)    Feel bad Feel bad  . Codeine Nausea Only     Current Outpatient Medications:  .  amLODipine (NORVASC) 10 MG tablet, TAKE 1 TABLET(10 MG) BY MOUTH DAILY, Disp: 90 tablet, Rfl: 3 .  carvedilol (COREG) 12.5 MG tablet, TAKE 1 TABLET BY MOUTH TWICE DAILY, Disp: 180 tablet, Rfl: 3 .   Cholecalciferol (VITAMIN D) 2000 UNITS tablet, Take 2,000 Units by mouth daily. , Disp: , Rfl:  .  glimepiride (AMARYL) 4 MG tablet, TAKE 1 TABLET BY MOUTH EVERY DAY, Disp: 90 tablet, Rfl: 3 .  hydrALAZINE (APRESOLINE) 50 MG tablet, TAKE 1 TABLET BY MOUTH TWICE DAILY, Disp: 60 tablet, Rfl: 2 .  levothyroxine (SYNTHROID, LEVOTHROID) 175 MCG tablet, TAKE 1 TABLET BY MOUTH EVERY DAY, Disp: 90 tablet, Rfl: 3 .  linagliptin (TRADJENTA) 5 MG TABS tablet, Take 1 tablet (5 mg total) by mouth daily., Disp: 30 tablet, Rfl: 11 .  losartan (COZAAR) 50 MG tablet, Take 1 tablet (50 mg total) by mouth daily., Disp: 30 tablet, Rfl: 0 .  meloxicam (MOBIC) 7.5 MG tablet, Take 1 tablet (7.5 mg total) by mouth 2 (two) times daily. (Patient not taking: Reported on 06/22/2018), Disp: 28 tablet, Rfl: 0 .  Multiple Vitamin (MULTIVITAMIN) tablet, Take 1 tablet by mouth daily., Disp: , Rfl:  .  potassium chloride SA (K-DUR,KLOR-CON) 20 MEQ tablet, Take 1 tablet by mouth every other day. , Disp: , Rfl: 1 .  sildenafil (VIAGRA) 100 MG tablet, Take 100 mg by mouth as needed. , Disp: , Rfl:  .  simvastatin (ZOCOR) 20 MG tablet, TAKE ONE TABLET BY MOUTH AT BEDTIME., Disp: 90 tablet, Rfl: 3 .  torsemide (DEMADEX) 20 MG tablet, Take 20 mg by mouth daily., Disp: ,  Rfl: 1  Review of Systems  Constitutional: Negative.   HENT: Negative.   Eyes: Negative.   Respiratory: Negative for cough and shortness of breath.   Cardiovascular: Negative for chest pain, palpitations and leg swelling.  Gastrointestinal: Negative.   Endocrine: Negative.   Genitourinary: Negative.   Musculoskeletal: Negative for arthralgias, myalgias, neck pain and neck stiffness.  Allergic/Immunologic: Negative.   Neurological: Negative.   Hematological: Negative.   Psychiatric/Behavioral: Negative.  Negative for agitation, self-injury, sleep disturbance and suicidal ideas.    Social History   Tobacco Use  . Smoking status: Never Smoker  . Smokeless  tobacco: Never Used  Substance Use Topics  . Alcohol use: No    Alcohol/week: 0.0 standard drinks      Objective:   There were no vitals taken for this visit. There were no vitals filed for this visit.   Physical Exam Vitals signs reviewed.  Constitutional:      Appearance: He is well-developed.  HENT:     Head: Normocephalic and atraumatic.     Right Ear: External ear normal.     Left Ear: External ear normal.     Nose: Nose normal.  Eyes:     General: No scleral icterus.    Conjunctiva/sclera: Conjunctivae normal.  Neck:     Thyroid: No thyromegaly.  Cardiovascular:     Rate and Rhythm: Normal rate and regular rhythm.     Heart sounds: Normal heart sounds.  Pulmonary:     Effort: Pulmonary effort is normal.     Breath sounds: Normal breath sounds.  Abdominal:     Palpations: Abdomen is soft.  Musculoskeletal:     Comments: Trace edema.  Skin:    General: Skin is warm and dry.  Neurological:     General: No focal deficit present.     Mental Status: He is alert and oriented to person, place, and time. Mental status is at baseline.     Comments: Monofilament exam normal.  Psychiatric:        Mood and Affect: Mood normal.        Behavior: Behavior normal.        Thought Content: Thought content normal.        Judgment: Judgment normal.         Assessment & Plan    1. Type 2 diabetes mellitus with stage 3 chronic kidney disease, without long-term current use of insulin (HCC) 8.5 today--was 8.6 Pt to try to work on habits to goal A1C less than 7.5 to8.0 - POCT glycosylated hemoglobin (Hb A1C)  2. Essential (primary) hypertension Controlled. On amlodipine--pt intolerant of ACEI.  3. Mixed hyperlipidemia On zocor  4. Chronic diastolic heart failure (HCC) Stable  5. Adult hypothyroidism Check TSH.  6. Class 1 obesity due to excess calories with serious comorbidity and body mass index (BMI) of 32.0 to 32.9 in adult With DM/HTN.    I have done the exam  and reviewed the above chart and it is accurate to the best of my knowledge. Development worker, community has been used in this note in any air is in the dictation or transcription are unintentional.  [name], MD  [place]

## 2018-06-22 NOTE — Progress Notes (Signed)
Subjective:   Randy Dalton is a 85 y.o. male who presents for Medicare Annual/Subsequent preventive examination.  Review of Systems:  N/A  Cardiac Risk Factors include: advanced age (>68men, >72 women);diabetes mellitus;dyslipidemia;obesity (BMI >30kg/m2);hypertension;male gender     Objective:    Vitals: BP 136/62 (BP Location: Right Arm)    Pulse 71    Temp 98.7 F (37.1 C) (Oral)    Ht 5\' 8"  (1.727 m)    Wt 203 lb 6.4 oz (92.3 kg)    BMI 30.93 kg/m   Body mass index is 30.93 kg/m.  Advanced Directives 06/22/2018 06/16/2017 03/12/2017 01/28/2017 01/28/2017 05/15/2016 03/23/2015  Does Patient Have a Medical Advance Directive? No No No Yes Yes No Yes  Type of Advance Directive - - Public librarian;Living will [place];Living will - Living will  Does patient want to make changes to medical advance directive? - - - No - Patient declined - - -  Would patient like information on creating a medical advance directive? No - Patient declined No - Patient declined - - - No - Patient declined -    Tobacco Social History   Tobacco Use  Smoking Status Never Smoker  Smokeless Tobacco Never Used     Counseling given: Not Answered   Clinical Intake:  Pre-visit preparation completed: Yes  Pain : No/denies pain Pain Score: 0-No pain     Nutritional Status: BMI > 30  Obese Nutritional Risks: None Diabetes: Yes  How often do you need to have someone help you when you read instructions, pamphlets, or other written materials from your doctor or pharmacy?: 1 - Never   Diabetes:  Is the patient diabetic?  Yes type 2 If diabetic, was a CBG obtained today?  No  Did the patient bring in their glucometer from home?  No  How often do you monitor your CBG's? Occasionally when he feels he needs to..   Financial Strains and Diabetes Management:  Are you having any financial strains with the device, your supplies or your medication? No .  Does the patient want  to be seen by Chronic Care Management for management of their diabetes?  No  Would the patient like to be referred to a Nutritionist or for Diabetic Management?  No   Diabetic Exams:  Diabetic Eye Exam: Completed 05/08/07. Overdue for diabetic eye exam. Pt has been advised about the importance in completing this exam. Pt plans to set up eye exam this year.   Diabetic Foot Exam: Completed 02/20/18. Repeat yearly.  Interpreter Needed?: No  Information entered by :: [facility], LPN  Past Medical History:  Diagnosis Date   CHF (congestive heart failure) ([place])    Chronic kidney disease    Diabetes mellitus without complication ([place])    Diverticulosis    GERD (gastroesophageal reflux disease)    Hypercholesteremia    Hypertension    Hypothyroidism    Obstructive sleep apnea    CPAP   Wears dentures    partial top   Past Surgical History:  Procedure Laterality Date   BACK SURGERY     CATARACT EXTRACTION W/PHACO Left 10/24/2014   Procedure: CATARACT EXTRACTION PHACO AND INTRAOCULAR LENS PLACEMENT ([place]);  Surgeon: Ronnell Freshwater, MD;  Location: [facility];  Service: Ophthalmology;  Laterality: Left;  DIABETIC - oral meds   CATARACT EXTRACTION W/PHACO Right 06/17/2017   Procedure: CATARACT EXTRACTION PHACO AND INTRAOCULAR LENS PLACEMENT ([facility]);  Surgeon: Birder Robson, MD;  Location: [facility];  Service: Ophthalmology;  Laterality: Right;  [place] 01:43 AP% 19.7 CDE 20.48 Fluid pack lot # [id number]   COLONOSCOPY     HERNIA REPAIR     PILONIDAL CYST EXCISION     STOMACH SURGERY     Family History  Problem Relation Age of Onset   Hypertension Mother    Kidney failure Mother    Diabetes Father    Lung cancer Brother    Colon cancer Brother    Cancer Brother        skin, removed   Dementia Sister    Dementia Sister    Hypertension Sister    Social History   Socioeconomic History   Marital status: Married    Spouse name: Not on file    Number of children: 1   Years of education: Not on file   Highest education level: High school graduate  Occupational History   Occupation: retired  Scientist, product/process development strain: Not hard at all   Food insecurity    Worry: Never true    Inability: Never true   Transportation needs    Medical: No    Non-medical: No  Tobacco Use   Smoking status: Never Smoker   Smokeless tobacco: Never Used  Substance and Sexual Activity   Alcohol use: No    Alcohol/week: 0.0 standard drinks   Drug use: No   Sexual activity: Not Currently  Lifestyle   Physical activity    Days per week: 0 days    Minutes per session: 0 min   Stress: Not at all  Relationships   Social connections    Talks on phone: Patient refused    Gets together: Patient refused    Attends religious service: Patient refused    Active member of club or organization: Patient refused    Attends meetings of clubs or organizations: Patient refused    Relationship status: Patient refused  Other Topics Concern   Not on file  Social History Narrative   Not on file    Outpatient Encounter Medications as of 06/22/2018  Medication Sig   amLODipine (NORVASC) 10 MG tablet TAKE 1 TABLET(10 MG) BY MOUTH DAILY   carvedilol (COREG) 12.5 MG tablet TAKE 1 TABLET BY MOUTH TWICE DAILY   Cholecalciferol (VITAMIN D) 2000 UNITS tablet Take 2,000 Units by mouth daily.    glimepiride (AMARYL) 4 MG tablet TAKE 1 TABLET BY MOUTH EVERY DAY   hydrALAZINE (APRESOLINE) 50 MG tablet TAKE 1 TABLET BY MOUTH TWICE DAILY   levothyroxine (SYNTHROID, LEVOTHROID) 175 MCG tablet TAKE 1 TABLET BY MOUTH EVERY DAY   linagliptin (TRADJENTA) 5 MG TABS tablet Take 1 tablet (5 mg total) by mouth daily.   losartan (COZAAR) 50 MG tablet Take 1 tablet (50 mg total) by mouth daily.   Multiple Vitamin (MULTIVITAMIN) tablet Take 1 tablet by mouth daily.   sildenafil (VIAGRA) 100 MG tablet Take 100 mg by mouth as needed.     simvastatin (ZOCOR) 20 MG tablet TAKE ONE TABLET BY MOUTH AT BEDTIME.   torsemide (DEMADEX) 20 MG tablet Take 20 mg by mouth daily.   meloxicam (MOBIC) 7.5 MG tablet Take 1 tablet (7.5 mg total) by mouth 2 (two) times daily. (Patient not taking: Reported on 06/22/2018)   potassium chloride SA (K-DUR,KLOR-CON) 20 MEQ tablet Take 1 tablet by mouth every other day.    No facility-administered encounter medications on file as of 06/22/2018.     Activities of Daily Living In your present state of  health, do you have any difficulty performing the following activities: 06/22/2018 09/15/2017  Hearing? N N  Vision? N N  Comment Wears eye glasses. -  Difficulty concentrating or making decisions? N N  Walking or climbing stairs? N N  Dressing or bathing? N N  Doing errands, shopping? N N  Preparing Food and eating ? N -  Using the Toilet? N -  In the past six months, have you accidently leaked urine? N -  Do you have problems with loss of bowel control? N -  Managing your Medications? N -  Managing your Finances? N -  Housekeeping or managing your Housekeeping? N -  Some recent data might be hidden    Patient Care Team: Jerrol Banana., MD as PCP - General (Family Medicine) Lavonia Dana, MD as Consulting Physician (Internal Medicine) Dionisio David, MD as Consulting Physician (Cardiology) Birder Robson, MD as Referring Physician (Ophthalmology)   Assessment:   This is a routine wellness examination for [place].  Exercise Activities and Dietary recommendations Current Exercise Habits: The patient does not participate in regular exercise at present, Exercise limited by: None identified  Goals     Increase water intake     Recommend increasing water intake to 4-6 glasses a day.     Weight (lb) < 200 lb (90.7 kg)     Recommend to continue current diet plan of cutting out all fatty foods to help aid in weight loss.        Fall Risk Fall Risk  06/22/2018 09/15/2017 06/16/2017  03/10/2017 02/06/2017  Falls in the past year? 0 No No No No   FALL RISK PREVENTION PERTAINING TO THE HOME:  Any stairs in or around the home? Yes  If so, are there any without handrails? No   Home free of loose throw rugs in walkways, pet beds, electrical cords, etc? Yes  Adequate lighting in your home to reduce risk of falls? Yes   ASSISTIVE DEVICES UTILIZED TO PREVENT FALLS:  Life alert? No  Use of a cane, walker or w/c? No  Grab bars in the bathroom? No  Shower chair or bench in shower? No  Elevated toilet seat or a handicapped toilet? No    TIMED UP AND GO:  Was the test performed? No .    Depression Screen PHQ 2/9 Scores 06/22/2018 09/15/2017 06/16/2017 03/10/2017  PHQ - 2 Score 0 0 0 0  PHQ- 9 Score - - - -    Cognitive Function     6CIT Screen 06/22/2018 05/15/2016  What Year? 0 points 0 points  What month? 0 points 0 points  What time? 0 points 0 points  Count back from 20 0 points 0 points  Months in reverse 4 points 4 points  Repeat phrase 4 points 2 points  Total Score 8 6    Immunization History  Administered Date(s) Administered   Influenza, High Dose Seasonal PF 11/23/2014, 10/23/2015, 10/31/2016, 10/15/2017   Pneumococcal Conjugate-13 12/13/2013   Pneumococcal Polysaccharide-23 06/08/2012   Td 04/28/2003    Qualifies for Shingles Vaccine? Yes . Due for Shingrix. Education has been provided regarding the importance of this vaccine. Pt has been advised to call insurance company to determine out of pocket expense. Advised may also receive vaccine at local pharmacy or Health Dept. Verbalized acceptance and understanding.  Tdap: Although this vaccine is not a covered service during a Wellness Exam, does the patient still wish to receive this vaccine today?  No .  Education  has been provided regarding the importance of this vaccine. Advised may receive this vaccine at local pharmacy or Health Dept. Aware to provide a copy of the vaccination record if obtained  from local pharmacy or Health Dept. Verbalized acceptance and understanding.  Flu Vaccine: Up to date  Pneumococcal Vaccine: Completed series  Screening Tests Health Maintenance  Topic Date Due   OPHTHALMOLOGY EXAM  05/08/2018   TETANUS/TDAP  01/07/2026 (Originally 04/27/2013)   INFLUENZA VACCINE  08/08/2018   HEMOGLOBIN A1C  08/19/2018   FOOT EXAM  02/21/2019   PNA vac Low Risk Adult  Completed   Cancer Screenings:  Colorectal Screening: No longer required.   Lung Cancer Screening: (Low Dose CT Chest recommended if Age 63-80 years, 30 pack-year currently smoking OR have quit w/in 15years.) does not qualify.   Additional Screening:  Dental Screening: Recommended annual dental exams for proper oral hygiene  Community Resource Referral:  CRR required this visit?  No        Plan:  I have personally reviewed and addressed the Medicare Annual Wellness questionnaire and have noted the following in the patients chart:  A. Medical and social history B. Use of alcohol, tobacco or illicit drugs  C. Current medications and supplements D. Functional ability and status E.  Nutritional status F.  Physical activity G. Advance directives H. List of other physicians I.  Hospitalizations, surgeries, and ER visits in previous 12 months J.  [place] such as hearing and vision if needed, cognitive and depression L. Referrals and appointments   In addition, I have reviewed and discussed with patient certain preventive protocols, quality metrics, and best practice recommendations. A written personalized care plan for preventive services as well as general preventive health recommendations were provided to patient.   [place] Score, [place]  [id number] Nurse Health Advisor  Nurse Notes: Pt plans to set up an eye exam this year.

## 2018-06-22 NOTE — Patient Instructions (Signed)
Randy Dalton , Thank you for taking time to come for your Medicare Wellness Visit. I appreciate your ongoing commitment to your health goals. Please review the following plan we discussed and let me know if I can assist you in the future.   Screening recommendations/referrals: Colonoscopy: No longer required.  Recommended yearly ophthalmology/optometry visit for glaucoma screening and checkup Recommended yearly dental visit for hygiene and checkup  Vaccinations: Influenza vaccine: Up to date Pneumococcal vaccine: Completed series Tdap vaccine: Pt declines today.  Shingles vaccine: Pt declines today.     Advanced directives: Advance directive discussed with you today. Even though you declined this today please call our office should you change your mind and we can give you the proper paperwork for you to fill out.  Conditions/risks identified: Continue to increase water intake to 6-8 8 oz glasses a day and continue to cut back on fatty foods to help aid in weight loss.   Next appointment: 10:00 AM with Dr [name].   Preventive Care 28 Years and Older, Male Preventive care refers to lifestyle choices and visits with your health care provider that can promote health and wellness. What does preventive care include?  A yearly physical exam. This is also called an annual well check.  Dental exams once or twice a year.  Routine eye exams. Ask your health care provider how often you should have your eyes checked.  Personal lifestyle choices, including:  Daily care of your teeth and gums.  Regular physical activity.  Eating a healthy diet.  Avoiding tobacco and drug use.  Limiting alcohol use.  Practicing safe sex.  Taking low doses of aspirin every day.  Taking vitamin and mineral supplements as recommended by your health care provider. What happens during an annual well check? The services and screenings done by your health care provider during your annual well check will depend  on your age, overall health, lifestyle risk factors, and family history of disease. Counseling  Your health care provider may ask you questions about your:  Alcohol use.  Tobacco use.  Drug use.  Emotional well-being.  Home and relationship well-being.  Sexual activity.  Eating habits.  History of falls.  Memory and ability to understand (cognition).  Work and work Statistician. Screening  You may have the following tests or measurements:  Height, weight, and BMI.  Blood pressure.  Lipid and cholesterol levels. These may be checked every 5 years, or more frequently if you are over 66 years old.  Skin check.  Lung cancer screening. You may have this screening every year starting at age 96 if you have a 30-pack-year history of smoking and currently smoke or have quit within the past 15 years.  Fecal occult blood test (FOBT) of the stool. You may have this test every year starting at age 61.  Flexible sigmoidoscopy or colonoscopy. You may have a sigmoidoscopy every 5 years or a colonoscopy every 10 years starting at age 8.  Prostate cancer screening. Recommendations will vary depending on your family history and other risks.  Hepatitis C blood test.  Hepatitis B blood test.  Sexually transmitted disease (STD) testing.  Diabetes screening. This is done by checking your blood sugar (glucose) after you have not eaten for a while (fasting). You may have this done every 1-3 years.  Abdominal aortic aneurysm (AAA) screening. You may need this if you are a current or former smoker.  Osteoporosis. You may be screened starting at age 38 if you are at high risk.  Talk with your health care provider about your test results, treatment options, and if necessary, the need for more tests. Vaccines  Your health care provider may recommend certain vaccines, such as:  Influenza vaccine. This is recommended every year.  Tetanus, diphtheria, and acellular pertussis (Tdap, Td)  vaccine. You may need a Td booster every 10 years.  Zoster vaccine. You may need this after age 71.  Pneumococcal 13-valent conjugate (PCV13) vaccine. One dose is recommended after age 12.  Pneumococcal polysaccharide (PPSV23) vaccine. One dose is recommended after age 72. Talk to your health care provider about which screenings and vaccines you need and how often you need them. This information is not intended to replace advice given to you by your health care provider. Make sure you discuss any questions you have with your health care provider. Document Released: 01/20/2015 Document Revised: 09/13/2015 Document Reviewed: 10/25/2014 Elsevier Interactive Patient Education  2017 [employer] Prevention in the Home Falls can cause injuries. They can happen to people of all ages. There are many things you can do to make your home safe and to help prevent falls. What can I do on the outside of my home?  Regularly fix the edges of walkways and driveways and fix any cracks.  Remove anything that might make you trip as you walk through a door, such as a raised step or threshold.  Trim any bushes or trees on the path to your home.  Use bright outdoor lighting.  Clear any walking paths of anything that might make someone trip, such as rocks or tools.  Regularly check to see if handrails are loose or broken. Make sure that both sides of any steps have handrails.  Any raised decks and porches should have guardrails on the edges.  Have any leaves, snow, or ice cleared regularly.  Use sand or salt on walking paths during winter.  Clean up any spills in your garage right away. This includes oil or grease spills. What can I do in the bathroom?  Use night lights.  Install grab bars by the toilet and in the tub and shower. Do not use towel bars as grab bars.  Use non-skid mats or decals in the tub or shower.  If you need to sit down in the shower, use a plastic, non-slip stool.   Keep the floor dry. Clean up any water that spills on the floor as soon as it happens.  Remove soap buildup in the tub or shower regularly.  Attach bath mats securely with double-sided non-slip rug tape.  Do not have throw rugs and other things on the floor that can make you trip. What can I do in the bedroom?  Use night lights.  Make sure that you have a light by your bed that is easy to reach.  Do not use any sheets or blankets that are too big for your bed. They should not hang down onto the floor.  Have a firm chair that has side arms. You can use this for support while you get dressed.  Do not have throw rugs and other things on the floor that can make you trip. What can I do in the kitchen?  Clean up any spills right away.  Avoid walking on wet floors.  Keep items that you use a lot in easy-to-reach places.  If you need to reach something above you, use a strong step stool that has a grab bar.  Keep electrical cords out of the way.  Do not use floor polish or wax that makes floors slippery. If you must use wax, use non-skid floor wax.  Do not have throw rugs and other things on the floor that can make you trip. What can I do with my stairs?  Do not leave any items on the stairs.  Make sure that there are handrails on both sides of the stairs and use them. Fix handrails that are broken or loose. Make sure that handrails are as long as the stairways.  Check any carpeting to make sure that it is firmly attached to the stairs. Fix any carpet that is loose or worn.  Avoid having throw rugs at the top or bottom of the stairs. If you do have throw rugs, attach them to the floor with carpet tape.  Make sure that you have a light switch at the top of the stairs and the bottom of the stairs. If you do not have them, ask someone to add them for you. What else can I do to help prevent falls?  Wear shoes that:  Do not have high heels.  Have rubber bottoms.  Are  comfortable and fit you well.  Are closed at the toe. Do not wear sandals.  If you use a stepladder:  Make sure that it is fully opened. Do not climb a closed stepladder.  Make sure that both sides of the stepladder are locked into place.  Ask someone to hold it for you, if possible.  Clearly mark and make sure that you can see:  Any grab bars or handrails.  First and last steps.  Where the edge of each step is.  Use tools that help you move around (mobility aids) if they are needed. These include:  Canes.  Walkers.  Scooters.  Crutches.  Turn on the lights when you go into a dark area. Replace any light bulbs as soon as they burn out.  Set up your furniture so you have a clear path. Avoid moving your furniture around.  If any of your floors are uneven, fix them.  If there are any pets around you, be aware of where they are.  Review your medicines with your doctor. Some medicines can make you feel dizzy. This can increase your chance of falling. Ask your doctor what other things that you can do to help prevent falls. This information is not intended to replace advice given to you by your health care provider. Make sure you discuss any questions you have with your health care provider. Document Released: 10/20/2008 Document Revised: 06/01/2015 Document Reviewed: 01/28/2014 Elsevier Interactive Patient Education  2017 [employer].

## 2018-06-29 DIAGNOSIS — M76821 Posterior tibial tendinitis, right leg: Secondary | ICD-10-CM | POA: Diagnosis not present

## 2018-06-29 DIAGNOSIS — E119 Type 2 diabetes mellitus without complications: Secondary | ICD-10-CM | POA: Diagnosis not present

## 2018-06-29 DIAGNOSIS — M19071 Primary osteoarthritis, right ankle and foot: Secondary | ICD-10-CM | POA: Diagnosis not present

## 2018-08-10 ENCOUNTER — Other Ambulatory Visit: Payer: Self-pay | Admitting: Family Medicine

## 2018-08-10 MED ORDER — LEVOTHYROXINE SODIUM 175 MCG PO TABS: 175.0000 ug | ORAL_TABLET | Freq: Every day | ORAL | 1 refills | Status: DC

## 2018-08-10 NOTE — Telephone Encounter (Signed)
[facility] faxed refill request for the following medications:  levothyroxine (SYNTHROID, LEVOTHROID) 175 MCG tablet   Please advise.

## 2018-09-03 DIAGNOSIS — E1122 Type 2 diabetes mellitus with diabetic chronic kidney disease: Secondary | ICD-10-CM | POA: Diagnosis not present

## 2018-09-03 DIAGNOSIS — E785 Hyperlipidemia, unspecified: Secondary | ICD-10-CM | POA: Diagnosis not present

## 2018-09-03 DIAGNOSIS — N183 Chronic kidney disease, stage 3 (moderate): Secondary | ICD-10-CM | POA: Diagnosis not present

## 2018-09-03 DIAGNOSIS — R809 Proteinuria, unspecified: Secondary | ICD-10-CM | POA: Diagnosis not present

## 2018-09-03 DIAGNOSIS — I129 Hypertensive chronic kidney disease with stage 1 through stage 4 chronic kidney disease, or unspecified chronic kidney disease: Secondary | ICD-10-CM | POA: Diagnosis not present

## 2018-09-09 DIAGNOSIS — I129 Hypertensive chronic kidney disease with stage 1 through stage 4 chronic kidney disease, or unspecified chronic kidney disease: Secondary | ICD-10-CM | POA: Diagnosis not present

## 2018-09-09 DIAGNOSIS — E1122 Type 2 diabetes mellitus with diabetic chronic kidney disease: Secondary | ICD-10-CM | POA: Diagnosis not present

## 2018-09-09 DIAGNOSIS — N184 Chronic kidney disease, stage 4 (severe): Secondary | ICD-10-CM | POA: Diagnosis not present

## 2018-09-09 DIAGNOSIS — N2581 Secondary hyperparathyroidism of renal origin: Secondary | ICD-10-CM | POA: Diagnosis not present

## 2018-09-17 DIAGNOSIS — I351 Nonrheumatic aortic (valve) insufficiency: Secondary | ICD-10-CM | POA: Diagnosis not present

## 2018-09-17 DIAGNOSIS — I340 Nonrheumatic mitral (valve) insufficiency: Secondary | ICD-10-CM | POA: Diagnosis not present

## 2018-09-17 DIAGNOSIS — N184 Chronic kidney disease, stage 4 (severe): Secondary | ICD-10-CM | POA: Diagnosis not present

## 2018-09-17 DIAGNOSIS — I509 Heart failure, unspecified: Secondary | ICD-10-CM | POA: Diagnosis not present

## 2018-10-08 ENCOUNTER — Other Ambulatory Visit: Payer: Self-pay | Admitting: Family Medicine

## 2018-10-08 DIAGNOSIS — R809 Proteinuria, unspecified: Secondary | ICD-10-CM | POA: Diagnosis not present

## 2018-10-08 DIAGNOSIS — I129 Hypertensive chronic kidney disease with stage 1 through stage 4 chronic kidney disease, or unspecified chronic kidney disease: Secondary | ICD-10-CM | POA: Diagnosis not present

## 2018-10-08 DIAGNOSIS — E1122 Type 2 diabetes mellitus with diabetic chronic kidney disease: Secondary | ICD-10-CM | POA: Diagnosis not present

## 2018-10-08 DIAGNOSIS — E789 Disorder of lipoprotein metabolism, unspecified: Secondary | ICD-10-CM | POA: Diagnosis not present

## 2018-10-08 DIAGNOSIS — N183 Chronic kidney disease, stage 3 unspecified: Secondary | ICD-10-CM | POA: Diagnosis not present

## 2018-10-21 ENCOUNTER — Ambulatory Visit (INDEPENDENT_AMBULATORY_CARE_PROVIDER_SITE_OTHER): Payer: Medicare Other | Admitting: Family Medicine

## 2018-10-21 ENCOUNTER — Encounter: Payer: Self-pay | Admitting: Family Medicine

## 2018-10-21 ENCOUNTER — Other Ambulatory Visit: Payer: Self-pay

## 2018-10-21 VITALS — BP 136/74 | HR 61 | Temp 96.9°F | Resp 18 | Wt 211.6 lb

## 2018-10-21 DIAGNOSIS — G4733 Obstructive sleep apnea (adult) (pediatric): Secondary | ICD-10-CM | POA: Diagnosis not present

## 2018-10-21 DIAGNOSIS — Z23 Encounter for immunization: Secondary | ICD-10-CM

## 2018-10-21 DIAGNOSIS — E1122 Type 2 diabetes mellitus with diabetic chronic kidney disease: Secondary | ICD-10-CM

## 2018-10-21 DIAGNOSIS — E1121 Type 2 diabetes mellitus with diabetic nephropathy: Secondary | ICD-10-CM | POA: Diagnosis not present

## 2018-10-21 DIAGNOSIS — I10 Essential (primary) hypertension: Secondary | ICD-10-CM | POA: Diagnosis not present

## 2018-10-21 DIAGNOSIS — N1831 Chronic kidney disease, stage 3a: Secondary | ICD-10-CM

## 2018-10-21 LAB — POCT GLYCOSYLATED HEMOGLOBIN (HGB A1C): Hemoglobin A1C: 8.5 % — AB (ref 4.0–5.6)

## 2018-10-21 LAB — POCT UA - MICROALBUMIN: Microalbumin Ur, POC: 20 mg/L

## 2018-10-21 NOTE — Patient Instructions (Signed)
1.Stop taking Meloxicam

## 2018-10-21 NOTE — Progress Notes (Signed)
Patient: Randy Dalton Male    DOB: Jun 07, 1927   85 y.o.   MRN: [id number] Visit Date: 10/21/2018  Today's Provider: [name], MD   Chief Complaint  Patient presents with  . Diabetes   Subjective:     HPI  4 month follow up for diabetes. Pt feels well--no complaints.   Diabetes Mellitus Type II, Follow-up:   Lab Results  Component Value Date   HGBA1C 8.5 (A) 10/21/2018   HGBA1C 8.5 (A) 06/22/2018   HGBA1C 8.6 (A) 02/18/2018    Last seen for diabetes 4 months ago.  Management since then includes . He reports good compliance with treatment. He is not having side effects.  Current symptoms include none and have been stable. Home blood sugar records: fasting range: 100-140  Episodes of hypoglycemia? no   Current insulin regiment: Is not on insulin Most Recent Eye Exam:  Weight trend: stable Prior visit with dietician: Yes  Current exercise: none Current diet habits: well balanced  Pertinent Labs:    Component Value Date/Time   CHOL 143 02/18/2018 1049   TRIG 135 02/18/2018 1049   HDL 36 (L) 02/18/2018 1049   LDLCALC 80 02/18/2018 1049   CREATININE 1.94 (H) 02/18/2018 1049    Wt Readings from Last 3 Encounters:  10/21/18 211 lb 9.6 oz (96 kg)  06/22/18 203 lb 6.4 oz (92.3 kg)  02/18/18 202 lb (91.6 kg)    ------------------------------------------------------------------------  Allergies  Allergen Reactions  . Aspirin Other (See Comments)    Feel bad Feel bad  . Codeine Nausea Only     Current Outpatient Medications:  .  amLODipine (NORVASC) 10 MG tablet, TAKE 1 TABLET(10 MG) BY MOUTH DAILY, Disp: 90 tablet, Rfl: 3 .  carvedilol (COREG) 12.5 MG tablet, TAKE 1 TABLET BY MOUTH TWICE DAILY, Disp: 180 tablet, Rfl: 3 .  Cholecalciferol (VITAMIN D) 2000 UNITS tablet, Take 2,000 Units by mouth daily. , Disp: , Rfl:  .  glimepiride (AMARYL) 4 MG tablet, TAKE 1 TABLET BY MOUTH EVERY DAY, Disp: 90 tablet, Rfl: 3 .  hydrALAZINE (APRESOLINE)  50 MG tablet, TAKE 1 TABLET BY MOUTH TWICE DAILY, Disp: 60 tablet, Rfl: 2 .  levothyroxine (SYNTHROID) 175 MCG tablet, Take 1 tablet (175 mcg total) by mouth daily., Disp: 90 tablet, Rfl: 1 .  linagliptin (TRADJENTA) 5 MG TABS tablet, Take 1 tablet (5 mg total) by mouth daily., Disp: 30 tablet, Rfl: 11 .  losartan (COZAAR) 50 MG tablet, Take 1 tablet (50 mg total) by mouth daily., Disp: 30 tablet, Rfl: 0 .  meloxicam (MOBIC) 7.5 MG tablet, Take 1 tablet (7.5 mg total) by mouth 2 (two) times daily. (Patient not taking: Reported on 06/22/2018), Disp: 28 tablet, Rfl: 0 .  Multiple Vitamin (MULTIVITAMIN) tablet, Take 1 tablet by mouth daily., Disp: , Rfl:  .  potassium chloride SA (K-DUR,KLOR-CON) 20 MEQ tablet, Take 1 tablet by mouth every other day. , Disp: , Rfl: 1 .  sildenafil (VIAGRA) 100 MG tablet, Take 100 mg by mouth as needed. , Disp: , Rfl:  .  simvastatin (ZOCOR) 20 MG tablet, TAKE ONE TABLET BY MOUTH AT BEDTIME., Disp: 90 tablet, Rfl: 3 .  torsemide (DEMADEX) 20 MG tablet, Take 20 mg by mouth daily., Disp: , Rfl: 1  Review of Systems  Constitutional: Negative.   HENT: Negative.   Eyes: Negative.   Respiratory: Negative for cough and shortness of breath.   Cardiovascular: Negative for chest pain, palpitations and  leg swelling.  Gastrointestinal: Negative.   Endocrine: Negative.   Musculoskeletal: Negative for arthralgias, myalgias, neck pain and neck stiffness.  Allergic/Immunologic: Negative.   Neurological: Negative.   Hematological: Negative.   Psychiatric/Behavioral: Negative for agitation, self-injury, sleep disturbance and suicidal ideas.    Social History   Tobacco Use  . Smoking status: Never Smoker  . Smokeless tobacco: Never Used  Substance Use Topics  . Alcohol use: No    Alcohol/week: 0.0 standard drinks      Objective:   BP 136/74 (BP Location: Right Arm, Patient Position: Sitting, Cuff Size: Large)   Pulse 61   Temp (!) 96.9 F (36.1 C) (Temporal)   Resp  18   Wt 211 lb 9.6 oz (96 kg)   SpO2 97%   BMI 32.17 kg/m  Vitals:   10/21/18 1010  BP: 136/74  Pulse: 61  Resp: 18  Temp: (!) 96.9 F (36.1 C)  TempSrc: Temporal  SpO2: 97%  Weight: 211 lb 9.6 oz (96 kg)  Body mass index is 32.17 kg/m.   Physical Exam Vitals signs reviewed.  Constitutional:      Appearance: He is well-developed.  HENT:     Head: Normocephalic and atraumatic.     Right Ear: External ear normal.     Left Ear: External ear normal.     Nose: Nose normal.  Eyes:     General: No scleral icterus.    Conjunctiva/sclera: Conjunctivae normal.  Neck:     Thyroid: No thyromegaly.  Cardiovascular:     Rate and Rhythm: Normal rate and regular rhythm.     Heart sounds: Normal heart sounds.  Pulmonary:     Effort: Pulmonary effort is normal.     Breath sounds: Normal breath sounds.  Abdominal:     Palpations: Abdomen is soft.  Musculoskeletal:     Comments: Trace edema.  Skin:    General: Skin is warm and dry.  Neurological:     General: No focal deficit present.     Mental Status: He is alert and oriented to person, place, and time. Mental status is at baseline.  Psychiatric:        Behavior: Behavior normal.        Thought Content: Thought content normal.        Judgment: Judgment normal.      Results for orders placed or performed in visit on 10/21/18  POCT HgB A1C  Result Value Ref Range   Hemoglobin A1C 8.5 (A) 4.0 - 5.6 %   HbA1c POC (<> result, manual entry)     HbA1c, POC (prediabetic range)     HbA1c, POC (controlled diabetic range)    POCT UA - Microalbumin  Result Value Ref Range   Microalbumin Ur, POC 20 mg/L   Creatinine, POC     Albumin/Creatinine Ratio, Urine, POC         Assessment & Plan    1. Type 2 diabetes mellitus with stage 3a chronic kidney disease, without long-term current use of insulin (HCC) Goal less than 8. More than 50% 25 minute visit spent in counseling or coordination of care  - POCT HgB A1C--8.5 - POCT UA  - Microalbumin--20--on Losartan  2. Need for influenza vaccination  - Flu vaccine HIGH DOSE PF  3. Essential (primary) hypertension   4. Obstructive sleep apnea syndrome On CPAP.  5. Morbid obesity ([place]) With DM/HTN     [name], MD  [facility]

## 2018-11-09 ENCOUNTER — Other Ambulatory Visit: Payer: Self-pay | Admitting: Family Medicine

## 2018-11-09 DIAGNOSIS — I10 Essential (primary) hypertension: Secondary | ICD-10-CM

## 2018-11-09 MED ORDER — AMLODIPINE BESYLATE 10 MG PO TABS: ORAL_TABLET | ORAL | 3 refills | Status: DC

## 2018-11-09 NOTE — Telephone Encounter (Signed)
[facility] faxed refill request for the following medications: ° °amLODipine (NORVASC) 10 MG tablet  ° ° °Please advise. °

## 2018-11-09 NOTE — Telephone Encounter (Signed)
Last office visit 10/21/18, medication has been filled. KW

## 2018-11-16 ENCOUNTER — Telehealth: Payer: Self-pay | Admitting: Family Medicine

## 2018-11-16 NOTE — Telephone Encounter (Signed)
We have not seen this yet. Will await fax.

## 2018-11-16 NOTE — Telephone Encounter (Signed)
Randy Dalton [phone] [facility]   Wanting to know if we received a back brace request from them for the pt.  Thanks, [employer]

## 2018-11-17 NOTE — Telephone Encounter (Signed)
Received fax. Placed in your stack to review. Thanks!

## 2018-11-18 ENCOUNTER — Telehealth: Payer: Self-pay | Admitting: Family Medicine

## 2018-11-18 NOTE — Telephone Encounter (Signed)
Left message with Arbie Cookey saying that Dr. [name] has not seen the patient for this, and that he did not request this. If so, he will need a face to face to document. Dr. [name] reports that this is medicare fraud.

## 2018-11-18 NOTE — Telephone Encounter (Signed)
I have not seen the pt for back issues. Tell her not to send another fax to me about anything that she is peddling. It is Medicare fraud.

## 2018-11-18 NOTE — Telephone Encounter (Signed)
Arbie Cookey w/ [facility]   [phone]  Checking to see if the office received a fax from them regarding pt's back brace.  Please call Arbie Cookey back at [phone].   Thanks, [employer]

## 2018-11-26 DIAGNOSIS — E1122 Type 2 diabetes mellitus with diabetic chronic kidney disease: Secondary | ICD-10-CM | POA: Diagnosis not present

## 2018-11-26 DIAGNOSIS — I129 Hypertensive chronic kidney disease with stage 1 through stage 4 chronic kidney disease, or unspecified chronic kidney disease: Secondary | ICD-10-CM | POA: Diagnosis not present

## 2018-11-26 DIAGNOSIS — N183 Chronic kidney disease, stage 3 unspecified: Secondary | ICD-10-CM | POA: Diagnosis not present

## 2018-11-26 DIAGNOSIS — E785 Hyperlipidemia, unspecified: Secondary | ICD-10-CM | POA: Diagnosis not present

## 2018-11-26 DIAGNOSIS — R809 Proteinuria, unspecified: Secondary | ICD-10-CM | POA: Diagnosis not present

## 2018-12-02 DIAGNOSIS — E1122 Type 2 diabetes mellitus with diabetic chronic kidney disease: Secondary | ICD-10-CM | POA: Diagnosis not present

## 2018-12-02 DIAGNOSIS — R809 Proteinuria, unspecified: Secondary | ICD-10-CM | POA: Insufficient documentation

## 2018-12-02 DIAGNOSIS — N189 Chronic kidney disease, unspecified: Secondary | ICD-10-CM | POA: Diagnosis not present

## 2018-12-02 DIAGNOSIS — N184 Chronic kidney disease, stage 4 (severe): Secondary | ICD-10-CM | POA: Diagnosis not present

## 2018-12-02 DIAGNOSIS — I10 Essential (primary) hypertension: Secondary | ICD-10-CM | POA: Diagnosis not present

## 2018-12-07 ENCOUNTER — Other Ambulatory Visit: Payer: Self-pay | Admitting: Family Medicine

## 2018-12-07 MED ORDER — GLIMEPIRIDE 4 MG PO TABS: 4.0000 mg | ORAL_TABLET | Freq: Every day | ORAL | 3 refills | Status: DC

## 2018-12-07 NOTE — Telephone Encounter (Signed)
[facility] faxed refill request for the following medications:  glimepiride (AMARYL) 4 MG tablet  90 day supply Last Rx: 12/09/2017 LOV: 10/21/2018 NOV: 02/22/2019 Please advise. Thanks TNP

## 2019-01-15 ENCOUNTER — Telehealth: Payer: Self-pay

## 2019-01-15 NOTE — Telephone Encounter (Signed)
Our office received a fax requesting the patient have an appointment with Dr. [name] to sign off on orthopedic equipment for a hinged knee brace. After speaking with the patient to schedule the appointment he declined needing the equipment and did not want the appointment.

## 2019-01-18 DIAGNOSIS — I2510 Atherosclerotic heart disease of native coronary artery without angina pectoris: Secondary | ICD-10-CM | POA: Diagnosis not present

## 2019-01-18 DIAGNOSIS — R0602 Shortness of breath: Secondary | ICD-10-CM | POA: Diagnosis not present

## 2019-01-18 DIAGNOSIS — G4730 Sleep apnea, unspecified: Secondary | ICD-10-CM | POA: Diagnosis not present

## 2019-01-18 DIAGNOSIS — I340 Nonrheumatic mitral (valve) insufficiency: Secondary | ICD-10-CM | POA: Diagnosis not present

## 2019-01-18 DIAGNOSIS — E782 Mixed hyperlipidemia: Secondary | ICD-10-CM | POA: Diagnosis not present

## 2019-01-18 DIAGNOSIS — I10 Essential (primary) hypertension: Secondary | ICD-10-CM | POA: Diagnosis not present

## 2019-02-02 IMAGING — CR DG CHEST 2V
2 series · 2 of 2 positions shown · non-contrast
Comparison: 06/12/2006

CLINICAL DATA: Shortness of breath for a few days, worsening

EXAM:
CHEST  2 VIEW

[chest pa]
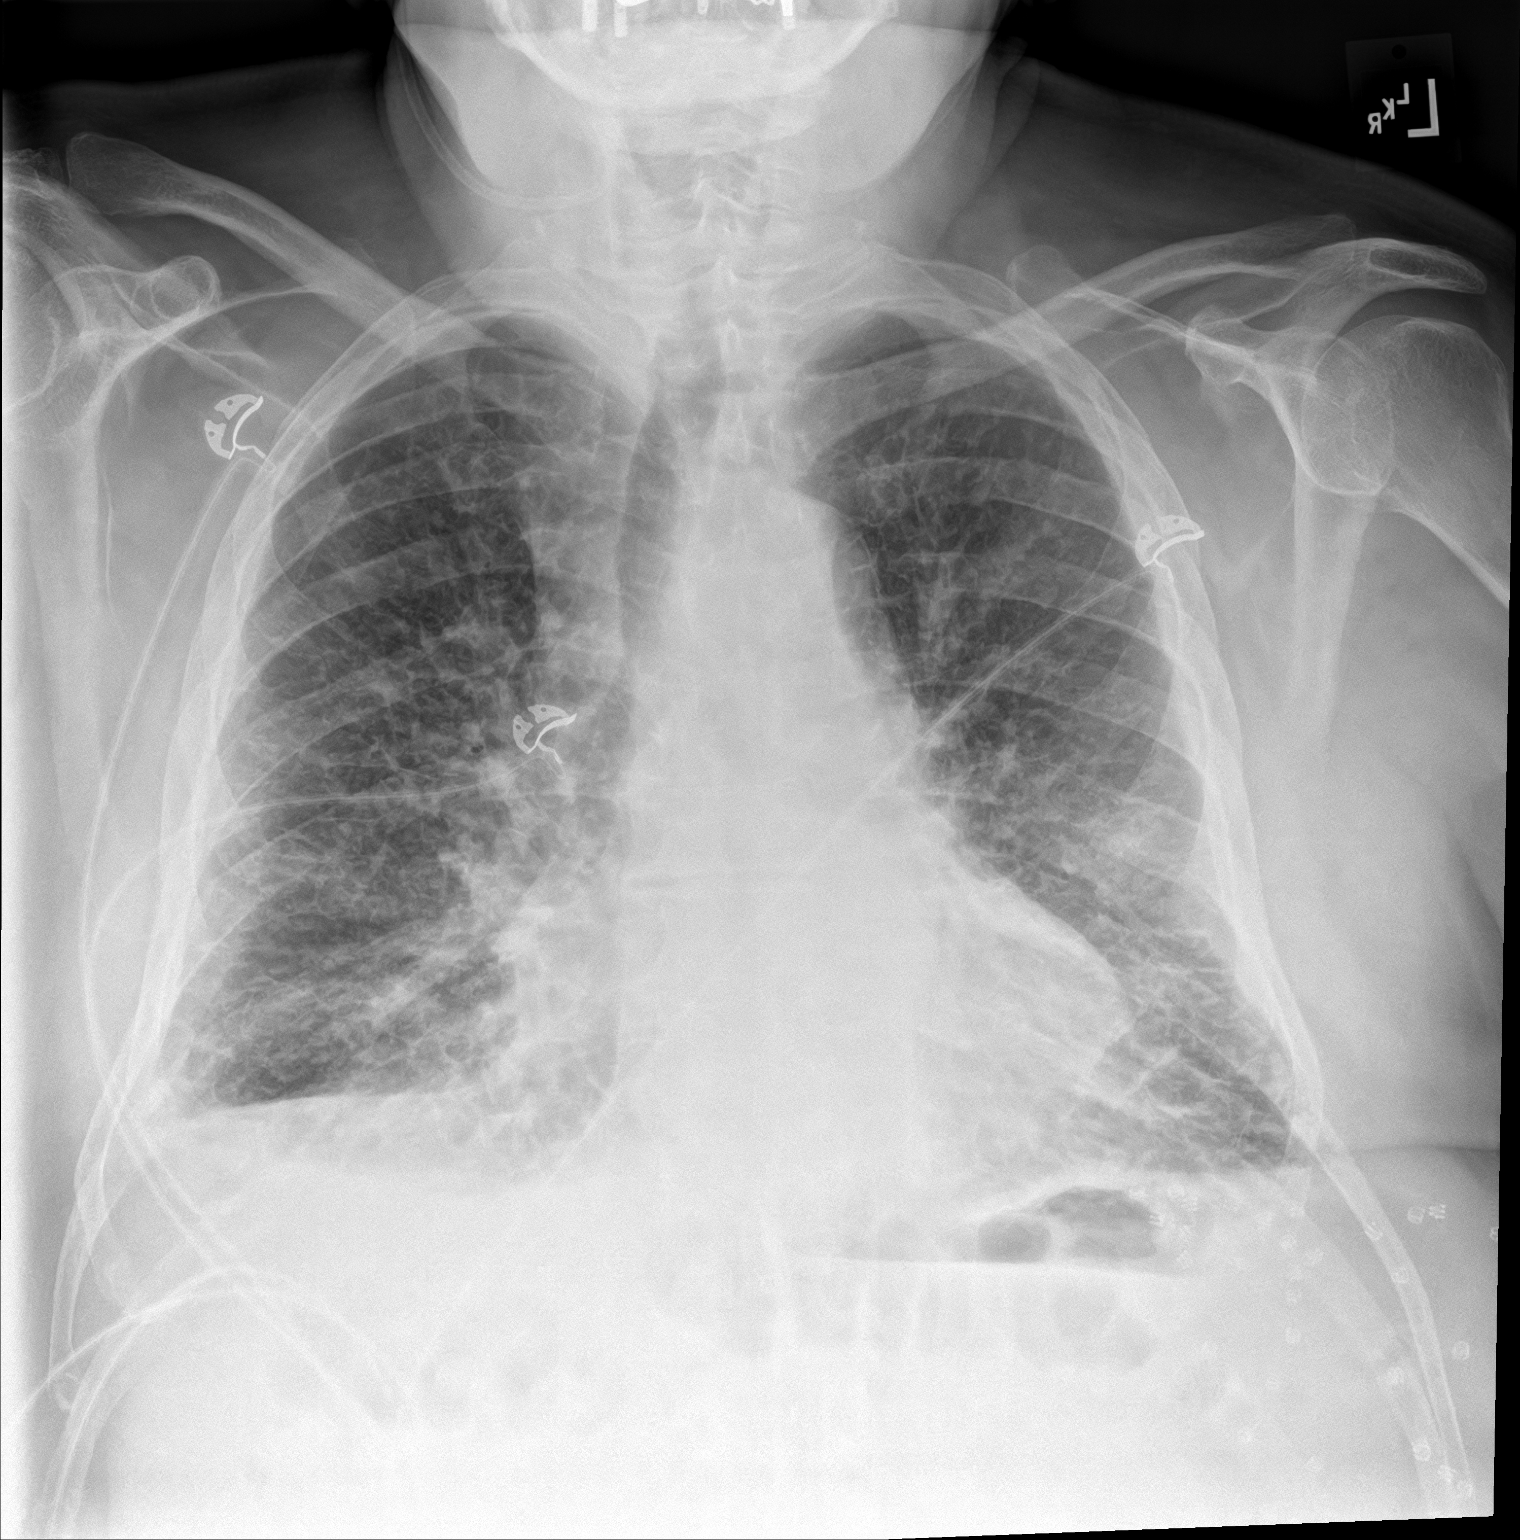

[chest lat]
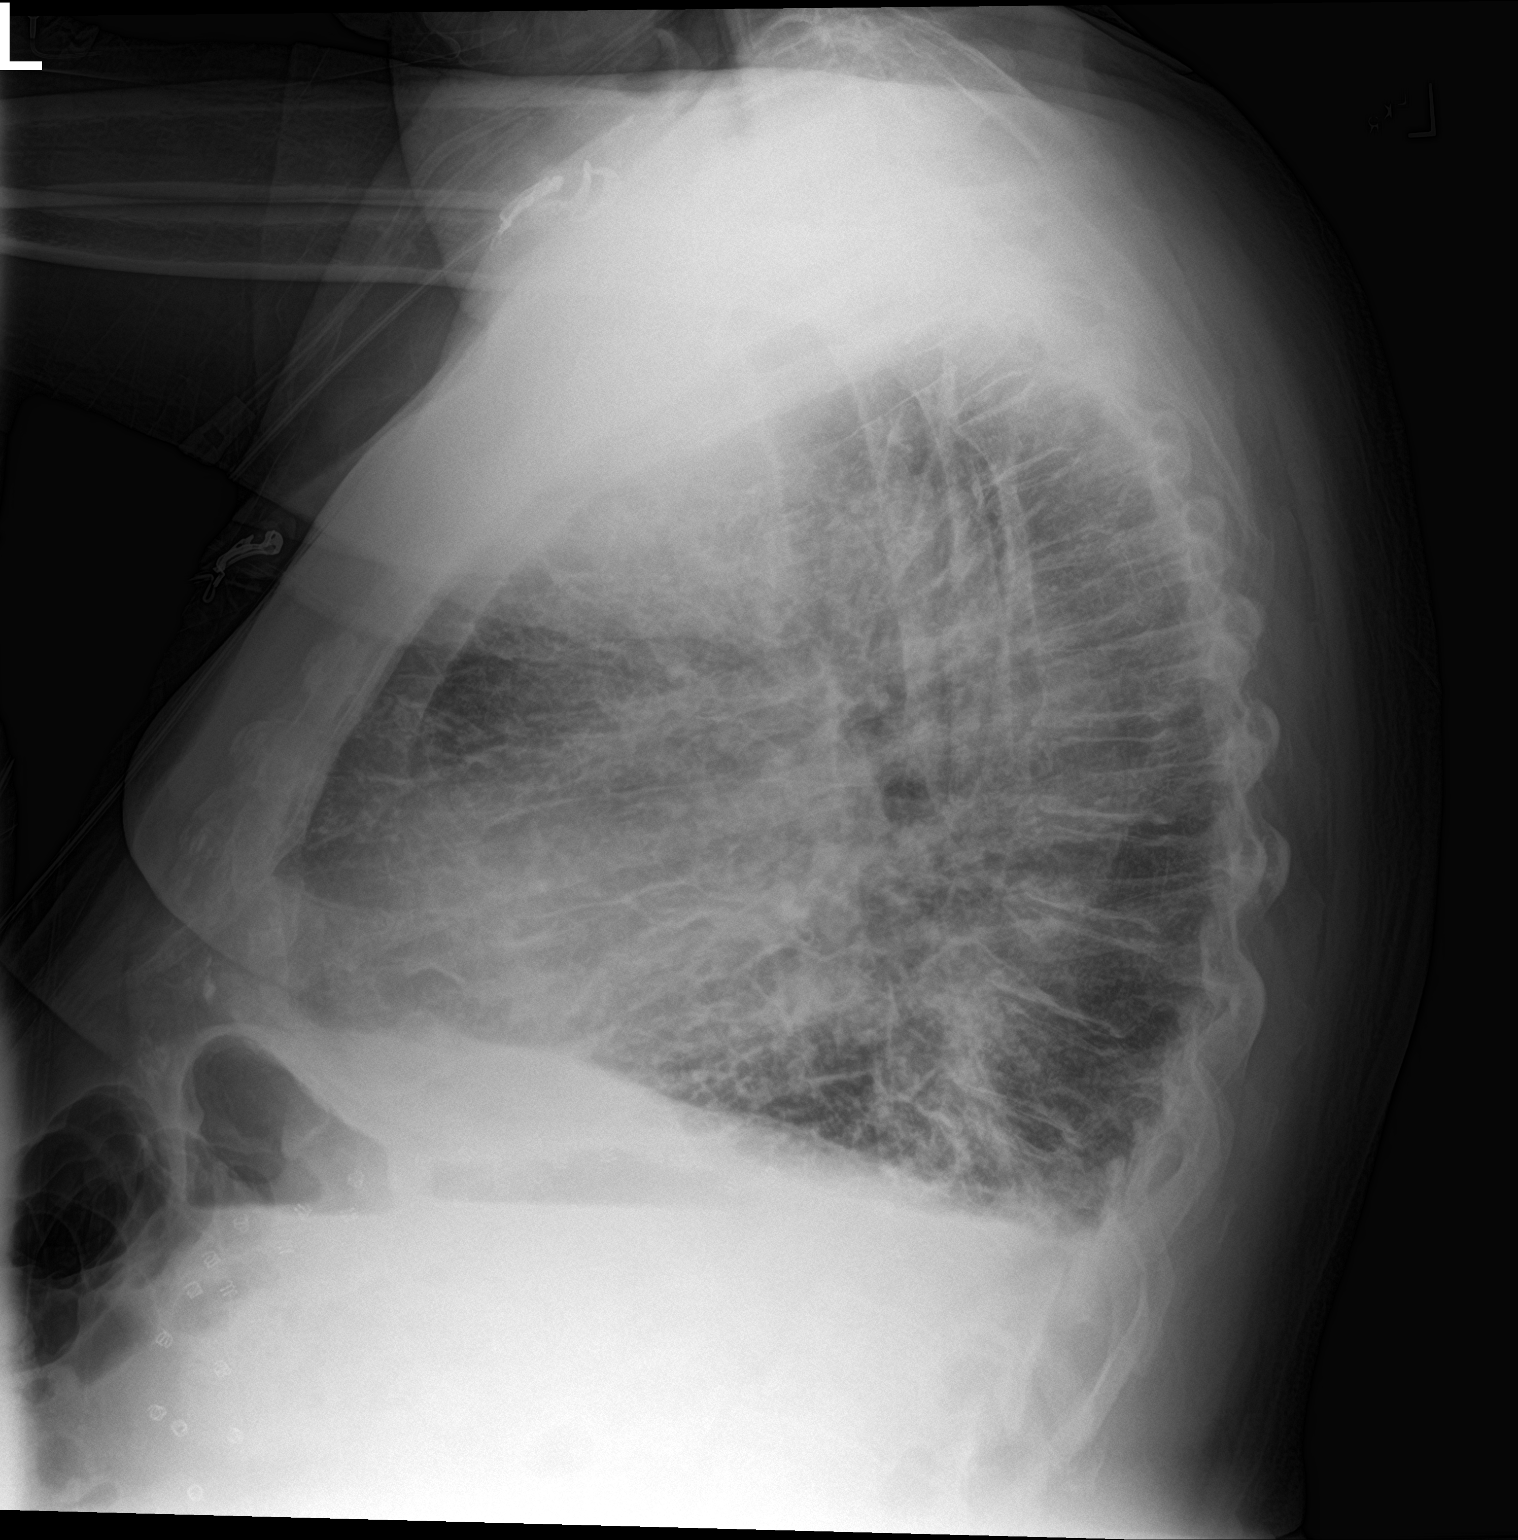

[2 of 2 positions shown; findings below may reference images not displayed]

FINDINGS: Chronic mild cardiomegaly. There is diffuse interstitial opacity
with Kerley lines and trace effusions. Large lung volumes with flat
diaphragm. No history of infectious symptoms.

No acute osseous finding. Hernia repair over the left upper
quadrant.
IMPRESSION: CHF pattern.

## 2019-02-05 ENCOUNTER — Other Ambulatory Visit: Payer: Self-pay | Admitting: Family Medicine

## 2019-02-18 DIAGNOSIS — G4730 Sleep apnea, unspecified: Secondary | ICD-10-CM | POA: Diagnosis not present

## 2019-02-18 DIAGNOSIS — I10 Essential (primary) hypertension: Secondary | ICD-10-CM | POA: Diagnosis not present

## 2019-02-18 DIAGNOSIS — I509 Heart failure, unspecified: Secondary | ICD-10-CM | POA: Diagnosis not present

## 2019-02-18 DIAGNOSIS — R0602 Shortness of breath: Secondary | ICD-10-CM | POA: Diagnosis not present

## 2019-02-18 DIAGNOSIS — I2510 Atherosclerotic heart disease of native coronary artery without angina pectoris: Secondary | ICD-10-CM | POA: Diagnosis not present

## 2019-02-18 DIAGNOSIS — E782 Mixed hyperlipidemia: Secondary | ICD-10-CM | POA: Diagnosis not present

## 2019-02-19 NOTE — Progress Notes (Signed)
Patient: Randy Dalton Male    DOB: 1927-08-16   86 y.o.   MRN: [id number] Visit Date: 03/01/2019  Today's Provider: [name], MD   Chief Complaint  Patient presents with  . Follow-up  . Diabetes  . Hypertension   Subjective:     HPI  Patient doing well and feels well.  He remains fairly active. He does have chronic foot pain due to flat feet. He has a's first Covid vaccine tomorrow. For his sleep apnea he wears a CPAP every night. Type 2 diabetes mellitus with stage 3a chronic kidney disease, without long-term current use of insulin ([place]) From 10/21/2018-HgB A1C--8.5.  Patient states his home blood sugars are up and down.   Essential (primary) hypertension From 10/21/2018-on losartan and carvedilol.   Patient states his home blood pressure is up and down.   Obstructive sleep apnea syndrome From 10/21/2018-On CPAP.  Morbid obesity ([place]) From 10/21/2018-With DM/HTN.  Allergies  Allergen Reactions  . Aspirin Other (See Comments)    Feel bad Feel bad  . Codeine Nausea Only     Current Outpatient Medications:  .  amLODipine (NORVASC) 10 MG tablet, TAKE 1 TABLET(10 MG) BY MOUTH DAILY, Disp: 90 tablet, Rfl: 3 .  carvedilol (COREG) 12.5 MG tablet, TAKE 1 TABLET BY MOUTH TWICE DAILY, Disp: 180 tablet, Rfl: 3 .  Cholecalciferol (VITAMIN D) 2000 UNITS tablet, Take 2,000 Units by mouth daily. , Disp: , Rfl:  .  glimepiride (AMARYL) 4 MG tablet, Take 1 tablet (4 mg total) by mouth daily., Disp: 90 tablet, Rfl: 3 .  hydrALAZINE (APRESOLINE) 50 MG tablet, TAKE 1 TABLET BY MOUTH TWICE DAILY, Disp: 60 tablet, Rfl: 2 .  levothyroxine (SYNTHROID) 175 MCG tablet, TAKE 1 TABLET(175 MCG) BY MOUTH DAILY, Disp: 90 tablet, Rfl: 1 .  linagliptin (TRADJENTA) 5 MG TABS tablet, Take 1 tablet (5 mg total) by mouth daily., Disp: 30 tablet, Rfl: 11 .  losartan (COZAAR) 50 MG tablet, Take 1 tablet (50 mg total) by mouth daily., Disp: 30 tablet, Rfl: 0 .  meloxicam (MOBIC)  7.5 MG tablet, Take 1 tablet (7.5 mg total) by mouth 2 (two) times daily., Disp: 28 tablet, Rfl: 0 .  Multiple Vitamin (MULTIVITAMIN) tablet, Take 1 tablet by mouth daily., Disp: , Rfl:  .  potassium chloride SA (K-DUR,KLOR-CON) 20 MEQ tablet, Take 1 tablet by mouth every other day. , Disp: , Rfl: 1 .  sildenafil (VIAGRA) 100 MG tablet, Take 100 mg by mouth as needed. , Disp: , Rfl:  .  simvastatin (ZOCOR) 20 MG tablet, TAKE ONE TABLET BY MOUTH AT BEDTIME., Disp: 90 tablet, Rfl: 3 .  torsemide (DEMADEX) 20 MG tablet, Take 20 mg by mouth daily., Disp: , Rfl: 1  Review of Systems  Constitutional: Negative for appetite change, chills and fever.  Respiratory: Negative for chest tightness, shortness of breath and wheezing.   Cardiovascular: Negative for chest pain and palpitations.  Gastrointestinal: Negative for abdominal pain, nausea and vomiting.  Endocrine: Negative.   Musculoskeletal: Positive for arthralgias.  Allergic/Immunologic: Negative.   Neurological: Negative.   Hematological: Negative.   Psychiatric/Behavioral: Negative.     Social History   Tobacco Use  . Smoking status: Never Smoker  . Smokeless tobacco: Never Used  Substance Use Topics  . Alcohol use: No    Alcohol/week: 0.0 standard drinks      Objective:   BP 120/67 (BP Location: Left Arm, Patient Position: Sitting, Cuff Size: Large)  Pulse 68   Temp (!) 97.1 F (36.2 C) (Other (Comment))   Resp 18   Ht 5\' 8"  (1.727 m)   Wt 212 lb (96.2 kg)   SpO2 96%   BMI 32.23 kg/m  Vitals:   02/22/19 0931  BP: 120/67  Pulse: 68  Resp: 18  Temp: (!) 97.1 F (36.2 C)  TempSrc: Other (Comment)  SpO2: 96%  Weight: 212 lb (96.2 kg)  Height: 5\' 8"  (1.727 m)  Body mass index is 32.23 kg/m.   Physical Exam Vitals reviewed.  Constitutional:      Appearance: He is well-developed.  HENT:     Head: Normocephalic and atraumatic.     Right Ear: External ear normal.     Left Ear: External ear normal.     Nose: Nose  normal.  Eyes:     General: No scleral icterus.    Conjunctiva/sclera: Conjunctivae normal.  Neck:     Thyroid: No thyromegaly.  Cardiovascular:     Rate and Rhythm: Normal rate and regular rhythm.     Heart sounds: Normal heart sounds.  Pulmonary:     Effort: Pulmonary effort is normal.     Breath sounds: Normal breath sounds.  Abdominal:     Palpations: Abdomen is soft.  Musculoskeletal:     Comments: Trace edema.  Skin:    General: Skin is warm and dry.  Neurological:     General: No focal deficit present.     Mental Status: He is alert and oriented to person, place, and time.     Comments: Monofilament foot exam normal.  Psychiatric:        Behavior: Behavior normal.        Thought Content: Thought content normal.        Judgment: Judgment normal.      Results for orders placed or performed in visit on 02/22/19  TSH  Result Value Ref Range   TSH 2.520 0.450 - 4.500 uIU/mL  Lipid panel  Result Value Ref Range   Cholesterol, Total 141 100 - 199 mg/dL   Triglycerides 126 0 - 149 mg/dL   HDL 37 (L) >39 mg/dL   VLDL Cholesterol Cal 23 5 - 40 mg/dL   LDL Chol Calc (NIH) 81 0 - 99 mg/dL   Chol/HDL Ratio 3.8 0.0 - 5.0 ratio  Comprehensive Metabolic Panel (CMET)  Result Value Ref Range   Glucose 250 (H) 65 - 99 mg/dL   BUN 51 (H) 8 - 27 mg/dL   Creatinine, Ser 2.28 (H) 0.76 - 1.27 mg/dL   GFR calc non Af Amer 25 (L) >59 mL/min/1.73   GFR calc Af Amer 28 (L) >59 mL/min/1.73   BUN/Creatinine Ratio 22 10 - 24   Sodium 138 134 - 144 mmol/L   Potassium 4.4 3.5 - 5.2 mmol/L   Chloride 100 96 - 106 mmol/L   CO2 24 20 - 29 mmol/L   Calcium 9.9 8.6 - 10.2 mg/dL   Total Protein 6.9 6.0 - 8.5 g/dL   Albumin 4.3 3.6 - 4.6 g/dL   Globulin, Total 2.6 1.5 - 4.5 g/dL   Albumin/Globulin Ratio 1.7 1.2 - 2.2   Bilirubin Total 0.6 0.0 - 1.2 mg/dL   Alkaline Phosphatase 91 39 - 117 IU/L   AST 11 0 - 40 IU/L   ALT 9 0 - 44 IU/L  CBC w/Diff/Platelet  Result Value Ref Range   WBC  13.0 (H) 3.4 - 10.8 x10E3/uL   RBC 4.53 4.14 - 5.80  x10E6/uL   Hemoglobin 14.5 13.0 - 17.7 g/dL   Hematocrit 42.6 37.5 - 51.0 %   MCV 94 79 - 97 fL   MCH 32.0 26.6 - 33.0 pg   MCHC 34.0 31.5 - 35.7 g/dL   RDW 12.9 11.6 - 15.4 %   Platelets 149 (L) 150 - 450 x10E3/uL   Neutrophils 76 Not Estab. %   Lymphs 12 Not Estab. %   Monocytes 8 Not Estab. %   Eos 3 Not Estab. %   Basos 1 Not Estab. %   Neutrophils Absolute 10.0 (H) 1.4 - 7.0 x10E3/uL   Lymphocytes Absolute 1.6 0.7 - 3.1 x10E3/uL   Monocytes Absolute 1.0 (H) 0.1 - 0.9 x10E3/uL   EOS (ABSOLUTE) 0.3 0.0 - 0.4 x10E3/uL   Basophils Absolute 0.1 0.0 - 0.2 x10E3/uL   Immature Granulocytes 0 Not Estab. %   Immature Grans (Abs) 0.0 0.0 - 0.1 x10E3/uL  POCT glycosylated hemoglobin (Hb A1C)  Result Value Ref Range   Hemoglobin A1C 8.3 (A) 4.0 - 5.6 %   Est. average glucose Bld gHb Est-mCnc 192        Assessment & Plan    1. Type 2 diabetes mellitus with stage 3a chronic kidney disease, without long-term current use of insulin (HCC) Clinically stable in this 86 year old.  Follow-up 4 months. - POCT glycosylated hemoglobin (Hb A1C) 8.3 today .  Patient is on simvastatin 20 - TSH - Lipid panel - Comprehensive Metabolic Panel (CMET) - CBC w/Diff/Platelet  2. Essential (primary) hypertension Controlled on losartan, carvedilol, hydralazine - TSH - Lipid panel - Comprehensive Metabolic Panel (CMET) - CBC w/Diff/Platelet  3. Mixed hyperlipidemia Zocor 20 mg daily - TSH - Lipid panel - Comprehensive Metabolic Panel (CMET) - CBC w/Diff/Platelet  4. Adult hypothyroidism  - TSH - Lipid panel - Comprehensive Metabolic Panel (CMET) - CBC w/Diff/Platelet  5. Class 1 obesity due to excess calories with serious comorbidity and body mass index (BMI) of 32.0 to 32.9 in adult  - TSH - Lipid panel - Comprehensive Metabolic Panel (CMET) - CBC w/Diff/Platelet 6.Bilateral pes planus Patient takes 7.5 mg meloxicam as needed.  I  told him to limit this to his kidney disease 7.  Obstructive sleep apnea Patient wears a CPAP nightly Follow up in 4 months.     I,Luvena Wentling,acting as a scribe for [name], MD.,have documented all relevant documentation on the behalf of [name], MD,as directed by  [name], MD while in the presence of [name], MD.      [name], MD  [facility]

## 2019-02-22 ENCOUNTER — Encounter: Payer: Self-pay | Admitting: Family Medicine

## 2019-02-22 ENCOUNTER — Other Ambulatory Visit: Payer: Self-pay

## 2019-02-22 ENCOUNTER — Ambulatory Visit (INDEPENDENT_AMBULATORY_CARE_PROVIDER_SITE_OTHER): Payer: Medicare Other | Admitting: Family Medicine

## 2019-02-22 VITALS — BP 120/67 | HR 68 | Temp 97.1°F | Resp 18 | Ht 68.0 in | Wt 212.0 lb

## 2019-02-22 DIAGNOSIS — E039 Hypothyroidism, unspecified: Secondary | ICD-10-CM | POA: Diagnosis not present

## 2019-02-22 DIAGNOSIS — Z6832 Body mass index (BMI) 32.0-32.9, adult: Secondary | ICD-10-CM

## 2019-02-22 DIAGNOSIS — M2141 Flat foot [pes planus] (acquired), right foot: Secondary | ICD-10-CM

## 2019-02-22 DIAGNOSIS — E6609 Other obesity due to excess calories: Secondary | ICD-10-CM

## 2019-02-22 DIAGNOSIS — E782 Mixed hyperlipidemia: Secondary | ICD-10-CM

## 2019-02-22 DIAGNOSIS — E1121 Type 2 diabetes mellitus with diabetic nephropathy: Secondary | ICD-10-CM | POA: Diagnosis not present

## 2019-02-22 DIAGNOSIS — E1122 Type 2 diabetes mellitus with diabetic chronic kidney disease: Secondary | ICD-10-CM

## 2019-02-22 DIAGNOSIS — I10 Essential (primary) hypertension: Secondary | ICD-10-CM | POA: Diagnosis not present

## 2019-02-22 DIAGNOSIS — M2142 Flat foot [pes planus] (acquired), left foot: Secondary | ICD-10-CM

## 2019-02-22 DIAGNOSIS — N1831 Chronic kidney disease, stage 3a: Secondary | ICD-10-CM

## 2019-02-23 LAB — COMPREHENSIVE METABOLIC PANEL
ALT: 9 IU/L (ref 0–44)
AST: 11 IU/L (ref 0–40)
Albumin/Globulin Ratio: 1.7 (ref 1.2–2.2)
Albumin: 4.3 g/dL (ref 3.6–4.6)
Alkaline Phosphatase: 91 IU/L (ref 39–117)
BUN/Creatinine Ratio: 22 (ref 10–24)
BUN: 51 mg/dL — ABNORMAL HIGH (ref 8–27)
Bilirubin Total: 0.6 mg/dL (ref 0.0–1.2)
CO2: 24 mmol/L (ref 20–29)
Calcium: 9.9 mg/dL (ref 8.6–10.2)
Chloride: 100 mmol/L (ref 96–106)
Creatinine, Ser: 2.28 mg/dL — ABNORMAL HIGH (ref 0.76–1.27)
GFR calc Af Amer: 28 mL/min/{1.73_m2} — ABNORMAL LOW (ref >59)
GFR calc non Af Amer: 25 mL/min/{1.73_m2} — ABNORMAL LOW (ref >59)
Globulin, Total: 2.6 g/dL (ref 1.5–4.5)
Glucose: 250 mg/dL — ABNORMAL HIGH (ref 65–99)
Potassium: 4.4 mmol/L (ref 3.5–5.2)
Sodium: 138 mmol/L (ref 134–144)
Total Protein: 6.9 g/dL (ref 6.0–8.5)

## 2019-02-23 LAB — CBC WITH DIFFERENTIAL/PLATELET
Basophils Absolute: 0.1 10*3/uL (ref 0.0–0.2)
Basos: 1 %
EOS (ABSOLUTE): 0.3 10*3/uL (ref 0.0–0.4)
Eos: 3 %
Hematocrit: 42.6 % (ref 37.5–51.0)
Hemoglobin: 14.5 g/dL (ref 13.0–17.7)
Immature Grans (Abs): 0 10*3/uL (ref 0.0–0.1)
Immature Granulocytes: 0 %
Lymphocytes Absolute: 1.6 10*3/uL (ref 0.7–3.1)
Lymphs: 12 %
MCH: 32 pg (ref 26.6–33.0)
MCHC: 34 g/dL (ref 31.5–35.7)
MCV: 94 fL (ref 79–97)
Monocytes Absolute: 1 10*3/uL — ABNORMAL HIGH (ref 0.1–0.9)
Monocytes: 8 %
Neutrophils Absolute: 10 10*3/uL — ABNORMAL HIGH (ref 1.4–7.0)
Neutrophils: 76 %
Platelets: 149 10*3/uL — ABNORMAL LOW (ref 150–450)
RBC: 4.53 x10E6/uL (ref 4.14–5.80)
RDW: 12.9 % (ref 11.6–15.4)
WBC: 13 10*3/uL — ABNORMAL HIGH (ref 3.4–10.8)

## 2019-02-23 LAB — LIPID PANEL
Chol/HDL Ratio: 3.8 ratio (ref 0.0–5.0)
Cholesterol, Total: 141 mg/dL (ref 100–199)
HDL: 37 mg/dL — ABNORMAL LOW (ref >39)
LDL Chol Calc (NIH): 81 mg/dL (ref 0–99)
Triglycerides: 126 mg/dL (ref 0–149)
VLDL Cholesterol Cal: 23 mg/dL (ref 5–40)

## 2019-02-23 LAB — TSH: TSH: 2.52 u[IU]/mL (ref 0.450–4.500)

## 2019-03-01 LAB — POCT GLYCOSYLATED HEMOGLOBIN (HGB A1C)
Est. average glucose Bld gHb Est-mCnc: 192
Hemoglobin A1C: 8.3 % — AB (ref 4.0–5.6)

## 2019-03-03 DIAGNOSIS — N189 Chronic kidney disease, unspecified: Secondary | ICD-10-CM | POA: Diagnosis not present

## 2019-03-03 DIAGNOSIS — R8290 Unspecified abnormal findings in urine: Secondary | ICD-10-CM | POA: Diagnosis not present

## 2019-03-03 DIAGNOSIS — E1122 Type 2 diabetes mellitus with diabetic chronic kidney disease: Secondary | ICD-10-CM | POA: Diagnosis not present

## 2019-03-05 DIAGNOSIS — Z23 Encounter for immunization: Secondary | ICD-10-CM | POA: Diagnosis not present

## 2019-03-10 DIAGNOSIS — E1122 Type 2 diabetes mellitus with diabetic chronic kidney disease: Secondary | ICD-10-CM | POA: Diagnosis not present

## 2019-03-10 DIAGNOSIS — I10 Essential (primary) hypertension: Secondary | ICD-10-CM | POA: Diagnosis not present

## 2019-03-10 DIAGNOSIS — N184 Chronic kidney disease, stage 4 (severe): Secondary | ICD-10-CM | POA: Diagnosis not present

## 2019-03-10 DIAGNOSIS — N189 Chronic kidney disease, unspecified: Secondary | ICD-10-CM | POA: Diagnosis not present

## 2019-03-10 DIAGNOSIS — R809 Proteinuria, unspecified: Secondary | ICD-10-CM | POA: Diagnosis not present

## 2019-04-02 ENCOUNTER — Other Ambulatory Visit: Payer: Self-pay | Admitting: Family Medicine

## 2019-04-02 MED ORDER — LEVOTHYROXINE SODIUM 175 MCG PO TABS: ORAL_TABLET | ORAL | 1 refills | Status: DC

## 2019-04-02 NOTE — Telephone Encounter (Signed)
Medication: levothyroxine (SYNTHROID) 175 MCG tablet [[id number]]   Has the patient contacted their pharmacy? Yes  (Agent: If no, request that the patient contact the pharmacy for the refill.) (Agent: If yes, when and what did the pharmacy advise?)  Preferred Pharmacy (with phone number or street name): [id number] - [place], [address]  Phone:  [phone] Fax:  [phone]     Agent: Please be advised that RX refills may take up to 3 business days. We ask that you follow-up with your pharmacy.

## 2019-04-09 DIAGNOSIS — Z23 Encounter for immunization: Secondary | ICD-10-CM | POA: Diagnosis not present

## 2019-05-26 ENCOUNTER — Telehealth: Payer: Self-pay | Admitting: Family Medicine

## 2019-05-26 NOTE — Telephone Encounter (Signed)
Representative from [place] stating she sent a request via fax for a c-pap on 05/17/2019 and hadn't heard anything, rep will re-fax request today.please advise

## 2019-05-26 NOTE — Telephone Encounter (Signed)
Faxed form.

## 2019-06-17 DIAGNOSIS — E782 Mixed hyperlipidemia: Secondary | ICD-10-CM | POA: Diagnosis not present

## 2019-06-17 DIAGNOSIS — G4730 Sleep apnea, unspecified: Secondary | ICD-10-CM | POA: Diagnosis not present

## 2019-06-17 DIAGNOSIS — I10 Essential (primary) hypertension: Secondary | ICD-10-CM | POA: Diagnosis not present

## 2019-06-17 DIAGNOSIS — I2510 Atherosclerotic heart disease of native coronary artery without angina pectoris: Secondary | ICD-10-CM | POA: Diagnosis not present

## 2019-06-17 DIAGNOSIS — R0602 Shortness of breath: Secondary | ICD-10-CM | POA: Diagnosis not present

## 2019-06-22 ENCOUNTER — Ambulatory Visit: Payer: Self-pay | Admitting: Family Medicine

## 2019-06-22 DIAGNOSIS — R0602 Shortness of breath: Secondary | ICD-10-CM | POA: Diagnosis not present

## 2019-06-22 NOTE — Progress Notes (Signed)
Established patient visit  I,[name],acting as a scribe for [name], MD.,have documented all relevant documentation on the behalf of [name], MD,as directed by  [name], MD while in the presence of [name], MD.   Patient: Randy Dalton   DOB: [birth year]   86 y.o. Male  MRN: [id number] Visit Date: 06/24/2019  Today's healthcare provider: [name], MD   Chief Complaint  Patient presents with  . Follow-up  . Diabetes  . Hypertension  . Hyperlipidemia   Subjective    HPI  Overall patient feels well and has no complaints.  He is in for recheck of his chronic medical problems. Patient has AWV with [facility] today at 9:00 am.  Diabetes Mellitus Type II, follow-up  Lab Results  Component Value Date   HGBA1C 8.3 (A) 03/01/2019   HGBA1C 8.5 (A) 10/21/2018   HGBA1C 8.5 (A) 06/22/2018   Last seen for diabetes 4 months ago.  Management since then includes continuing the same treatment. He reports good compliance with treatment. He is not having side effects. none  Home blood sugar records: fasting range: is checking  Episodes of hypoglycemia? No none   Current insulin regiment: n/a Most Recent Eye Exam: 05/07/2017  --------------------------------------------------------------------  Hypertension, follow-up  BP Readings from Last 3 Encounters:  06/24/19 (!) 169/73  02/22/19 120/67  10/21/18 136/74   Wt Readings from Last 3 Encounters:  06/24/19 209 lb (94.8 kg)  02/22/19 212 lb (96.2 kg)  10/21/18 211 lb 9.6 oz (96 kg)     He was last seen for hypertension 4 months ago.  BP at that visit was 120/67. Management since that visit includes; Controlled on losartan, carvedilol, hydralazine.  He reports good compliance with treatment. He is not having side effects. none He is exercising. He is adherent to low salt diet.   Outside blood pressures are none.  He does not smoke.  Use of agents associated with  hypertension: none.   --------------------------------------------------------------------  Lipid/Cholesterol, follow-up  Last Lipid Panel: Lab Results  Component Value Date   CHOL 141 02/22/2019   LDLCALC 81 02/22/2019   HDL 37 (L) 02/22/2019   TRIG 126 02/22/2019    He was last seen for this 4 months ago.  Management since that visit includes; Zocor 20 mg daily. He reports good compliance with treatment. He is not having side effects. none He is following a Regular diet. Current exercise: walking  Last metabolic panel Lab Results  Component Value Date   GLUCOSE 250 (H) 02/22/2019   NA 138 02/22/2019   K 4.4 02/22/2019   BUN 51 (H) 02/22/2019   CREATININE 2.28 (H) 02/22/2019   GFRNONAA 25 (L) 02/22/2019   GFRAA 28 (L) 02/22/2019   CALCIUM 9.9 02/22/2019   AST 11 02/22/2019   ALT 9 02/22/2019   The ASCVD Risk score Mikey Bussing DC Jr., et al., 2013) failed to calculate for the following reasons:   The 2013 ASCVD risk score is only valid for ages 62 to 21  --------------------------------------------------------------------  Hypothyroid, follow-up  Lab Results  Component Value Date   TSH 2.520 02/22/2019   TSH 0.455 02/18/2018   TSH 0.603 03/11/2017   Wt Readings from Last 3 Encounters:  06/24/19 209 lb (94.8 kg)  02/22/19 212 lb (96.2 kg)  10/21/18 211 lb 9.6 oz (96 kg)    He was last seen for hypothyroid 4 months ago.  Management since that visit includes; on levothyroxine 175 mcg.  He reports good compliance with treatment. He is not having side effects. none  --------------------------------------------------------------------      Medications: Outpatient Medications Prior to Visit  Medication Sig  . amLODipine (NORVASC) 10 MG tablet TAKE 1 TABLET(10 MG) BY MOUTH DAILY  . carvedilol (COREG) 12.5 MG tablet TAKE 1 TABLET BY MOUTH TWICE DAILY  . Cholecalciferol (VITAMIN D) 2000 UNITS tablet Take 2,000 Units by mouth daily.   Marland Kitchen glimepiride (AMARYL) 4 MG  tablet Take 1 tablet (4 mg total) by mouth daily.  . hydrALAZINE (APRESOLINE) 50 MG tablet TAKE 1 TABLET BY MOUTH TWICE DAILY  . levothyroxine (SYNTHROID) 175 MCG tablet TAKE 1 TABLET(175 MCG) BY MOUTH DAILY  . linagliptin (TRADJENTA) 5 MG TABS tablet Take 1 tablet (5 mg total) by mouth daily.  Marland Kitchen losartan (COZAAR) 50 MG tablet Take 1 tablet (50 mg total) by mouth daily.  . simvastatin (ZOCOR) 20 MG tablet TAKE ONE TABLET BY MOUTH AT BEDTIME.  Marland Kitchen torsemide (DEMADEX) 20 MG tablet Take 20 mg by mouth daily.  . meloxicam (MOBIC) 7.5 MG tablet Take 1 tablet (7.5 mg total) by mouth 2 (two) times daily.  . Multiple Vitamin (MULTIVITAMIN) tablet Take 1 tablet by mouth daily.  . potassium chloride SA (K-DUR,KLOR-CON) 20 MEQ tablet Take 1 tablet by mouth every other day.   . sildenafil (VIAGRA) 100 MG tablet Take 100 mg by mouth as needed.    No facility-administered medications prior to visit.    Review of Systems  Constitutional: Negative.   HENT: Negative.   Eyes: Negative.   Respiratory: Negative for cough and shortness of breath.   Cardiovascular: Negative for chest pain, palpitations and leg swelling.  Gastrointestinal: Negative.   Endocrine: Negative.   Genitourinary: Negative.   Musculoskeletal: Negative for arthralgias, myalgias, neck pain and neck stiffness.  Allergic/Immunologic: Negative.   Neurological: Negative.   Hematological: Negative.   Psychiatric/Behavioral: Negative.  Negative for agitation, self-injury, sleep disturbance and suicidal ideas.      Objective    BP (!) 169/73 (BP Location: Right Arm, Cuff Size: Large)   Pulse (!) 51   Temp 99 F (37.2 C) (Other (Comment))   Resp 18   Ht 5\' 8"  (1.727 m)   Wt 209 lb (94.8 kg)   SpO2 99%   BMI 31.78 kg/m    Physical Exam Vitals and nursing note reviewed.  Constitutional:      Appearance: Normal appearance. He is normal weight.  HENT:     Right Ear: Tympanic membrane normal.     Left Ear: Tympanic membrane  normal.     Nose: Nose normal.     Mouth/Throat:     Mouth: Mucous membranes are moist.  Cardiovascular:     Rate and Rhythm: Normal rate and regular rhythm.     Pulses: Normal pulses.     Heart sounds: Normal heart sounds.  Pulmonary:     Effort: Pulmonary effort is normal.     Breath sounds: Normal breath sounds.  Abdominal:     General: Bowel sounds are normal.     Palpations: Abdomen is soft.  Musculoskeletal:        General: Normal range of motion.     Cervical back: Normal range of motion and neck supple.  Skin:    General: Skin is warm.  Neurological:     General: No focal deficit present.     Mental Status: He is alert and oriented to person, place, and time.  Psychiatric:  Mood and Affect: Mood normal.        Behavior: Behavior normal.        Thought Content: Thought content normal.        Judgment: Judgment normal.       No results found for any visits on 06/24/19.  Assessment & Plan     1. Type 2 diabetes mellitus with stage 3a chronic kidney disease, without long-term current use of insulin (HCC) Goal A1c 53.94-97 at 86 years old. - TSH - Lipid panel - CBC w/Diff/Platelet - Comprehensive Metabolic Panel (CMET) - Hemoglobin A1C  2. Essential (primary) hypertension Increase losartan from 50 to 100 mg daily, return to clinic1- 2 months - TSH - Lipid panel - CBC w/Diff/Platelet - Comprehensive Metabolic Panel (CMET) - Hemoglobin A1C  3. Mixed hyperlipidemia On simvastatin. - TSH - Lipid panel - CBC w/Diff/Platelet - Comprehensive Metabolic Panel (CMET) - Hemoglobin A1C  4. Adult hypothyroidism  - TSH - Lipid panel - CBC w/Diff/Platelet - Comprehensive Metabolic Panel (CMET) - Hemoglobin A1C  5. Chronic diastolic heart failure (HCC) Continue present meds.  He is on losartan amlodipine - TSH - Lipid panel - CBC w/Diff/Platelet - Comprehensive Metabolic Panel (CMET) - Hemoglobin A1C   No follow-ups on file.      I, [name], MD, have reviewed all documentation for this visit. The documentation on 06/28/19 for the exam, diagnosis, procedures, and orders are all accurate and complete.    Mckensie Scotti Cranford Mon, MD  [facility] [phone] (phone) [phone] (fax)  [place]

## 2019-06-23 ENCOUNTER — Ambulatory Visit (INDEPENDENT_AMBULATORY_CARE_PROVIDER_SITE_OTHER): Payer: Medicare Other

## 2019-06-23 ENCOUNTER — Other Ambulatory Visit: Payer: Self-pay

## 2019-06-23 DIAGNOSIS — Z0000 Encounter for general adult medical examination without abnormal findings: Secondary | ICD-10-CM

## 2019-06-23 NOTE — Patient Instructions (Signed)
Randy Dalton , Thank you for taking time to come for your Medicare Wellness Visit. I appreciate your ongoing commitment to your health goals. Please review the following plan we discussed and let me know if I can assist you in the future.   Screening recommendations/referrals: Colonoscopy: No longer required.  Recommended yearly ophthalmology/optometry visit for glaucoma screening and checkup Recommended yearly dental visit for hygiene and checkup  Vaccinations: Influenza vaccine: Completed 10/14/202 Pneumococcal vaccine: Completed series Tdap vaccine: Completed 04/28/03, due currently. Shingles vaccine: Currently due, declined at this time.    Advanced directives: Advance directive discussed with you today. Even though you declined this today please call our office should you change your mind and we can give you the proper paperwork for you to fill out.  Conditions/risks identified: Recommend to increase water intake to 6-8 8 oz glasses a day. Pt advised to schedule his yearly eye exam to follow up on diabetes. Requested records once completed.  Next appointment: 06/24/19 @ 9:40 AM with Dr [name]   Preventive Care 84 Years and Older, Male Preventive care refers to lifestyle choices and visits with your health care provider that can promote health and wellness. What does preventive care include?  A yearly physical exam. This is also called an annual well check.  Dental exams once or twice a year.  Routine eye exams. Ask your health care provider how often you should have your eyes checked.  Personal lifestyle choices, including:  Daily care of your teeth and gums.  Regular physical activity.  Eating a healthy diet.  Avoiding tobacco and drug use.  Limiting alcohol use.  Practicing safe sex.  Taking low doses of aspirin every day.  Taking vitamin and mineral supplements as recommended by your health care provider. What happens during an annual well check? The services and  screenings done by your health care provider during your annual well check will depend on your age, overall health, lifestyle risk factors, and family history of disease. Counseling  Your health care provider may ask you questions about your:  Alcohol use.  Tobacco use.  Drug use.  Emotional well-being.  Home and relationship well-being.  Sexual activity.  Eating habits.  History of falls.  Memory and ability to understand (cognition).  Work and work Statistician. Screening  You may have the following tests or measurements:  Height, weight, and BMI.  Blood pressure.  Lipid and cholesterol levels. These may be checked every 5 years, or more frequently if you are over 84 years old.  Skin check.  Lung cancer screening. You may have this screening every year starting at age 84 if you have a 30-pack-year history of smoking and currently smoke or have quit within the past 15 years.  Fecal occult blood test (FOBT) of the stool. You may have this test every year starting at age 84.  Flexible sigmoidoscopy or colonoscopy. You may have a sigmoidoscopy every 5 years or a colonoscopy every 10 years starting at age 84.  Prostate cancer screening. Recommendations will vary depending on your family history and other risks.  Hepatitis C blood test.  Hepatitis B blood test.  Sexually transmitted disease (STD) testing.  Diabetes screening. This is done by checking your blood sugar (glucose) after you have not eaten for a while (fasting). You may have this done every 1-3 years.  Abdominal aortic aneurysm (AAA) screening. You may need this if you are a current or former smoker.  Osteoporosis. You may be screened starting at age 84 if  you are at high risk. Talk with your health care provider about your test results, treatment options, and if necessary, the need for more tests. Vaccines  Your health care provider may recommend certain vaccines, such as:  Influenza vaccine. This is  recommended every year.  Tetanus, diphtheria, and acellular pertussis (Tdap, Td) vaccine. You may need a Td booster every 10 years.  Zoster vaccine. You may need this after age 84.  Pneumococcal 13-valent conjugate (PCV13) vaccine. One dose is recommended after age 84.  Pneumococcal polysaccharide (PPSV23) vaccine. One dose is recommended after age 84. Talk to your health care provider about which screenings and vaccines you need and how often you need them. This information is not intended to replace advice given to you by your health care provider. Make sure you discuss any questions you have with your health care provider. Document Released: 01/20/2015 Document Revised: 09/13/2015 Document Reviewed: 10/25/2014 Elsevier Interactive Patient Education  2017 [place] Prevention in the Home Falls can cause injuries. They can happen to people of all ages. There are many things you can do to make your home safe and to help prevent falls. What can I do on the outside of my home?  Regularly fix the edges of walkways and driveways and fix any cracks.  Remove anything that might make you trip as you walk through a door, such as a raised step or threshold.  Trim any bushes or trees on the path to your home.  Use bright outdoor lighting.  Clear any walking paths of anything that might make someone trip, such as rocks or tools.  Regularly check to see if handrails are loose or broken. Make sure that both sides of any steps have handrails.  Any raised decks and porches should have guardrails on the edges.  Have any leaves, snow, or ice cleared regularly.  Use sand or salt on walking paths during winter.  Clean up any spills in your garage right away. This includes oil or grease spills. What can I do in the bathroom?  Use night lights.  Install grab bars by the toilet and in the tub and shower. Do not use towel bars as grab bars.  Use non-skid mats or decals in the tub or  shower.  If you need to sit down in the shower, use a plastic, non-slip stool.  Keep the floor dry. Clean up any water that spills on the floor as soon as it happens.  Remove soap buildup in the tub or shower regularly.  Attach bath mats securely with double-sided non-slip rug tape.  Do not have throw rugs and other things on the floor that can make you trip. What can I do in the bedroom?  Use night lights.  Make sure that you have a light by your bed that is easy to reach.  Do not use any sheets or blankets that are too big for your bed. They should not hang down onto the floor.  Have a firm chair that has side arms. You can use this for support while you get dressed.  Do not have throw rugs and other things on the floor that can make you trip. What can I do in the kitchen?  Clean up any spills right away.  Avoid walking on wet floors.  Keep items that you use a lot in easy-to-reach places.  If you need to reach something above you, use a strong step stool that has a grab bar.  Keep electrical cords  out of the way.  Do not use floor polish or wax that makes floors slippery. If you must use wax, use non-skid floor wax.  Do not have throw rugs and other things on the floor that can make you trip. What can I do with my stairs?  Do not leave any items on the stairs.  Make sure that there are handrails on both sides of the stairs and use them. Fix handrails that are broken or loose. Make sure that handrails are as long as the stairways.  Check any carpeting to make sure that it is firmly attached to the stairs. Fix any carpet that is loose or worn.  Avoid having throw rugs at the top or bottom of the stairs. If you do have throw rugs, attach them to the floor with carpet tape.  Make sure that you have a light switch at the top of the stairs and the bottom of the stairs. If you do not have them, ask someone to add them for you. What else can I do to help prevent  falls?  Wear shoes that:  Do not have high heels.  Have rubber bottoms.  Are comfortable and fit you well.  Are closed at the toe. Do not wear sandals.  If you use a stepladder:  Make sure that it is fully opened. Do not climb a closed stepladder.  Make sure that both sides of the stepladder are locked into place.  Ask someone to hold it for you, if possible.  Clearly mark and make sure that you can see:  Any grab bars or handrails.  First and last steps.  Where the edge of each step is.  Use tools that help you move around (mobility aids) if they are needed. These include:  Canes.  Walkers.  Scooters.  Crutches.  Turn on the lights when you go into a dark area. Replace any light bulbs as soon as they burn out.  Set up your furniture so you have a clear path. Avoid moving your furniture around.  If any of your floors are uneven, fix them.  If there are any pets around you, be aware of where they are.  Review your medicines with your doctor. Some medicines can make you feel dizzy. This can increase your chance of falling. Ask your doctor what other things that you can do to help prevent falls. This information is not intended to replace advice given to you by your health care provider. Make sure you discuss any questions you have with your health care provider. Document Released: 10/20/2008 Document Revised: 06/01/2015 Document Reviewed: 01/28/2014 Elsevier Interactive Patient Education  2017 [employer].

## 2019-06-23 NOTE — Progress Notes (Signed)
Subjective:   Randy Dalton is a 86 y.o. male who presents for Medicare Annual/Subsequent preventive examination.  I connected with Dwain Sarna today by telephone and verified that I am speaking with the correct person using two identifiers. Location patient: home Location provider: work Persons participating in the virtual visit: patient, provider.   I discussed the limitations, risks, security and privacy concerns of performing an evaluation and management service by telephone and the availability of in person appointments. I also discussed with the patient that there may be a patient responsible charge related to this service. The patient expressed understanding and verbally consented to this telephonic visit.    Interactive audio and video telecommunications were attempted between this provider and patient, however failed, due to patient having technical difficulties OR patient did not have access to video capability.  We continued and completed visit with audio only.  Review of Systems:  N/A  Cardiac Risk Factors include: advanced age (>71men, >26 women);dyslipidemia;male gender;diabetes mellitus;hypertension;obesity (BMI >30kg/m2)     Objective:    Vitals: There were no vitals taken for this visit.  There is no height or weight on file to calculate BMI.  Advanced Directives 06/23/2019 06/22/2018 06/16/2017 03/12/2017 01/28/2017 01/28/2017 05/15/2016  Does Patient Have a Medical Advance Directive? No No No No Yes Yes No  Type of Advance Directive - - - - Press photographer;Living will [place];Living will -  Does patient want to make changes to medical advance directive? - - - - No - Patient declined - -  Would patient like information on creating a medical advance directive? No - Patient declined No - Patient declined No - Patient declined - - - No - Patient declined    Tobacco Social History   Tobacco Use  Smoking Status Never Smoker  Smokeless  Tobacco Never Used     Counseling given: Not Answered   Clinical Intake:  Pre-visit preparation completed: Yes  Pain : No/denies pain     Nutritional Risks: None Diabetes: Yes  How often do you need to have someone help you when you read instructions, pamphlets, or other written materials from your doctor or pharmacy?: 1 - Never   Diabetes:  Is the patient diabetic?  Yes  If diabetic, was a CBG obtained today?  No  Did the patient bring in their glucometer from home?  No  How often do you monitor your CBG's? Once every 1-2 weeks.   Financial Strains and Diabetes Management:  Are you having any financial strains with the device, your supplies or your medication? No .  Does the patient want to be seen by Chronic Care Management for management of their diabetes?  No  Would the patient like to be referred to a Nutritionist or for Diabetic Management?  No   Diabetic Exams:  Diabetic Eye Exam: Completed 05/07/17. Overdue for diabetic eye exam. Pt has been advised about the importance in completing this exam. Pt plans to schedule an eye exam within the next 6 month at [facility].  Diabetic Foot Exam: Completed 02/20/18. Pt has been advised about the importance in completing this exam. Note made to follow up on this at next in office apt (06/24/19).   Interpreter Needed?: No  Information entered by :: [facility], LPN  Past Medical History:  Diagnosis Date  . CHF (congestive heart failure) ([place])   . Chronic kidney disease   . Diabetes mellitus without complication ([place])   . Diverticulosis   . GERD (gastroesophageal  reflux disease)   . Hypercholesteremia   . Hypertension   . Hypothyroidism   . Obstructive sleep apnea    CPAP  . Wears dentures    partial top   Past Surgical History:  Procedure Laterality Date  . BACK SURGERY    . CATARACT EXTRACTION W/PHACO Left 10/24/2014   Procedure: CATARACT EXTRACTION PHACO AND INTRAOCULAR LENS PLACEMENT (IOC);  Surgeon: Ronnell Freshwater, MD;  Location: [facility];  Service: Ophthalmology;  Laterality: Left;  DIABETIC - oral meds  . CATARACT EXTRACTION W/PHACO Right 06/17/2017   Procedure: CATARACT EXTRACTION PHACO AND INTRAOCULAR LENS PLACEMENT (IOC);  Surgeon: Birder Robson, MD;  Location: [facility];  Service: Ophthalmology;  Laterality: Right;  [place] 01:43 AP% 19.7 CDE 20.48 Fluid pack lot # [id number]  . COLONOSCOPY    . HERNIA REPAIR    . PILONIDAL CYST EXCISION    . STOMACH SURGERY     Family History  Problem Relation Age of Onset  . Hypertension Mother   . Kidney failure Mother   . Diabetes Father   . Lung cancer Brother   . Colon cancer Brother   . Cancer Brother        skin, removed  . Dementia Sister   . Dementia Sister   . Hypertension Sister    Social History   Socioeconomic History  . Marital status: Married    Spouse name: Not on file  . Number of children: 1  . Years of education: Not on file  . Highest education level: High school graduate  Occupational History  . Occupation: retired  Tobacco Use  . Smoking status: Never Smoker  . Smokeless tobacco: Never Used  Vaping Use  . Vaping Use: Never used  Substance and Sexual Activity  . Alcohol use: No    Alcohol/week: 0.0 standard drinks  . Drug use: No  . Sexual activity: Not Currently  Other Topics Concern  . Not on file  Social History Narrative  . Not on file   Social Determinants of Health   Financial Resource Strain: Low Risk   . Difficulty of Paying Living Expenses: Not hard at all  Food Insecurity: No Food Insecurity  . Worried About Charity fundraiser in the Last Year: Never true  . Ran Out of Food in the Last Year: Never true  Transportation Needs: No Transportation Needs  . Lack of Transportation (Medical): No  . Lack of Transportation (Non-Medical): No  Physical Activity: Inactive  . Days of Exercise per Week: 0 days  . Minutes of Exercise per Session: 0 min  Stress: No Stress Concern Present  .  Feeling of Stress : Not at all  Social Connections: Socially Integrated  . Frequency of Communication with Friends and Family: More than three times a week  . Frequency of Social Gatherings with Friends and Family: More than three times a week  . Attends Religious Services: More than 4 times per year  . Active Member of Clubs or Organizations: Yes  . Attends Archivist Meetings: More than 4 times per year  . Marital Status: Married   Declined reviewing medication over the phone. Outpatient Encounter Medications as of 06/23/2019  Medication Sig  . amLODipine (NORVASC) 10 MG tablet TAKE 1 TABLET(10 MG) BY MOUTH DAILY  . carvedilol (COREG) 12.5 MG tablet TAKE 1 TABLET BY MOUTH TWICE DAILY  . Cholecalciferol (VITAMIN D) 2000 UNITS tablet Take 2,000 Units by mouth daily.   Marland Kitchen glimepiride (AMARYL) 4 MG tablet  Take 1 tablet (4 mg total) by mouth daily.  . hydrALAZINE (APRESOLINE) 50 MG tablet TAKE 1 TABLET BY MOUTH TWICE DAILY  . levothyroxine (SYNTHROID) 175 MCG tablet TAKE 1 TABLET(175 MCG) BY MOUTH DAILY  . linagliptin (TRADJENTA) 5 MG TABS tablet Take 1 tablet (5 mg total) by mouth daily.  Marland Kitchen losartan (COZAAR) 50 MG tablet Take 1 tablet (50 mg total) by mouth daily.  . meloxicam (MOBIC) 7.5 MG tablet Take 1 tablet (7.5 mg total) by mouth 2 (two) times daily.  . Multiple Vitamin (MULTIVITAMIN) tablet Take 1 tablet by mouth daily.  . potassium chloride SA (K-DUR,KLOR-CON) 20 MEQ tablet Take 1 tablet by mouth every other day.   . sildenafil (VIAGRA) 100 MG tablet Take 100 mg by mouth as needed.   . simvastatin (ZOCOR) 20 MG tablet TAKE ONE TABLET BY MOUTH AT BEDTIME.  Marland Kitchen torsemide (DEMADEX) 20 MG tablet Take 20 mg by mouth daily.   No facility-administered encounter medications on file as of 06/23/2019.    Activities of Daily Living In your present state of health, do you have any difficulty performing the following activities: 06/23/2019  Hearing? N  Vision? N  Difficulty  concentrating or making decisions? N  Walking or climbing stairs? N  Dressing or bathing? N  Doing errands, shopping? N  Preparing Food and eating ? N  Using the Toilet? N  In the past six months, have you accidently leaked urine? N  Do you have problems with loss of bowel control? N  Managing your Medications? N  Managing your Finances? N  Housekeeping or managing your Housekeeping? N  Some recent data might be hidden    Patient Care Team: Jerrol Banana., MD as PCP - General (Family Medicine) Lavonia Dana, MD as Consulting Physician (Internal Medicine) Dionisio David, MD as Consulting Physician (Cardiology) Birder Robson, MD as Referring Physician (Ophthalmology)   Assessment:   This is a routine wellness examination for [place].  Exercise Activities and Dietary recommendations Current Exercise Habits: The patient does not participate in regular exercise at present, Exercise limited by: orthopedic condition(s);Other - see comments (flat footed)  Goals    . Increase water intake     Recommend increasing water intake to 4-6 glasses a day.       Fall Risk Fall Risk  06/23/2019 06/22/2018 09/15/2017 06/16/2017 03/10/2017  Falls in the past year? 0 0 No No No  Number falls in past yr: 0 - - - -  Injury with Fall? 0 - - - -   FALL RISK PREVENTION PERTAINING TO THE HOME:  Any stairs in or around the home? Yes  If so, are there any without handrails? No   Home free of loose throw rugs in walkways, pet beds, electrical cords, etc? Yes  Adequate lighting in your home to reduce risk of falls? Yes   ASSISTIVE DEVICES UTILIZED TO PREVENT FALLS:  Life alert? No  Use of a cane, walker or w/c? No  Grab bars in the bathroom? Yes  Shower chair or bench in shower? Yes  Elevated toilet seat or a handicapped toilet? No    TIMED UP AND GO:  Was the test performed? No .    Depression Screen PHQ 2/9 Scores 06/23/2019 06/22/2018 09/15/2017 06/16/2017  PHQ - 2 Score 0 0 0 0    PHQ- 9 Score - - - -    Cognitive Function: Declined today.     6CIT Screen 06/22/2018 05/15/2016  What Year? 0 points  0 points  What month? 0 points 0 points  What time? 0 points 0 points  Count back from 20 0 points 0 points  Months in reverse 4 points 4 points  Repeat phrase 4 points 2 points  Total Score 8 6    Immunization History  Administered Date(s) Administered  . Influenza, High Dose Seasonal PF 11/23/2014, 10/23/2015, 10/31/2016, 10/15/2017, 10/21/2018  . Pneumococcal Conjugate-13 12/13/2013  . Pneumococcal Polysaccharide-23 06/08/2012  . Td 04/28/2003    Qualifies for Shingles Vaccine? Yes . Due for Shingrix. Pt has been advised to call insurance company to determine out of pocket expense. Advised may also receive vaccine at local pharmacy or Health Dept. Verbalized acceptance and understanding.  Tdap: Although this vaccine is not a covered service during a Wellness Exam, does the patient still wish to receive this vaccine today?  No . Advised may receive this vaccine at local pharmacy or Health Dept. Aware to provide a copy of the vaccination record if obtained from local pharmacy or Health Dept. Verbalized acceptance and understanding.  Flu Vaccine: Up to date  Pneumococcal Vaccine: Completed series  Screening Tests Health Maintenance  Topic Date Due  . COVID-19 Vaccine (1) Never done  . OPHTHALMOLOGY EXAM  05/08/2018  . FOOT EXAM  02/21/2019  . TETANUS/TDAP  01/07/2026 (Originally 04/27/2013)  . INFLUENZA VACCINE  08/08/2019  . HEMOGLOBIN A1C  08/29/2019  . PNA vac Low Risk Adult  Completed   Cancer Screenings:  Colorectal Screening: No longer required.   Lung Cancer Screening: (Low Dose CT Chest recommended if Age 90-80 years, 30 pack-year currently smoking OR have quit w/in 15years.) does not qualify.   Additional Screening:  Dental Screening: Recommended annual dental exams for proper oral hygiene  Community Resource Referral:  CRR required this  visit? No      Plan:  I have personally reviewed and addressed the Medicare Annual Wellness questionnaire and have noted the following in the patient's chart:  A. Medical and social history B. Use of alcohol, tobacco or illicit drugs  C. Current medications and supplements D. Functional ability and status E.  Nutritional status F.  Physical activity G. Advance directives H. List of other physicians I.  Hospitalizations, surgeries, and ER visits in previous 12 months J.  [place] such as hearing and vision if needed, cognitive and depression L. Referrals and appointments   In addition, I have reviewed and discussed with patient certain preventive protocols, quality metrics, and best practice recommendations. A written personalized care plan for preventive services as well as general preventive health recommendations were provided to patient.   Glendora Score, [place]  9/92/4268 Nurse Health Advisor  Nurse Notes: Pt needs a diabetic foot exam at [place] in office apt. Pt plans to schedule an eye exam in the next 6 month. Requested records to be faxed to clinic once completed. Pt declined reviewing his medications over the phone today but stated he would verify them at [place] in office apt- FYI!

## 2019-06-24 ENCOUNTER — Ambulatory Visit: Payer: Medicare Other

## 2019-06-24 ENCOUNTER — Ambulatory Visit (INDEPENDENT_AMBULATORY_CARE_PROVIDER_SITE_OTHER): Payer: Medicare Other | Admitting: Family Medicine

## 2019-06-24 VITALS — BP 169/73 | HR 51 | Temp 99.0°F | Resp 18 | Ht 68.0 in | Wt 209.0 lb

## 2019-06-24 DIAGNOSIS — I5032 Chronic diastolic (congestive) heart failure: Secondary | ICD-10-CM

## 2019-06-24 DIAGNOSIS — I10 Essential (primary) hypertension: Secondary | ICD-10-CM

## 2019-06-24 DIAGNOSIS — E1121 Type 2 diabetes mellitus with diabetic nephropathy: Secondary | ICD-10-CM

## 2019-06-24 DIAGNOSIS — E039 Hypothyroidism, unspecified: Secondary | ICD-10-CM | POA: Diagnosis not present

## 2019-06-24 DIAGNOSIS — N1831 Chronic kidney disease, stage 3a: Secondary | ICD-10-CM | POA: Diagnosis not present

## 2019-06-24 DIAGNOSIS — E782 Mixed hyperlipidemia: Secondary | ICD-10-CM

## 2019-06-24 DIAGNOSIS — E1122 Type 2 diabetes mellitus with diabetic chronic kidney disease: Secondary | ICD-10-CM

## 2019-06-24 MED ORDER — LOSARTAN POTASSIUM 100 MG PO TABS: 100.0000 mg | ORAL_TABLET | Freq: Every day | ORAL | 1 refills | Status: DC

## 2019-06-25 LAB — COMPREHENSIVE METABOLIC PANEL
ALT: 12 IU/L (ref 0–44)
AST: 17 IU/L (ref 0–40)
Albumin/Globulin Ratio: 1.6 (ref 1.2–2.2)
Albumin: 4.4 g/dL (ref 3.5–4.6)
Alkaline Phosphatase: 79 IU/L (ref 48–121)
BUN/Creatinine Ratio: 23 (ref 10–24)
BUN: 56 mg/dL — ABNORMAL HIGH (ref 10–36)
Bilirubin Total: 0.5 mg/dL (ref 0.0–1.2)
CO2: 21 mmol/L (ref 20–29)
Calcium: 10.2 mg/dL (ref 8.6–10.2)
Chloride: 102 mmol/L (ref 96–106)
Creatinine, Ser: 2.44 mg/dL — ABNORMAL HIGH (ref 0.76–1.27)
GFR calc Af Amer: 26 mL/min/{1.73_m2} — ABNORMAL LOW (ref >59)
GFR calc non Af Amer: 22 mL/min/{1.73_m2} — ABNORMAL LOW (ref >59)
Globulin, Total: 2.8 g/dL (ref 1.5–4.5)
Glucose: 222 mg/dL — ABNORMAL HIGH (ref 65–99)
Potassium: 4.6 mmol/L (ref 3.5–5.2)
Sodium: 140 mmol/L (ref 134–144)
Total Protein: 7.2 g/dL (ref 6.0–8.5)

## 2019-06-25 LAB — CBC WITH DIFFERENTIAL/PLATELET
Basophils Absolute: 0.1 10*3/uL (ref 0.0–0.2)
Basos: 1 %
EOS (ABSOLUTE): 0.3 10*3/uL (ref 0.0–0.4)
Eos: 4 %
Hematocrit: 41.8 % (ref 37.5–51.0)
Hemoglobin: 14.3 g/dL (ref 13.0–17.7)
Immature Grans (Abs): 0 10*3/uL (ref 0.0–0.1)
Immature Granulocytes: 0 %
Lymphocytes Absolute: 2.1 10*3/uL (ref 0.7–3.1)
Lymphs: 26 %
MCH: 32.3 pg (ref 26.6–33.0)
MCHC: 34.2 g/dL (ref 31.5–35.7)
MCV: 94 fL (ref 79–97)
Monocytes Absolute: 0.8 10*3/uL (ref 0.1–0.9)
Monocytes: 10 %
Neutrophils Absolute: 4.8 10*3/uL (ref 1.4–7.0)
Neutrophils: 59 %
Platelets: 163 10*3/uL (ref 150–450)
RBC: 4.43 x10E6/uL (ref 4.14–5.80)
RDW: 12.5 % (ref 11.6–15.4)
WBC: 8 10*3/uL (ref 3.4–10.8)

## 2019-06-25 LAB — LIPID PANEL
Chol/HDL Ratio: 3.9 ratio (ref 0.0–5.0)
Cholesterol, Total: 139 mg/dL (ref 100–199)
HDL: 36 mg/dL — ABNORMAL LOW (ref >39)
LDL Chol Calc (NIH): 79 mg/dL (ref 0–99)
Triglycerides: 136 mg/dL (ref 0–149)
VLDL Cholesterol Cal: 24 mg/dL (ref 5–40)

## 2019-06-25 LAB — HEMOGLOBIN A1C
Est. average glucose Bld gHb Est-mCnc: 194 mg/dL
Hgb A1c MFr Bld: 8.4 % — ABNORMAL HIGH (ref 4.8–5.6)

## 2019-06-25 LAB — TSH: TSH: 5.06 u[IU]/mL — ABNORMAL HIGH (ref 0.450–4.500)

## 2019-06-30 ENCOUNTER — Telehealth: Payer: Self-pay

## 2019-06-30 NOTE — Telephone Encounter (Signed)
-----   Message from Jerrol Banana., MD sent at 06/29/2019  9:02 AM EDT ----- Labs stable.  Kidney function about the same.

## 2019-06-30 NOTE — Telephone Encounter (Signed)
Patient has been advised of lab results.

## 2019-07-02 ENCOUNTER — Other Ambulatory Visit: Payer: Self-pay | Admitting: Family Medicine

## 2019-07-14 DIAGNOSIS — N184 Chronic kidney disease, stage 4 (severe): Secondary | ICD-10-CM | POA: Insufficient documentation

## 2019-07-16 ENCOUNTER — Other Ambulatory Visit: Payer: Self-pay | Admitting: Family Medicine

## 2019-07-16 DIAGNOSIS — I10 Essential (primary) hypertension: Secondary | ICD-10-CM

## 2019-07-16 NOTE — Telephone Encounter (Signed)
Requested Prescriptions  Pending Prescriptions Disp Refills  . losartan (COZAAR) 100 MG tablet [Pharmacy Med Name: LOSARTAN POTASSIUM 100 MG TAB] 90 tablet 0    Sig: TAKE 1 TABLET BY MOUTH EVERY DAY     Cardiovascular:  Angiotensin Receptor Blockers Failed - 07/16/2019 10:32 AM      Failed - Cr in normal range and within 180 days    Creatinine, Ser  Date Value Ref Range Status  06/24/2019 2.44 (H) 0.76 - 1.27 mg/dL Final         Failed - Last BP in normal range    BP Readings from Last 1 Encounters:  06/24/19 (!) 169/73         Passed - K in normal range and within 180 days    Potassium  Date Value Ref Range Status  06/24/2019 4.6 3.5 - 5.2 mmol/L Final         Passed - Patient is not pregnant      Passed - Valid encounter within last 6 months    Recent Outpatient Visits          3 weeks ago Type 2 diabetes mellitus with stage 3a chronic kidney disease, without long-term current use of insulin ([place])   [facility] Jerrol Banana., MD   4 months ago Type 2 diabetes mellitus with stage 3a chronic kidney disease, without long-term current use of insulin ([place])   [facility] Jerrol Banana., MD   8 months ago Type 2 diabetes mellitus with stage 3a chronic kidney disease, without long-term current use of insulin [facility])   [facility] Jerrol Banana., MD   1 year ago Type 2 diabetes mellitus with stage 3 chronic kidney disease, without long-term current use of insulin [facility])   [facility] Jerrol Banana[facility] MD   1 year ago Type 2 diabetes mellitus with stage 3 chronic kidney disease, without long-term current use of insulin [facility])   [facility] Jerrol Banana., MD      Future Appointments            In 1 month Jerrol Banana., MD [facility], [facility]           Lab note states kidney function about the same

## 2019-08-31 NOTE — Progress Notes (Signed)
I,[name],acting as a scribe for [name], MD.,have documented all relevant documentation on the behalf of [name], MD,as directed by  [name], MD while in the presence of [name], MD.   Established patient visit   Patient: Randy Dalton   DOB: 09/29/1927   86 y.o. Male  MRN: [id number] Visit Date: 09/02/2019  Today's healthcare provider: [name], MD   Chief Complaint  Patient presents with  . Follow-up  . Hypertension   Subjective    HPI  86 year old patient feels well and has no complaints.  Looks as though nephrology decrease his losartan possibly due to progressive CKD.  50 mg daily.  Patient has had Covid vaccines. Hypertension, follow-up  BP Readings from Last 3 Encounters:  09/02/19 130/69  06/24/19 (!) 169/73  02/22/19 120/67   Wt Readings from Last 3 Encounters:  09/02/19 206 lb (93.4 kg)  06/24/19 209 lb (94.8 kg)  02/22/19 212 lb (96.2 kg)     He was last seen for hypertension 2 months ago.  BP at that visit was 169/73. Management since that visit includes; Increased losartan from 50 to 100 mg daily, return to clinic1- 2 months. He reports good compliance with treatment. He is not having side effects. none He is exercising. He is adherent to low salt diet.   Outside blood pressures are checks occasionally.  He does not smoke.  Use of agents associated with hypertension: none.   --------------------------------------------------------------------   Past Medical History:  Diagnosis Date  . CHF (congestive heart failure) ([place])   . Chronic kidney disease   . Diabetes mellitus without complication ([place])   . Diverticulosis   . GERD (gastroesophageal reflux disease)   . Hypercholesteremia   . Hypertension   . Hypothyroidism   . Obstructive sleep apnea    CPAP  . Wears dentures    partial top       Medications: Outpatient Medications Prior to Visit  Medication Sig  . amLODipine  (NORVASC) 10 MG tablet TAKE 1 TABLET(10 MG) BY MOUTH DAILY  . carvedilol (COREG) 12.5 MG tablet TAKE 1 TABLET BY MOUTH TWICE DAILY  . Cholecalciferol (VITAMIN D) 2000 UNITS tablet Take 2,000 Units by mouth daily.   Marland Kitchen glimepiride (AMARYL) 4 MG tablet Take 1 tablet (4 mg total) by mouth daily.  . hydrALAZINE (APRESOLINE) 50 MG tablet TAKE 1 TABLET BY MOUTH TWICE DAILY  . levothyroxine (SYNTHROID) 175 MCG tablet TAKE 1 TABLET(175 MCG) BY MOUTH DAILY  . losartan (COZAAR) 100 MG tablet TAKE 1 TABLET BY MOUTH EVERY DAY  . meloxicam (MOBIC) 7.5 MG tablet Take 1 tablet (7.5 mg total) by mouth 2 (two) times daily.  . Multiple Vitamin (MULTIVITAMIN) tablet Take 1 tablet by mouth daily.  . potassium chloride SA (K-DUR,KLOR-CON) 20 MEQ tablet Take 1 tablet by mouth every other day.   . sildenafil (VIAGRA) 100 MG tablet Take 100 mg by mouth as needed.   . simvastatin (ZOCOR) 20 MG tablet TAKE ONE TABLET BY MOUTH AT BEDTIME.  Marland Kitchen torsemide (DEMADEX) 20 MG tablet Take 20 mg by mouth daily.  . TRADJENTA 5 MG TABS tablet TAKE 1 TABLET BY MOUTH DAILY   No facility-administered medications prior to visit.    Review of Systems  Constitutional: Negative for appetite change, chills and fever.  Respiratory: Negative for chest tightness, shortness of breath and wheezing.   Cardiovascular: Negative for chest pain and palpitations.  Gastrointestinal: Negative for abdominal pain, nausea and  vomiting.    Last hemoglobin A1c Lab Results  Component Value Date   HGBA1C 8.4 (H) 06/24/2019      Objective    BP 130/69 (BP Location: Left Arm, Patient Position: Sitting, Cuff Size: Large)   Pulse 62   Temp 97.9 F (36.6 C) (Oral)   Resp 18   Ht 5\' 8"  (1.727 m)   Wt 206 lb (93.4 kg)   SpO2 99%   BMI 31.32 kg/m  BP Readings from Last 3 Encounters:  09/02/19 130/69  06/24/19 (!) 169/73  02/22/19 120/67   Wt Readings from Last 3 Encounters:  09/02/19 206 lb (93.4 kg)  06/24/19 209 lb (94.8 kg)  02/22/19 212  lb (96.2 kg)      Physical Exam  BP 130/69 (BP Location: Left Arm, Patient Position: Sitting, Cuff Size: Large)   Pulse 62   Temp 97.9 F (36.6 C) (Oral)   Resp 18   Ht 5\' 8"  (1.727 m)   Wt 206 lb (93.4 kg)   SpO2 99%   BMI 31.32 kg/m   General Appearance:    Alert, cooperative, no distress, appears stated age  Head:    Normocephalic, without obvious abnormality, atraumatic  Eyes:    PERRL, conjunctiva/corneas clear, EOM's intact, fundi    benign, both eyes       Ears:    Normal TM's and external ear canals, both ears  Nose:   Nares normal, septum midline, mucosa normal, no drainage   or sinus tenderness  Throat:   Lips, mucosa, and tongue normal; teeth and gums normal  Neck:   Supple, symmetrical, trachea midline, no adenopathy;       thyroid:  No enlargement/tenderness/nodules; no carotid   bruit or JVD  Back:     Symmetric, no curvature, ROM normal, no CVA tenderness  Lungs:     Clear to auscultation bilaterally, respirations unlabored  Chest wall:    No tenderness or deformity  Heart:    Regular rate and rhythm, S1 and S2 normal, no murmur, rub   or gallop  Abdomen:     Soft, non-tender, bowel sounds active all four quadrants,    no masses, no organomegaly  Genitalia:    Normal male without lesion, discharge or tenderness  Rectal:    Normal tone, normal prostate, no masses or tenderness;   guaiac negative stool  Extremities:   Extremities normal, atraumatic, no cyanosis or edema  Pulses:   2+ and symmetric all extremities  Skin:   Skin color, texture, turgor normal, no rashes or lesions  Lymph nodes:   Cervical, supraclavicular, and axillary nodes normal  Neurologic:   CNII-XII intact. Normal strength, sensation and reflexes      throughout     No results found for any visits on 09/02/19.  Assessment & Plan     1. Essential (primary) hypertension Decrease losartan from 100 mg to 50 mg daily. - Renal function panel  2. Type 2 diabetes mellitus with stage 3a  chronic kidney disease, without long-term current use of insulin (HCC)  - Renal function panel  3. Chronic kidney disease, unspecified CKD stage Followed by nephrology. - Renal function panel    Return in about 2 months (around 11/02/2019).      I, [name], MD, have reviewed all documentation for this visit. The documentation on 09/05/19 for the exam, diagnosis, procedures, and orders are all accurate and complete.    Deitra Craine Cranford Mon, MD  [facility] [phone] (phone) [phone] (  fax)  [place]

## 2019-09-02 ENCOUNTER — Other Ambulatory Visit: Payer: Self-pay

## 2019-09-02 ENCOUNTER — Encounter: Payer: Self-pay | Admitting: Family Medicine

## 2019-09-02 ENCOUNTER — Ambulatory Visit (INDEPENDENT_AMBULATORY_CARE_PROVIDER_SITE_OTHER): Payer: Medicare Other | Admitting: Family Medicine

## 2019-09-02 VITALS — BP 130/69 | HR 62 | Temp 97.9°F | Resp 18 | Ht 68.0 in | Wt 206.0 lb

## 2019-09-02 DIAGNOSIS — N1831 Chronic kidney disease, stage 3a: Secondary | ICD-10-CM

## 2019-09-02 DIAGNOSIS — E1122 Type 2 diabetes mellitus with diabetic chronic kidney disease: Secondary | ICD-10-CM

## 2019-09-02 DIAGNOSIS — N189 Chronic kidney disease, unspecified: Secondary | ICD-10-CM | POA: Diagnosis not present

## 2019-09-02 DIAGNOSIS — E1121 Type 2 diabetes mellitus with diabetic nephropathy: Secondary | ICD-10-CM | POA: Diagnosis not present

## 2019-09-02 DIAGNOSIS — I10 Essential (primary) hypertension: Secondary | ICD-10-CM | POA: Diagnosis not present

## 2019-09-02 NOTE — Patient Instructions (Signed)
Decrease losartan from 100 mg to 50 mg daily.

## 2019-09-03 ENCOUNTER — Telehealth: Payer: Self-pay

## 2019-09-03 LAB — RENAL FUNCTION PANEL
Albumin: 4.3 g/dL (ref 3.5–4.6)
BUN/Creatinine Ratio: 18 (ref 10–24)
BUN: 37 mg/dL — ABNORMAL HIGH (ref 10–36)
CO2: 20 mmol/L (ref 20–29)
Calcium: 10.2 mg/dL (ref 8.6–10.2)
Chloride: 99 mmol/L (ref 96–106)
Creatinine, Ser: 2.1 mg/dL — ABNORMAL HIGH (ref 0.76–1.27)
GFR calc Af Amer: 31 mL/min/{1.73_m2} — ABNORMAL LOW (ref >59)
GFR calc non Af Amer: 27 mL/min/{1.73_m2} — ABNORMAL LOW (ref >59)
Glucose: 249 mg/dL — ABNORMAL HIGH (ref 65–99)
Phosphorus: 2.7 mg/dL — ABNORMAL LOW (ref 2.8–4.1)
Potassium: 4.5 mmol/L (ref 3.5–5.2)
Sodium: 138 mmol/L (ref 134–144)

## 2019-09-03 NOTE — Telephone Encounter (Signed)
-----   Message from Jerrol Banana., MD sent at 09/03/2019  8:42 AM EDT ----- Stable.

## 2019-09-03 NOTE — Telephone Encounter (Signed)
Patient advised of lab results

## 2019-09-25 ENCOUNTER — Other Ambulatory Visit: Payer: Self-pay | Admitting: Family Medicine

## 2019-10-07 ENCOUNTER — Other Ambulatory Visit: Payer: Self-pay | Admitting: Family Medicine

## 2019-10-07 DIAGNOSIS — I10 Essential (primary) hypertension: Secondary | ICD-10-CM

## 2019-10-07 NOTE — Telephone Encounter (Signed)
Requested Prescriptions  Pending Prescriptions Disp Refills   losartan (COZAAR) 100 MG tablet [Pharmacy Med Name: LOSARTAN POTASSIUM 100 MG TAB] 90 tablet 0    Sig: TAKE 1 TABLET BY MOUTH EVERY DAY     Cardiovascular:  Angiotensin Receptor Blockers Failed - 10/07/2019  1:28 AM      Failed - Cr in normal range and within 180 days    Creatinine, Ser  Date Value Ref Range Status  09/02/2019 2.10 (H) 0.76 - 1.27 mg/dL Final         Passed - K in normal range and within 180 days    Potassium  Date Value Ref Range Status  09/02/2019 4.5 3.5 - 5.2 mmol/L Final         Passed - Patient is not pregnant      Passed - Last BP in normal range    BP Readings from Last 1 Encounters:  09/02/19 130/69         Passed - Valid encounter within last 6 months    Recent Outpatient Visits          1 month ago Essential (primary) hypertension   [facility] Jerrol Banana., MD   3 months ago Type 2 diabetes mellitus with stage 3a chronic kidney disease, without long-term current use of insulin ([place])   [facility] Jerrol Banana., MD   7 months ago Type 2 diabetes mellitus with stage 3a chronic kidney disease, without long-term current use of insulin [facility])   [facility] Jerrol Banana., MD   11 months ago Type 2 diabetes mellitus with stage 3a chronic kidney disease, without long-term current use of insulin [facility])   [facility] Jerrol Banana., MD   1 year ago Type 2 diabetes mellitus with stage 3 chronic kidney disease, without long-term current use of insulin [facility])   [facility] Jerrol Banana., MD      Future Appointments            In 1 month Jerrol Banana., MD [facility]

## 2019-10-13 DIAGNOSIS — N184 Chronic kidney disease, stage 4 (severe): Secondary | ICD-10-CM | POA: Diagnosis not present

## 2019-10-13 DIAGNOSIS — E1122 Type 2 diabetes mellitus with diabetic chronic kidney disease: Secondary | ICD-10-CM | POA: Diagnosis not present

## 2019-10-19 DIAGNOSIS — I10 Essential (primary) hypertension: Secondary | ICD-10-CM | POA: Diagnosis not present

## 2019-10-19 DIAGNOSIS — I509 Heart failure, unspecified: Secondary | ICD-10-CM | POA: Diagnosis not present

## 2019-10-19 DIAGNOSIS — G4739 Other sleep apnea: Secondary | ICD-10-CM | POA: Diagnosis not present

## 2019-10-19 DIAGNOSIS — E785 Hyperlipidemia, unspecified: Secondary | ICD-10-CM | POA: Diagnosis not present

## 2019-10-19 DIAGNOSIS — Z23 Encounter for immunization: Secondary | ICD-10-CM | POA: Diagnosis not present

## 2019-10-27 DIAGNOSIS — E1122 Type 2 diabetes mellitus with diabetic chronic kidney disease: Secondary | ICD-10-CM | POA: Diagnosis not present

## 2019-10-27 DIAGNOSIS — I10 Essential (primary) hypertension: Secondary | ICD-10-CM | POA: Diagnosis not present

## 2019-10-27 DIAGNOSIS — N184 Chronic kidney disease, stage 4 (severe): Secondary | ICD-10-CM | POA: Diagnosis not present

## 2019-10-27 DIAGNOSIS — R809 Proteinuria, unspecified: Secondary | ICD-10-CM | POA: Diagnosis not present

## 2019-10-27 DIAGNOSIS — I701 Atherosclerosis of renal artery: Secondary | ICD-10-CM | POA: Diagnosis not present

## 2019-10-30 ENCOUNTER — Other Ambulatory Visit: Payer: Self-pay | Admitting: Family Medicine

## 2019-11-09 ENCOUNTER — Other Ambulatory Visit: Payer: Self-pay

## 2019-11-09 ENCOUNTER — Encounter: Payer: Self-pay | Admitting: Family Medicine

## 2019-11-09 ENCOUNTER — Ambulatory Visit (INDEPENDENT_AMBULATORY_CARE_PROVIDER_SITE_OTHER): Payer: Medicare Other | Admitting: Family Medicine

## 2019-11-09 VITALS — BP 121/62 | HR 60 | Temp 98.0°F | Resp 18 | Ht 68.0 in | Wt 210.0 lb

## 2019-11-09 DIAGNOSIS — N1831 Chronic kidney disease, stage 3a: Secondary | ICD-10-CM | POA: Diagnosis not present

## 2019-11-09 DIAGNOSIS — I10 Essential (primary) hypertension: Secondary | ICD-10-CM | POA: Diagnosis not present

## 2019-11-09 DIAGNOSIS — Z23 Encounter for immunization: Secondary | ICD-10-CM | POA: Diagnosis not present

## 2019-11-09 DIAGNOSIS — E1122 Type 2 diabetes mellitus with diabetic chronic kidney disease: Secondary | ICD-10-CM

## 2019-11-09 LAB — POCT GLYCOSYLATED HEMOGLOBIN (HGB A1C)
Est. average glucose Bld gHb Est-mCnc: 226
Hemoglobin A1C: 9.5 % — AB (ref 4.0–5.6)

## 2019-11-09 NOTE — Progress Notes (Signed)
I,Randy Dalton,acting as a scribe for Randy Durie, MD.,have documented all relevant documentation on the behalf of Randy Durie, MD,as directed by  Randy Durie, MD while in the presence of Randy Durie, MD.   Established patient visit   Patient: Randy Dalton   DOB: July 05, 1927   86 y.o. Male  MRN: [id number] Visit Date: 11/09/2019  Today's healthcare provider: [name], MD   Chief Complaint  Patient presents with  . Diabetes  . Follow-up  . Hypertension   Subjective    HPI  Patient is remarkably active for 86 year old.  He feels well.  He does not get regular exercise but still has his daily routine.  He is not watching his diet very much. Diabetes Mellitus Type II, follow-up  Lab Results  Component Value Date   HGBA1C 9.5 (A) 11/09/2019   HGBA1C 8.4 (H) 06/24/2019   HGBA1C 8.3 (A) 03/01/2019   Last seen for diabetes 5 months ago.  Management since then includes; Decreased losartan from 100 mg to 50 mg daily. He reports good compliance with treatment. He is not having side effects. none  Home blood sugar records: fasting range: normal  Episodes of hypoglycemia? No none   Current insulin regiment: n/a Most Recent Eye Exam: 05/07/2017  --------------------------------------------------------------------  Hypertension, follow-up  BP Readings from Last 3 Encounters:  11/09/19 121/62  09/02/19 130/69  06/24/19 (!) 169/73   Wt Readings from Last 3 Encounters:  11/09/19 210 lb (95.3 kg)  09/02/19 206 lb (93.4 kg)  06/24/19 209 lb (94.8 kg)     He was last seen for hypertension 2 months ago.  BP at that visit was 130/69. Management since that visit includes; Decreased losartan from 100 mg to 50 mg daily. Renal panel checked showing-stable.  He reports good compliance with treatment. He is not having side effects. none He is exercising. He is adherent to low salt diet.   Outside blood pressures are up and down.  He does not  smoke.  Use of agents associated with hypertension: none.   --------------------------------------------------------------------      Medications: Outpatient Medications Prior to Visit  Medication Sig  . amLODipine (NORVASC) 10 MG tablet TAKE 1 TABLET(10 MG) BY MOUTH DAILY  . carvedilol (COREG) 12.5 MG tablet TAKE 1 TABLET BY MOUTH TWICE DAILY  . Cholecalciferol (VITAMIN D) 2000 UNITS tablet Take 2,000 Units by mouth daily.   Marland Kitchen glimepiride (AMARYL) 4 MG tablet Take 1 tablet (4 mg total) by mouth daily.  . hydrALAZINE (APRESOLINE) 50 MG tablet TAKE 1 TABLET BY MOUTH TWICE DAILY  . levothyroxine (SYNTHROID) 175 MCG tablet TAKE 1 TABLET(175 MCG) BY MOUTH DAILY  . losartan (COZAAR) 100 MG tablet TAKE 1 TABLET BY MOUTH EVERY DAY  . meloxicam (MOBIC) 7.5 MG tablet Take 1 tablet (7.5 mg total) by mouth 2 (two) times daily.  . Multiple Vitamin (MULTIVITAMIN) tablet Take 1 tablet by mouth daily.  . potassium chloride SA (K-DUR,KLOR-CON) 20 MEQ tablet Take 1 tablet by mouth every other day.   . sildenafil (VIAGRA) 100 MG tablet Take 100 mg by mouth as needed.   . simvastatin (ZOCOR) 20 MG tablet TAKE 1 TABLET BY MOUTH AT BEDTIME  . torsemide (DEMADEX) 20 MG tablet Take 20 mg by mouth daily.  . TRADJENTA 5 MG TABS tablet TAKE 1 TABLET BY MOUTH DAILY   No facility-administered medications prior to visit.    Review of Systems  Constitutional: Negative for appetite change, chills and fever.  Respiratory: Negative for chest tightness, shortness of breath and wheezing.   Cardiovascular: Negative for chest pain and palpitations.  Gastrointestinal: Negative for abdominal pain, nausea and vomiting.       Objective    BP 121/62 (BP Location: Left Arm, Patient Position: Sitting, Cuff Size: Large)   Pulse 60   Temp 98 F (36.7 C) (Oral)   Resp 18   Ht 5\' 8"  (1.727 m)   Wt 210 lb (95.3 kg)   SpO2 98%   BMI 31.93 kg/m  BP Readings from Last 3 Encounters:  11/09/19 121/62  09/02/19 130/69   06/24/19 (!) 169/73   Wt Readings from Last 3 Encounters:  11/09/19 210 lb (95.3 kg)  09/02/19 206 lb (93.4 kg)  06/24/19 209 lb (94.8 kg)      Physical Exam Vitals and nursing note reviewed.  Constitutional:      Appearance: Normal appearance. He is normal weight.  HENT:     Right Ear: Tympanic membrane normal.     Left Ear: Tympanic membrane normal.     Nose: Nose normal.     Mouth/Throat:     Mouth: Mucous membranes are moist.  Cardiovascular:     Rate and Rhythm: Normal rate and regular rhythm.     Pulses: Normal pulses.     Heart sounds: Normal heart sounds.  Pulmonary:     Effort: Pulmonary effort is normal.     Breath sounds: Normal breath sounds.  Abdominal:     General: Bowel sounds are normal.     Palpations: Abdomen is soft.  Musculoskeletal:     Cervical back: Normal range of motion and neck supple.  Skin:    General: Skin is warm.  Neurological:     General: No focal deficit present.     Mental Status: He is alert and oriented to person, place, and time.  Psychiatric:        Mood and Affect: Mood normal.        Behavior: Behavior normal.        Thought Content: Thought content normal.        Judgment: Judgment normal.       Results for orders placed or performed in visit on 11/09/19  POCT glycosylated hemoglobin (Hb A1C)  Result Value Ref Range   Hemoglobin A1C 9.5 (A) 4.0 - 5.6 %   Est. average glucose Bld gHb Est-mCnc 226     Assessment & Plan     1. Type 2 diabetes mellitus with stage 3a chronic kidney disease, without long-term current use of insulin (HCC) A1c has gone from 8.4-9.48.:  This 86 year old will be less than 8.5.  He is willing to work on his eating habits.  He is on Tradjenta and glimepiride.  Consider doubling glimepiride dose.  Would reluctantly do this because of the risk of hypoglycemia in this 86 year old.  Not really check his blood sugars at home.  Follow-up 3 months.  Have asked him to check his fasting blood sugars in the  morning - POCT glycosylated hemoglobin (Hb A1C)--9.5 toady.  2. Essential (primary) hypertension   3. Need for shingles vaccine  - Varicella-zoster vaccine IM (Shingrix)  No follow-ups on file.         Aliza Moret Cranford Mon, MD  [facility] [phone] (phone) [phone] (fax)  [place]

## 2019-11-17 ENCOUNTER — Telehealth: Payer: Self-pay | Admitting: *Deleted

## 2019-11-17 NOTE — Chronic Care Management (AMB) (Signed)
  Chronic Care Management   Note  11/17/2019 Name: Randy Dalton MRN: [id number] DOB: [birth year]  Randy Dalton is a 86 y.o. year old male who is a primary care patient of Jerrol Banana., MD. I reached out to Sylvan Cheese by phone today in response to a referral sent by Randy Dalton [facility] health plan.     Mr. Danielski was given information about Chronic Care Management services today including:  1. CCM service includes personalized support from designated clinical staff supervised by his physician, including individualized plan of care and coordination with other care providers 2. 24/7 contact phone numbers for assistance for urgent and routine care needs. 3. Service will only be billed when office clinical staff spend 20 minutes or more in a month to coordinate care. 4. Only one practitioner may furnish and bill the service in a calendar month. 5. The patient may stop CCM services at any time (effective at the end of the month) by phone call to the office staff. 6. The patient will be responsible for cost sharing (co-pay) of up to 20% of the service fee (after annual deductible is met).  Patient agreed to services and verbal consent obtained.   Follow up plan: Telephone appointment with care management team member scheduled for:12/07/2019  [place] [facility]  Direct Dial: [phone]

## 2019-12-07 ENCOUNTER — Ambulatory Visit: Payer: Medicare Other

## 2019-12-08 NOTE — Chronic Care Management (AMB) (Signed)
  Chronic Care Management   Outreach Note  12/08/2019 Name: Randy Dalton MRN: [id number] DOB: [birth year]    Primary Care Provider: Jerrol Banana., MD Reason for referral : Chronic Care Management   Mr. Ala was referred to the case management team for assistance with care management and care coordination. The initial telephonic assessment was attempted today. Mr. Arizpe reports doing well and declined need for ongoing chronic care management. He agreed to inform his primary care provider if his needs change and assistance is required. The care management team will gladly assist.    Cristy [facility] Care Management [facility] [phone]

## 2019-12-09 DIAGNOSIS — Z23 Encounter for immunization: Secondary | ICD-10-CM | POA: Diagnosis not present

## 2019-12-18 ENCOUNTER — Other Ambulatory Visit: Payer: Self-pay | Admitting: Family Medicine

## 2019-12-18 DIAGNOSIS — I10 Essential (primary) hypertension: Secondary | ICD-10-CM

## 2019-12-21 ENCOUNTER — Other Ambulatory Visit: Payer: Self-pay | Admitting: Family Medicine

## 2019-12-22 ENCOUNTER — Other Ambulatory Visit: Payer: Self-pay | Admitting: Family Medicine

## 2019-12-22 DIAGNOSIS — I10 Essential (primary) hypertension: Secondary | ICD-10-CM

## 2019-12-24 ENCOUNTER — Other Ambulatory Visit: Payer: Self-pay | Admitting: Family Medicine

## 2020-02-16 DIAGNOSIS — I10 Essential (primary) hypertension: Secondary | ICD-10-CM | POA: Diagnosis not present

## 2020-02-16 DIAGNOSIS — N184 Chronic kidney disease, stage 4 (severe): Secondary | ICD-10-CM | POA: Diagnosis not present

## 2020-02-16 DIAGNOSIS — I701 Atherosclerosis of renal artery: Secondary | ICD-10-CM | POA: Diagnosis not present

## 2020-02-16 DIAGNOSIS — R809 Proteinuria, unspecified: Secondary | ICD-10-CM | POA: Diagnosis not present

## 2020-02-16 DIAGNOSIS — E1122 Type 2 diabetes mellitus with diabetic chronic kidney disease: Secondary | ICD-10-CM | POA: Diagnosis not present

## 2020-02-18 ENCOUNTER — Other Ambulatory Visit: Payer: Self-pay | Admitting: Family Medicine

## 2020-02-18 MED ORDER — SIMVASTATIN 20 MG PO TABS: 20.0000 mg | ORAL_TABLET | Freq: Every day | ORAL | 0 refills | Status: DC

## 2020-02-18 NOTE — Telephone Encounter (Signed)
Medication Refill - Medication: simvastatin (ZOCOR) 20 MG tablet    Has the patient contacted their pharmacy? Yes.  contact PCP.   Preferred Pharmacy (with phone number or street name):   [facility] [place], [place] Phone:  [phone]  Fax:  [phone]        Agent: Please be advised that RX refills may take up to 3 business days. We ask that you follow-up with your pharmacy.

## 2020-02-21 DIAGNOSIS — R0602 Shortness of breath: Secondary | ICD-10-CM | POA: Diagnosis not present

## 2020-02-21 DIAGNOSIS — E782 Mixed hyperlipidemia: Secondary | ICD-10-CM | POA: Diagnosis not present

## 2020-02-21 DIAGNOSIS — I2510 Atherosclerotic heart disease of native coronary artery without angina pectoris: Secondary | ICD-10-CM | POA: Diagnosis not present

## 2020-02-21 DIAGNOSIS — G4730 Sleep apnea, unspecified: Secondary | ICD-10-CM | POA: Diagnosis not present

## 2020-02-21 DIAGNOSIS — I10 Essential (primary) hypertension: Secondary | ICD-10-CM | POA: Diagnosis not present

## 2020-02-22 ENCOUNTER — Other Ambulatory Visit: Payer: Self-pay | Admitting: Family Medicine

## 2020-02-23 DIAGNOSIS — I10 Essential (primary) hypertension: Secondary | ICD-10-CM | POA: Diagnosis not present

## 2020-02-23 DIAGNOSIS — E1122 Type 2 diabetes mellitus with diabetic chronic kidney disease: Secondary | ICD-10-CM | POA: Diagnosis not present

## 2020-02-23 DIAGNOSIS — N184 Chronic kidney disease, stage 4 (severe): Secondary | ICD-10-CM | POA: Diagnosis not present

## 2020-02-23 DIAGNOSIS — R809 Proteinuria, unspecified: Secondary | ICD-10-CM | POA: Diagnosis not present

## 2020-03-01 ENCOUNTER — Ambulatory Visit (INDEPENDENT_AMBULATORY_CARE_PROVIDER_SITE_OTHER): Payer: Medicare Other | Admitting: Family Medicine

## 2020-03-01 ENCOUNTER — Other Ambulatory Visit: Payer: Self-pay

## 2020-03-01 ENCOUNTER — Encounter: Payer: Self-pay | Admitting: Family Medicine

## 2020-03-01 VITALS — BP 153/70 | HR 72 | Temp 98.0°F | Resp 20 | Ht 67.0 in | Wt 213.0 lb

## 2020-03-01 DIAGNOSIS — Z6833 Body mass index (BMI) 33.0-33.9, adult: Secondary | ICD-10-CM

## 2020-03-01 DIAGNOSIS — N1831 Chronic kidney disease, stage 3a: Secondary | ICD-10-CM | POA: Diagnosis not present

## 2020-03-01 DIAGNOSIS — E782 Mixed hyperlipidemia: Secondary | ICD-10-CM | POA: Diagnosis not present

## 2020-03-01 DIAGNOSIS — E1122 Type 2 diabetes mellitus with diabetic chronic kidney disease: Secondary | ICD-10-CM | POA: Diagnosis not present

## 2020-03-01 DIAGNOSIS — I10 Essential (primary) hypertension: Secondary | ICD-10-CM

## 2020-03-01 DIAGNOSIS — G4733 Obstructive sleep apnea (adult) (pediatric): Secondary | ICD-10-CM

## 2020-03-01 DIAGNOSIS — E6609 Other obesity due to excess calories: Secondary | ICD-10-CM

## 2020-03-01 DIAGNOSIS — Z23 Encounter for immunization: Secondary | ICD-10-CM

## 2020-03-01 LAB — POCT GLYCOSYLATED HEMOGLOBIN (HGB A1C): Hemoglobin A1C: 9 % — AB (ref 4.0–5.6)

## 2020-03-01 MED ORDER — HYDRALAZINE HCL 100 MG PO TABS: 100.0000 mg | ORAL_TABLET | Freq: Two times a day (BID) | ORAL | 5 refills | Status: DC

## 2020-03-01 NOTE — Progress Notes (Signed)
Established patient visit   Patient: Randy Dalton   DOB: 1927/06/13   87 y.o. Male  MRN: [id number] Visit Date: 03/01/2020  Today's healthcare provider: [name], MD   Chief Complaint  Patient presents with  . Diabetes  . Hypertension   Subjective    HPI  She comes in today for follow-up.  Reluctant to make any changes.  Is well and has no complaints. Diabetes Mellitus Type II, follow-up  Lab Results  Component Value Date   HGBA1C 9.0 (A) 03/01/2020   HGBA1C 9.5 (A) 11/09/2019   HGBA1C 8.4 (H) 06/24/2019   Last seen for diabetes 3 months ago.  Management since then includes; A1c has gone from 8.4-9.4. This 87 year old will be less than 8.5.  He is willing to work on his eating habits. He is on Tradjenta and glimepiride.  Consider doubling glimepiride dose. Would reluctantly do this because of the risk of hypoglycemia in this 87 year old.  Not really check his blood sugars at home.  Follow-up 3 months.  Have asked him to check his fasting blood sugars in the morning. He reports good compliance with treatment. He is not having side effects.   Home blood sugar records: fasting range: 200s  Episodes of hypoglycemia? No    Current insulin regiment: none Most Recent Eye Exam: due  --------------------------------------------------------------------------------------------------- Hypertension, follow-up  BP Readings from Last 3 Encounters:  03/01/20 (!) 153/70  11/09/19 121/62  09/02/19 130/69   Wt Readings from Last 3 Encounters:  03/01/20 213 lb (96.6 kg)  11/09/19 210 lb (95.3 kg)  09/02/19 206 lb (93.4 kg)     He was last seen for hypertension 3 months ago.  BP at that visit was 121/62. Management since that visit includes; on amlodipine, carvedilol, losartan, and torsemide. He reports good compliance with treatment. He is not having side effects.  He is not exercising. He is adherent to low salt diet.   Outside blood pressures are checked  occasionally.  He does not smoke.  Use of agents associated with hypertension: none.       Medications: Outpatient Medications Prior to Visit  Medication Sig  . amLODipine (NORVASC) 10 MG tablet TAKE 1 TABLET BY MOUTH DAILY  . carvedilol (COREG) 12.5 MG tablet TAKE 1 TABLET BY MOUTH TWICE DAILY  . Cholecalciferol (VITAMIN D) 2000 UNITS tablet Take 2,000 Units by mouth daily.   Marland Kitchen glimepiride (AMARYL) 4 MG tablet TAKE 1 TABLET BY MOUTH DAILY  . hydrALAZINE (APRESOLINE) 50 MG tablet TAKE 1 TABLET BY MOUTH TWICE DAILY  . levothyroxine (SYNTHROID) 175 MCG tablet TAKE 1 TABLET(175 MCG) BY MOUTH DAILY  . losartan (COZAAR) 100 MG tablet TAKE 1 TABLET BY MOUTH EVERY DAY  . meloxicam (MOBIC) 7.5 MG tablet Take 1 tablet (7.5 mg total) by mouth 2 (two) times daily.  . Multiple Vitamin (MULTIVITAMIN) tablet Take 1 tablet by mouth daily.  . potassium chloride SA (K-DUR,KLOR-CON) 20 MEQ tablet Take 1 tablet by mouth every other day.   . sildenafil (VIAGRA) 100 MG tablet Take 100 mg by mouth as needed.   . simvastatin (ZOCOR) 20 MG tablet Take 1 tablet (20 mg total) by mouth at bedtime.  . torsemide (DEMADEX) 20 MG tablet Take 20 mg by mouth daily.  . TRADJENTA 5 MG TABS tablet TAKE 1 TABLET BY MOUTH EVERY DAY   No facility-administered medications prior to visit.    Review of Systems  Constitutional: Negative for appetite change, chills and fever.  Respiratory:  Negative for chest tightness, shortness of breath and wheezing.   Cardiovascular: Negative for chest pain and palpitations.  Gastrointestinal: Negative for abdominal pain, nausea and vomiting.    Last hemoglobin A1c Lab Results  Component Value Date   HGBA1C 9.0 (A) 03/01/2020       Objective    BP (!) 153/70   Pulse 72   Temp 98 F (36.7 C)   Resp 20   Ht '5\' 7"'$  (1.702 m)   Wt 213 lb (96.6 kg)   BMI 33.36 kg/m  BP Readings from Last 3 Encounters:  03/01/20 (!) 153/70  11/09/19 121/62  09/02/19 130/69   Wt Readings  from Last 3 Encounters:  03/01/20 213 lb (96.6 kg)  11/09/19 210 lb (95.3 kg)  09/02/19 206 lb (93.4 kg)       Physical Exam Vitals and nursing note reviewed.  Constitutional:      Appearance: Normal appearance. He is normal weight.  HENT:     Right Ear: Tympanic membrane normal.     Left Ear: Tympanic membrane normal.     Nose: Nose normal.     Mouth/Throat:     Mouth: Mucous membranes are moist.  Cardiovascular:     Rate and Rhythm: Normal rate and regular rhythm.     Pulses: Normal pulses.     Heart sounds: Normal heart sounds.  Pulmonary:     Effort: Pulmonary effort is normal.     Breath sounds: Normal breath sounds.  Abdominal:     General: Bowel sounds are normal.     Palpations: Abdomen is soft.  Musculoskeletal:     Cervical back: Normal range of motion and neck supple.  Skin:    General: Skin is warm and dry.  Neurological:     General: No focal deficit present.     Mental Status: He is alert and oriented to person, place, and time.  Psychiatric:        Mood and Affect: Mood normal.        Behavior: Behavior normal.        Thought Content: Thought content normal.        Judgment: Judgment normal.       Results for orders placed or performed in visit on 03/01/20  POCT glycosylated hemoglobin (Hb A1C)  Result Value Ref Range   Hemoglobin A1C 9.0 (A) 4.0 - 5.6 %   HbA1c POC (<> result, manual entry)     HbA1c, POC (prediabetic range)     HbA1c, POC (controlled diabetic range)      Assessment & Plan     1. Type 2 diabetes mellitus with stage 3a chronic kidney disease, without long-term current use of insulin ([place]) Once he has gone from 9.5 down to 9.  Goal A1c in this 87 year old will be 8.5-9 or below.  Courage patient to continue to work on diet and exercise. - POCT glycosylated hemoglobin (Hb A1C)  2. Need for shingles vaccine  - Varicella-zoster vaccine IM (Shingrix)  3. Essential (primary) hypertension Pres hydralazine from 50 to 100 mg twice  daily. - hydrALAZINE (APRESOLINE) 100 MG tablet; Take 1 tablet (100 mg total) by mouth 2 (two) times daily.  Dispense: 60 tablet; Refill: 5  4. Obstructive sleep apnea syndrome   5. Class 1 obesity due to excess calories with serious comorbidity and body mass index (BMI) of 33.0 to 33.9 in adult Diabetes hypertension hyperlipidemia  6. Mixed hyperlipidemia Patient has tolerated simvastatin for years.   No follow-ups on  file.      I, [name], MD, have reviewed all documentation for this visit. The documentation on 03/05/20 for the exam, diagnosis, procedures, and orders are all accurate and complete.     Cranford Mon, MD  [facility] [phone] (phone) [phone] (fax)  [place]

## 2020-03-06 ENCOUNTER — Other Ambulatory Visit: Payer: Self-pay | Admitting: Family Medicine

## 2020-03-06 MED ORDER — SIMVASTATIN 20 MG PO TABS: 20.0000 mg | ORAL_TABLET | Freq: Every day | ORAL | 0 refills | Status: DC

## 2020-03-06 NOTE — Telephone Encounter (Signed)
Medication Refill - Medication: simvastatin (ZOCOR) 20 MG tablet     Preferred Pharmacy (with phone number or street name):  [facility] [id number], [place] Phone:  [phone]  Fax:  [phone]       Agent: Please be advised that RX refills may take up to 3 business days. We ask that you follow-up with your pharmacy.

## 2020-03-12 ENCOUNTER — Other Ambulatory Visit: Payer: Self-pay | Admitting: Family Medicine

## 2020-03-12 DIAGNOSIS — I10 Essential (primary) hypertension: Secondary | ICD-10-CM

## 2020-03-12 NOTE — Telephone Encounter (Signed)
Requested Prescriptions  Pending Prescriptions Disp Refills  . amLODipine (NORVASC) 10 MG tablet [Pharmacy Med Name: AMLODIPINE BESYLATE 10 MG TAB] 90 tablet 0    Sig: TAKE 1 TABLET BY MOUTH EVERY DAY     Cardiovascular:  Calcium Channel Blockers Failed - 03/12/2020 12:51 AM      Failed - Last BP in normal range    BP Readings from Last 1 Encounters:  03/01/20 (!) 153/70         Passed - Valid encounter within last 6 months    Recent Outpatient Visits          1 week ago Type 2 diabetes mellitus with stage 3a chronic kidney disease, without long-term current use of insulin ([place])   [facility] Jerrol Banana., MD   4 months ago Type 2 diabetes mellitus with stage 3a chronic kidney disease, without long-term current use of insulin ([place])   [facility] Jerrol Banana., MD   6 months ago Essential (primary) hypertension   [facility] Jerrol Banana., MD   8 months ago Type 2 diabetes mellitus with stage 3a chronic kidney disease, without long-term current use of insulin [facility])   [facility] Jerrol Banana., MD   1 year ago Type 2 diabetes mellitus with stage 3a chronic kidney disease, without long-term current use of insulin [facility])   [facility] Jerrol Banana., MD      Future Appointments            In 3 months Jerrol Banana., MD [facility]

## 2020-03-13 ENCOUNTER — Other Ambulatory Visit: Payer: Self-pay | Admitting: Family Medicine

## 2020-03-13 NOTE — Telephone Encounter (Signed)
Requested Prescriptions  Pending Prescriptions Disp Refills  . TRADJENTA 5 MG TABS tablet [Pharmacy Med Name: TRADJENTA 5 MG TABLET] 90 tablet 0    Sig: TAKE 1 TABLET BY MOUTH EVERY DAY     Endocrinology:  Diabetes - DPP-4 Inhibitors - linagliptin Failed - 03/13/2020  1:25 AM      Failed - HBA1C is between 0 and 7.9 and within 180 days    Hemoglobin A1C  Date Value Ref Range Status  03/01/2020 9.0 (A) 4.0 - 5.6 % Final   Hgb A1c MFr Bld  Date Value Ref Range Status  06/24/2019 8.4 (H) 4.8 - 5.6 % Final    Comment:             Prediabetes: 5.7 - 6.4          Diabetes: >6.4          Glycemic control for adults with diabetes: <7.0          Passed - Valid encounter within last 6 months    Recent Outpatient Visits          1 week ago Type 2 diabetes mellitus with stage 3a chronic kidney disease, without long-term current use of insulin ([place])   [facility] Jerrol Banana., MD   4 months ago Type 2 diabetes mellitus with stage 3a chronic kidney disease, without long-term current use of insulin ([place])   [facility] Jerrol Banana., MD   6 months ago Essential (primary) hypertension   [facility] Jerrol Banana., MD   8 months ago Type 2 diabetes mellitus with stage 3a chronic kidney disease, without long-term current use of insulin [facility])   [facility] Jerrol Banana., MD   1 year ago Type 2 diabetes mellitus with stage 3a chronic kidney disease, without long-term current use of insulin [facility])   [facility] Jerrol Banana., MD      Future Appointments            In 3 months Jerrol Banana., MD [facility] [place] [facility]

## 2020-03-27 ENCOUNTER — Other Ambulatory Visit: Payer: Self-pay | Admitting: Family Medicine

## 2020-03-27 DIAGNOSIS — I10 Essential (primary) hypertension: Secondary | ICD-10-CM

## 2020-03-27 MED ORDER — AMLODIPINE BESYLATE 10 MG PO TABS: ORAL_TABLET | ORAL | 0 refills | Status: DC

## 2020-03-27 NOTE — Telephone Encounter (Signed)
Pts insurance has changed the phrarmacy to [facility] / Pt needs his refill from 3.6.22 of amLODipine (NORVASC) 10 MG tablet  To be transferred to  [place], [address] [place] AT [place] Phone:  [phone]  Fax:  [phone]     Please advise when sent

## 2020-04-12 ENCOUNTER — Other Ambulatory Visit: Payer: Self-pay | Admitting: Family Medicine

## 2020-04-14 ENCOUNTER — Other Ambulatory Visit: Payer: Self-pay | Admitting: Family Medicine

## 2020-06-07 ENCOUNTER — Other Ambulatory Visit: Payer: Self-pay | Admitting: Family Medicine

## 2020-06-07 MED ORDER — SIMVASTATIN 20 MG PO TABS: 20.0000 mg | ORAL_TABLET | Freq: Every day | ORAL | 0 refills | Status: DC

## 2020-06-07 NOTE — Telephone Encounter (Signed)
Copied from [place] [id number]. Topic: Quick Communication - Rx Refill/Question >> Jun 07, 2020 11:21 AM Mcneil, Ja-Kwan wrote: Medication: simvastatin (ZOCOR) 20 MG tablet  Has the patient contacted their pharmacy? yes - Pt told to contact provider  Preferred Pharmacy (with phone number or street name): [facility] [place], [address] Phone: [phone]   Fax: [phone]  Agent: Please be advised that RX refills may take up to 3 business days. We ask that you follow-up with your pharmacy.

## 2020-06-08 DIAGNOSIS — E119 Type 2 diabetes mellitus without complications: Secondary | ICD-10-CM | POA: Diagnosis not present

## 2020-06-08 LAB — HM DIABETES EYE EXAM

## 2020-06-13 ENCOUNTER — Encounter: Payer: Self-pay | Admitting: Family Medicine

## 2020-06-13 ENCOUNTER — Ambulatory Visit (INDEPENDENT_AMBULATORY_CARE_PROVIDER_SITE_OTHER): Payer: Medicare Other | Admitting: Family Medicine

## 2020-06-13 ENCOUNTER — Other Ambulatory Visit: Payer: Self-pay

## 2020-06-13 VITALS — BP 139/60 | HR 53 | Temp 98.1°F | Resp 15 | Wt 204.8 lb

## 2020-06-13 DIAGNOSIS — E6609 Other obesity due to excess calories: Secondary | ICD-10-CM | POA: Diagnosis not present

## 2020-06-13 DIAGNOSIS — Z6833 Body mass index (BMI) 33.0-33.9, adult: Secondary | ICD-10-CM | POA: Diagnosis not present

## 2020-06-13 DIAGNOSIS — E1122 Type 2 diabetes mellitus with diabetic chronic kidney disease: Secondary | ICD-10-CM | POA: Diagnosis not present

## 2020-06-13 DIAGNOSIS — N189 Chronic kidney disease, unspecified: Secondary | ICD-10-CM

## 2020-06-13 DIAGNOSIS — N1831 Chronic kidney disease, stage 3a: Secondary | ICD-10-CM

## 2020-06-13 DIAGNOSIS — M1990 Unspecified osteoarthritis, unspecified site: Secondary | ICD-10-CM | POA: Diagnosis not present

## 2020-06-13 DIAGNOSIS — E782 Mixed hyperlipidemia: Secondary | ICD-10-CM

## 2020-06-13 DIAGNOSIS — I10 Essential (primary) hypertension: Secondary | ICD-10-CM

## 2020-06-13 DIAGNOSIS — E039 Hypothyroidism, unspecified: Secondary | ICD-10-CM

## 2020-06-13 LAB — POCT GLYCOSYLATED HEMOGLOBIN (HGB A1C): Hemoglobin A1C: 7.6 % — AB (ref 4.0–5.6)

## 2020-06-13 NOTE — Patient Instructions (Signed)
ONLY TAKE MELOXICAM FOR SIGNIFICANT PAIN   Managing Your Hypertension Hypertension, also called high blood pressure, is when the force of the blood pressing against the walls of the arteries is too strong. Arteries are blood vessels that carry blood from your heart throughout your body. Hypertension forces the heart to work harder to pump blood and may cause the arteries to become narrow or stiff. Understanding blood pressure readings Your personal target blood pressure may vary depending on your medical conditions, your age, and other factors. A blood pressure reading includes a higher number over a lower number. Ideally, your blood pressure should be below 120/80. You should know that:  The first, or top, number is called the systolic pressure. It is a measure of the pressure in your arteries as your heart beats.  The second, or bottom number, is called the diastolic pressure. It is a measure of the pressure in your arteries as the heart relaxes. Blood pressure is classified into four stages. Based on your blood pressure reading, your health care provider may use the following stages to determine what type of treatment you need, if any. Systolic pressure and diastolic pressure are measured in a unit called mmHg. Normal  Systolic pressure: below 123456.  Diastolic pressure: below 80. Elevated  Systolic pressure: [id number].  Diastolic pressure: below 80. Hypertension stage 1  Systolic pressure: 0000000.  Diastolic pressure: XX123456. Hypertension stage 2  Systolic pressure: XX123456 or above.  Diastolic pressure: 90 or above. How can this condition affect me? Managing your hypertension is an important responsibility. Over time, hypertension can damage the arteries and decrease blood flow to important parts of the body, including the brain, heart, and kidneys. Having untreated or uncontrolled hypertension can lead to:  A heart attack.  A stroke.  A weakened blood vessel (aneurysm).  Heart  failure.  Kidney damage.  Eye damage.  Metabolic syndrome.  Memory and concentration problems.  Vascular dementia. What actions can I take to manage this condition? Hypertension can be managed by making lifestyle changes and possibly by taking medicines. Your health care provider will help you make a plan to bring your blood pressure within a normal range. Nutrition  Eat a diet that is high in fiber and potassium, and low in salt (sodium), added sugar, and fat. An example eating plan is called the Dietary Approaches to Stop Hypertension (DASH) diet. To eat this way: ? Eat plenty of fresh fruits and vegetables. Try to fill one-half of your plate at each meal with fruits and vegetables. ? Eat whole grains, such as whole-wheat pasta, brown rice, or whole-grain bread. Fill about one-fourth of your plate with whole grains. ? Eat low-fat dairy products. ? Avoid fatty cuts of meat, processed or cured meats, and poultry with skin. Fill about one-fourth of your plate with lean proteins such as fish, chicken without skin, beans, eggs, and tofu. ? Avoid pre-made and processed foods. These tend to be higher in sodium, added sugar, and fat.  Reduce your daily sodium intake. Most people with hypertension should eat less than 1,500 mg of sodium a day.   Lifestyle  Work with your health care provider to maintain a healthy body weight or to lose weight. Ask what an ideal weight is for you.  Get at least 30 minutes of exercise that causes your heart to beat faster (aerobic exercise) most days of the week. Activities may include walking, swimming, or biking.  Include exercise to strengthen your muscles (resistance exercise), such as weight lifting,  as part of your weekly exercise routine. Try to do these types of exercises for 30 minutes at least 3 days a week.  Do not use any products that contain nicotine or tobacco, such as cigarettes, e-cigarettes, and chewing tobacco. If you need help quitting, ask  your health care provider.  Control any long-term (chronic) conditions you have, such as high cholesterol or diabetes.  Identify your sources of stress and find ways to manage stress. This may include meditation, deep breathing, or making time for fun activities.   Alcohol use  Do not drink alcohol if: ? Your health care provider tells you not to drink. ? You are pregnant, may be pregnant, or are planning to become pregnant.  If you drink alcohol: ? Limit how much you use to:  0-1 drink a day for women.  0-2 drinks a day for men. ? Be aware of how much alcohol is in your drink. In the [place] one drink equals one 12 oz bottle of beer (355 mL), one 5 oz glass of wine (148 mL), or one 1 oz glass of hard liquor (44 mL). Medicines Your health care provider may prescribe medicine if lifestyle changes are not enough to get your blood pressure under control and if:  Your systolic blood pressure is 130 or higher.  Your diastolic blood pressure is 80 or higher. Take medicines only as told by your health care provider. Follow the directions carefully. Blood pressure medicines must be taken as told by your health care provider. The medicine does not work as well when you skip doses. Skipping doses also puts you at risk for problems. Monitoring Before you monitor your blood pressure:  Do not smoke, drink caffeinated beverages, or exercise within 30 minutes before taking a measurement.  Use the bathroom and empty your bladder (urinate).  Sit quietly for at least 5 minutes before taking measurements. Monitor your blood pressure at home as told by your health care provider. To do this:  Sit with your back straight and supported.  Place your feet flat on the floor. Do not cross your legs.  Support your arm on a flat surface, such as a table. Make sure your upper arm is at heart level.  Each time you measure, take two or three readings one minute apart and record the results. You may also  need to have your blood pressure checked regularly by your health care provider.   General information  Talk with your health care provider about your diet, exercise habits, and other lifestyle factors that may be contributing to hypertension.  Review all the medicines you take with your health care provider because there may be side effects or interactions.  Keep all visits as told by your health care provider. Your health care provider can help you create and adjust your plan for managing your high blood pressure. Where to find more information  National Heart, Lung, and Blood Institute: https://wilson-eaton.com/  American Heart Association: www.heart.org Contact a health care provider if:  You think you are having a reaction to medicines you have taken.  You have repeated (recurrent) headaches.  You feel dizzy.  You have swelling in your ankles.  You have trouble with your vision. Get help right away if:  You develop a severe headache or confusion.  You have unusual weakness or numbness, or you feel faint.  You have severe pain in your chest or abdomen.  You vomit repeatedly.  You have trouble breathing. These symptoms may represent a serious  problem that is an emergency. Do not wait to see if the symptoms will go away. Get medical help right away. Call your local emergency services (911 in the [place] Do not drive yourself to the hospital. Summary  Hypertension is when the force of blood pumping through your arteries is too strong. If this condition is not controlled, it may put you at risk for serious complications.  Your personal target blood pressure may vary depending on your medical conditions, your age, and other factors. For most people, a normal blood pressure is less than 120/80.  Hypertension is managed by lifestyle changes, medicines, or both.  Lifestyle changes to help manage hypertension include losing weight, eating a healthy, low-sodium diet, exercising more,  stopping smoking, and limiting alcohol. This information is not intended to replace advice given to you by your health care provider. Make sure you discuss any questions you have with your health care provider. Document Revised: 01/29/2019 Document Reviewed: 11/24/2018 Elsevier Patient Education  2021 [place].   Preventing High Cholesterol Cholesterol is a white, waxy substance similar to fat that the human body needs to help build cells. The liver makes all the cholesterol that a person's body needs. Having high cholesterol (hypercholesterolemia) increases your risk for heart disease and stroke. Extra or excess cholesterol comes from the food that you eat. High cholesterol can often be prevented with diet and lifestyle changes. If you already have high cholesterol, you can control it with diet, lifestyle changes, and medicines. How can high cholesterol affect me? If you have high cholesterol, fatty deposits (plaques) may build up on the walls of your blood vessels. The blood vessels that carry blood away from your heart are called arteries. Plaques make the arteries narrower and stiffer. This in turn can:  Restrict or block blood flow and cause blood clots to form.  Increase your risk for heart attack and stroke. What can increase my risk for high cholesterol? This condition is more likely to develop in people who:  Eat foods that are high in saturated fat or cholesterol. Saturated fat is mostly found in foods that come from animal sources.  Are overweight.  Are not getting enough exercise.  Have a family history of high cholesterol (familial hypercholesterolemia). What actions can I take to prevent this? Nutrition  Eat less saturated fat.  Avoid trans fats (partially hydrogenated oils). These are often found in margarine and in some baked goods, fried foods, and snacks bought in packages.  Avoid precooked or cured meat, such as bacon, sausages, or meat loaves.  Avoid foods  and drinks that have added sugars.  Eat more fruits, vegetables, and whole grains.  Choose healthy sources of protein, such as fish, poultry, lean cuts of red meat, beans, peas, lentils, and nuts.  Choose healthy sources of fat, such as: ? Nuts. ? Vegetable oils, especially olive oil. ? Fish that have healthy fats, such as omega-3 fatty acids. These fish include mackerel or salmon.   Lifestyle  Lose weight if you are overweight. Maintaining a healthy body mass index (BMI) can help prevent or control high cholesterol. It can also lower your risk for diabetes and high blood pressure. Ask your health care provider to help you with a diet and exercise plan to lose weight safely.  Do not use any products that contain nicotine or tobacco, such as cigarettes, e-cigarettes, and chewing tobacco. If you need help quitting, ask your health care provider. Alcohol use  Do not drink alcohol if: ? Your  health care provider tells you not to drink. ? You are pregnant, may be pregnant, or are planning to become pregnant.  If you drink alcohol: ? Limit how much you use to:  0-1 drink a day for women.  0-2 drinks a day for men. ? Be aware of how much alcohol is in your drink. In the [place] one drink equals one 12 oz bottle of beer (355 mL), one 5 oz glass of wine (148 mL), or one 1 oz glass of hard liquor (44 mL). Activity  Get enough exercise. Do exercises as told by your health care provider.  Each week, do at least 150 minutes of exercise that takes a medium level of effort (moderate-intensity exercise). This kind of exercise: ? Makes your heart beat faster while allowing you to still be able to talk. ? Can be done in short sessions several times a day or longer sessions a few times a week. For example, on 5 days each week, you could walk fast or ride your bike 3 times a day for 10 minutes each time.   Medicines  Your health care provider may recommend medicines to help lower cholesterol. This  may be a medicine to lower the amount of cholesterol that your liver makes. You may need medicine if: ? Diet and lifestyle changes have not lowered your cholesterol enough. ? You have high cholesterol and other risk factors for heart disease or stroke.  Take over-the-counter and prescription medicines only as told by your health care provider. General information  Manage your risk factors for high cholesterol. Talk with your health care provider about all your risk factors and how to lower your risk.  Manage other conditions that you have, such as diabetes or high blood pressure (hypertension).  Have blood tests to check your cholesterol levels at regular points in time as told by your health care provider.  Keep all follow-up visits as told by your health care provider. This is important. Where to find more information  American Heart Association: www.heart.org  National Heart, Lung, and Blood Institute: https://wilson-eaton.com/ Summary  High cholesterol increases your risk for heart disease and stroke. By keeping your cholesterol level low, you can reduce your risk for these conditions.  High cholesterol can often be prevented with diet and lifestyle changes.  Work with your health care provider to manage your risk factors, and have your blood tested regularly. This information is not intended to replace advice given to you by your health care provider. Make sure you discuss any questions you have with your health care provider. Document Revised: 10/06/2018 Document Reviewed: 10/06/2018 Elsevier Patient Education  [place].

## 2020-06-13 NOTE — Progress Notes (Signed)
Established patient visit   Patient: Randy Dalton   DOB: [birth year]   87 y.o. Male  MRN: [id number] Visit Date: 06/13/2020  Today's healthcare provider: [name], MD   Chief Complaint  Patient presents with   Diabetes   Hypertension   Hyperlipidemia   Subjective    HPI  She comes in today for follow-up.  87 year old continues to do well.  Frequently he can take meloxicam for arthritic pain.  He has no complaints today.  He is taking his medications. Diabetes Mellitus Type II, follow-up  Lab Results  Component Value Date   HGBA1C 7.6 (A) 06/13/2020   HGBA1C 9.0 (A) 03/01/2020   HGBA1C 9.5 (A) 11/09/2019   Last seen for diabetes 4 months ago.       Management since then includes;  Once he has gone from 9.5 down to 9.  Goal A1c in this 87 year old will be 8.5-9 or below.  Courage patient to continue to work on diet and exercise. He reports excellent compliance with treatment.  Home blood sugar records:  being checked on occasion  Episodes of hypoglycemia? No    Current insulin regiment: none  Most Recent Eye Exam: 05/07/17 ( patient reports that he had eye exam 1 week ago)  --------------------------------------------------------------------------------------------------- Hypertension, follow-up  BP Readings from Last 3 Encounters:  06/13/20 139/60  03/01/20 (!) 153/70  11/09/19 121/62   Wt Readings from Last 3 Encounters:  06/13/20 204 lb 12.8 oz (92.9 kg)  03/01/20 213 lb (96.6 kg)  11/09/19 210 lb (95.3 kg)     He was last seen for hypertension 4 months ago.  BP at that visit was 153/70.     Management since that visit includes; Changed hydralazine from 50 to 100 mg twice daily. He reports excellent compliance with treatment. He is not having side effects.  He is exercising. He is adherent to low salt diet.   Outside blood pressures are systolic ranging [id number], diastolic 0000000.  He does not smoke.  Use of agents associated with  hypertension: none.   --------------------------------------------------------------------------------------------------- Lipid/Cholesterol, follow-up  Last Lipid Panel: Lab Results  Component Value Date   CHOL 139 06/24/2019   LDLCALC 79 06/24/2019   HDL 36 (L) 06/24/2019   TRIG 136 06/24/2019    He was last seen for this 1 years ago.  Management since that visit includes; on simvastatin.  He reports excellent compliance with treatment. He is not having side effects.  He is following a Regular diet. Current exercise: walking  Last metabolic panel Lab Results  Component Value Date   GLUCOSE 249 (H) 09/02/2019   NA 138 09/02/2019   K 4.5 09/02/2019   BUN 37 (H) 09/02/2019   CREATININE 2.10 (H) 09/02/2019   GFRNONAA 27 (L) 09/02/2019   GFRAA 31 (L) 09/02/2019   CALCIUM 10.2 09/02/2019   AST 17 06/24/2019   ALT 12 06/24/2019   The ASCVD Risk score (Goff DC Jr., et al., 2013) failed to calculate for the following reasons:   The 2013 ASCVD risk score is only valid for ages 56 to 69  ---------------------------------------------------------------------------------------------------   Patient Active Problem List   Diagnosis Date Noted   Chronic diastolic heart failure ([place]) 02/07/2017   Atherosclerosis of renal artery ([place]) 05/17/2014   ED (erectile dysfunction) of organic origin 05/17/2014   Essential (primary) hypertension 05/17/2014   HLD (hyperlipidemia) 05/17/2014   Adult hypothyroidism 05/17/2014   Adiposity 05/17/2014   Arthritis, degenerative 05/17/2014   Apnea, sleep  05/17/2014   Diabetes mellitus, type 2 ([place]) 05/17/2014   Avitaminosis D 05/17/2014   Past Medical History:  Diagnosis Date   CHF (congestive heart failure) (HCC)    Chronic kidney disease    Diabetes mellitus without complication (HCC)    Diverticulosis    GERD (gastroesophageal reflux disease)    Hypercholesteremia    Hypertension    Hypothyroidism    Obstructive sleep apnea    CPAP    Wears dentures    partial top   Allergies  Allergen Reactions   Aspirin Other (See Comments)    Feel bad Feel bad   Codeine Nausea Only       Medications: Outpatient Medications Prior to Visit  Medication Sig   amLODipine (NORVASC) 10 MG tablet TAKE 1 TABLET BY MOUTH EVERY DAY   carvedilol (COREG) 12.5 MG tablet TAKE 1 TABLET BY MOUTH TWICE DAILY   Cholecalciferol (VITAMIN D) 2000 UNITS tablet Take 2,000 Units by mouth daily.    glimepiride (AMARYL) 4 MG tablet TAKE 1 TABLET BY MOUTH EVERY DAY   hydrALAZINE (APRESOLINE) 100 MG tablet Take 1 tablet (100 mg total) by mouth 2 (two) times daily.   levothyroxine (SYNTHROID) 175 MCG tablet TAKE 1 TABLET(175 MCG) BY MOUTH DAILY   losartan (COZAAR) 100 MG tablet TAKE 1 TABLET BY MOUTH EVERY DAY   meloxicam (MOBIC) 7.5 MG tablet Take 1 tablet (7.5 mg total) by mouth 2 (two) times daily.   Multiple Vitamin (MULTIVITAMIN) tablet Take 1 tablet by mouth daily.   potassium chloride SA (K-DUR,KLOR-CON) 20 MEQ tablet Take 1 tablet by mouth every other day.    sildenafil (VIAGRA) 100 MG tablet Take 100 mg by mouth as needed.    simvastatin (ZOCOR) 20 MG tablet Take 1 tablet (20 mg total) by mouth at bedtime.   torsemide (DEMADEX) 20 MG tablet Take 20 mg by mouth daily.   TRADJENTA 5 MG TABS tablet TAKE 1 TABLET BY MOUTH EVERY DAY   No facility-administered medications prior to visit.    Review of Systems  Constitutional: Negative for appetite change, chills and fever.  Respiratory: Negative for chest tightness, shortness of breath and wheezing.   Cardiovascular: Negative for chest pain and palpitations.  Gastrointestinal: Negative for abdominal pain, nausea and vomiting.        Objective    BP 139/60   Pulse (!) 53   Temp 98.1 F (36.7 C) (Oral)   Resp 15   Wt 204 lb 12.8 oz (92.9 kg)   SpO2 100%   BMI 32.08 kg/m  BP Readings from Last 3 Encounters:  06/13/20 139/60  03/01/20 (!) 153/70  11/09/19 121/62   Wt Readings from  Last 3 Encounters:  06/13/20 204 lb 12.8 oz (92.9 kg)  03/01/20 213 lb (96.6 kg)  11/09/19 210 lb (95.3 kg)       Physical Exam Vitals and nursing note reviewed.  Constitutional:      Appearance: Normal appearance. He is normal weight.  HENT:     Right Ear: Tympanic membrane normal.     Left Ear: Tympanic membrane normal.     Nose: Nose normal.     Mouth/Throat:     Mouth: Mucous membranes are moist.  Cardiovascular:     Rate and Rhythm: Normal rate and regular rhythm.     Pulses: Normal pulses.     Heart sounds: Normal heart sounds.  Pulmonary:     Effort: Pulmonary effort is normal.     Breath sounds: Normal breath  sounds.  Abdominal:     General: Bowel sounds are normal.     Palpations: Abdomen is soft.  Musculoskeletal:     Cervical back: Normal range of motion and neck supple.  Skin:    General: Skin is warm and dry.  Neurological:     General: No focal deficit present.     Mental Status: He is alert and oriented to person, place, and time.  Psychiatric:        Mood and Affect: Mood normal.        Behavior: Behavior normal.        Thought Content: Thought content normal.        Judgment: Judgment normal.      Results for orders placed or performed in visit on 06/13/20  POCT glycosylated hemoglobin (Hb A1C)  Result Value Ref Range   Hemoglobin A1C 7.6 (A) 4.0 - 5.6 %   HbA1c POC (<> result, manual entry)     HbA1c, POC (prediabetic range)     HbA1c, POC (controlled diabetic range)      Assessment & Plan     1. Type 2 diabetes mellitus with stage 3a chronic kidney disease, without long-term current use of insulin (HCC) Good control with A1c of 7.6.  We will strongly consider stopping glimepiride in the future - POCT glycosylated hemoglobin (Hb A1C) - Comprehensive metabolic panel  2. Essential (primary) hypertension Excellent control amlodipine 10 and hydralazine losartan and torsemide  3. Mixed hyperlipidemia On simvastatin 20.  Consider changing to  rosuvastatin but patient has tolerated this for 30 years - Lipid panel  4. Chronic kidney disease, unspecified CKD stage For his arthritis told him to only take his meloxicam 1 or 2 doses a week as needed for severe pain - CBC with Differential/Platelet  5. Osteoarthritis, unspecified osteoarthritis type, unspecified site See #4  6. Adult hypothyroidism Stable - TSH  7. Class 1 obesity due to excess calories with serious comorbidity and body mass index (BMI) of 33.0 to 33.9 in adult Try to maintain or lessen body weight   No follow-ups on file.      I, [name], MD, have reviewed all documentation for this visit. The documentation on 06/17/20 for the exam, diagnosis, procedures, and orders are all accurate and complete.    Kierstan Auer Cranford Mon, MD  [facility] [phone] (phone) [phone] (fax)  [place]

## 2020-06-14 LAB — CBC WITH DIFFERENTIAL/PLATELET
Basophils Absolute: 0.1 10*3/uL (ref 0.0–0.2)
Basos: 1 %
EOS (ABSOLUTE): 0.4 10*3/uL (ref 0.0–0.4)
Eos: 4 %
Hematocrit: 44 % (ref 37.5–51.0)
Hemoglobin: 14.4 g/dL (ref 13.0–17.7)
Immature Grans (Abs): 0 10*3/uL (ref 0.0–0.1)
Immature Granulocytes: 0 %
Lymphocytes Absolute: 2 10*3/uL (ref 0.7–3.1)
Lymphs: 20 %
MCH: 31 pg (ref 26.6–33.0)
MCHC: 32.7 g/dL (ref 31.5–35.7)
MCV: 95 fL (ref 79–97)
Monocytes Absolute: 0.9 10*3/uL (ref 0.1–0.9)
Monocytes: 9 %
Neutrophils Absolute: 6.5 10*3/uL (ref 1.4–7.0)
Neutrophils: 66 %
Platelets: 177 10*3/uL (ref 150–450)
RBC: 4.64 x10E6/uL (ref 4.14–5.80)
RDW: 13.2 % (ref 11.6–15.4)
WBC: 9.9 10*3/uL (ref 3.4–10.8)

## 2020-06-14 LAB — TSH: TSH: 3.59 u[IU]/mL (ref 0.450–4.500)

## 2020-06-14 LAB — COMPREHENSIVE METABOLIC PANEL
ALT: 12 IU/L (ref 0–44)
AST: 13 IU/L (ref 0–40)
Albumin/Globulin Ratio: 1.6 (ref 1.2–2.2)
Albumin: 4.2 g/dL (ref 3.5–4.6)
Alkaline Phosphatase: 78 IU/L (ref 44–121)
BUN/Creatinine Ratio: 18 (ref 10–24)
BUN: 34 mg/dL (ref 10–36)
Bilirubin Total: 0.5 mg/dL (ref 0.0–1.2)
CO2: 21 mmol/L (ref 20–29)
Calcium: 10 mg/dL (ref 8.6–10.2)
Chloride: 102 mmol/L (ref 96–106)
Creatinine, Ser: 1.85 mg/dL — ABNORMAL HIGH (ref 0.76–1.27)
Globulin, Total: 2.7 g/dL (ref 1.5–4.5)
Glucose: 208 mg/dL — ABNORMAL HIGH (ref 65–99)
Potassium: 4.7 mmol/L (ref 3.5–5.2)
Sodium: 138 mmol/L (ref 134–144)
Total Protein: 6.9 g/dL (ref 6.0–8.5)
eGFR: 34 mL/min/{1.73_m2} — ABNORMAL LOW (ref >59)

## 2020-06-14 LAB — LIPID PANEL
Chol/HDL Ratio: 3.9 ratio (ref 0.0–5.0)
Cholesterol, Total: 151 mg/dL (ref 100–199)
HDL: 39 mg/dL — ABNORMAL LOW (ref >39)
LDL Chol Calc (NIH): 88 mg/dL (ref 0–99)
Triglycerides: 133 mg/dL (ref 0–149)
VLDL Cholesterol Cal: 24 mg/dL (ref 5–40)

## 2020-07-11 ENCOUNTER — Other Ambulatory Visit: Payer: Self-pay | Admitting: Family Medicine

## 2020-07-19 DIAGNOSIS — E669 Obesity, unspecified: Secondary | ICD-10-CM | POA: Diagnosis not present

## 2020-07-19 DIAGNOSIS — G4730 Sleep apnea, unspecified: Secondary | ICD-10-CM | POA: Diagnosis not present

## 2020-07-19 DIAGNOSIS — I10 Essential (primary) hypertension: Secondary | ICD-10-CM | POA: Diagnosis not present

## 2020-07-19 DIAGNOSIS — I2510 Atherosclerotic heart disease of native coronary artery without angina pectoris: Secondary | ICD-10-CM | POA: Diagnosis not present

## 2020-07-19 DIAGNOSIS — E782 Mixed hyperlipidemia: Secondary | ICD-10-CM | POA: Diagnosis not present

## 2020-07-24 ENCOUNTER — Other Ambulatory Visit: Payer: Self-pay | Admitting: Family Medicine

## 2020-07-24 DIAGNOSIS — I10 Essential (primary) hypertension: Secondary | ICD-10-CM

## 2020-08-06 ENCOUNTER — Other Ambulatory Visit: Payer: Self-pay | Admitting: Family Medicine

## 2020-08-06 NOTE — Telephone Encounter (Signed)
Requested Prescriptions  Pending Prescriptions Disp Refills  . TRADJENTA 5 MG TABS tablet [Pharmacy Med Name: TRADJENTA '5MG'$  TABLETS] 90 tablet 0    Sig: TAKE 1 TABLET BY MOUTH EVERY DAY     Endocrinology:  Diabetes - DPP-4 Inhibitors - linagliptin Passed - 08/06/2020  3:40 PM      Passed - HBA1C is between 0 and 7.9 and within 180 days    Hemoglobin A1C  Date Value Ref Range Status  06/13/2020 7.6 (A) 4.0 - 5.6 % Final   Hgb A1c MFr Bld  Date Value Ref Range Status  06/24/2019 8.4 (H) 4.8 - 5.6 % Final    Comment:             Prediabetes: 5.7 - 6.4          Diabetes: >6.4          Glycemic control for adults with diabetes: <7.0          Passed - Valid encounter within last 6 months    Recent Outpatient Visits          1 month ago Type 2 diabetes mellitus with stage 3a chronic kidney disease, without long-term current use of insulin ([place])   [facility] Jerrol Banana., MD   5 months ago Type 2 diabetes mellitus with stage 3a chronic kidney disease, without long-term current use of insulin ([place])   [facility] Jerrol Banana., MD   9 months ago Type 2 diabetes mellitus with stage 3a chronic kidney disease, without long-term current use of insulin [facility])   [facility] Jerrol Banana., MD   11 months ago Essential (primary) hypertension   [facility] Jerrol Banana., MD   1 year ago Type 2 diabetes mellitus with stage 3a chronic kidney disease, without long-term current use of insulin [facility])   [facility] Jerrol Banana., MD      Future Appointments            In 2 months Jerrol Banana., MD [facility], [place]

## 2020-08-16 DIAGNOSIS — E1122 Type 2 diabetes mellitus with diabetic chronic kidney disease: Secondary | ICD-10-CM | POA: Diagnosis not present

## 2020-08-16 DIAGNOSIS — I10 Essential (primary) hypertension: Secondary | ICD-10-CM | POA: Diagnosis not present

## 2020-08-16 DIAGNOSIS — R82998 Other abnormal findings in urine: Secondary | ICD-10-CM | POA: Diagnosis not present

## 2020-08-16 DIAGNOSIS — N184 Chronic kidney disease, stage 4 (severe): Secondary | ICD-10-CM | POA: Diagnosis not present

## 2020-08-23 DIAGNOSIS — E1122 Type 2 diabetes mellitus with diabetic chronic kidney disease: Secondary | ICD-10-CM | POA: Diagnosis not present

## 2020-08-23 DIAGNOSIS — I509 Heart failure, unspecified: Secondary | ICD-10-CM | POA: Diagnosis not present

## 2020-08-23 DIAGNOSIS — N184 Chronic kidney disease, stage 4 (severe): Secondary | ICD-10-CM | POA: Diagnosis not present

## 2020-08-23 DIAGNOSIS — I701 Atherosclerosis of renal artery: Secondary | ICD-10-CM | POA: Diagnosis not present

## 2020-08-23 DIAGNOSIS — I129 Hypertensive chronic kidney disease with stage 1 through stage 4 chronic kidney disease, or unspecified chronic kidney disease: Secondary | ICD-10-CM | POA: Diagnosis not present

## 2020-08-23 DIAGNOSIS — R809 Proteinuria, unspecified: Secondary | ICD-10-CM | POA: Diagnosis not present

## 2020-08-28 DIAGNOSIS — Z23 Encounter for immunization: Secondary | ICD-10-CM | POA: Diagnosis not present

## 2020-09-09 ENCOUNTER — Ambulatory Visit (INDEPENDENT_AMBULATORY_CARE_PROVIDER_SITE_OTHER): Payer: Medicare Other

## 2020-09-09 ENCOUNTER — Other Ambulatory Visit: Payer: Self-pay

## 2020-09-09 DIAGNOSIS — Z0000 Encounter for general adult medical examination without abnormal findings: Secondary | ICD-10-CM | POA: Diagnosis not present

## 2020-09-09 NOTE — Progress Notes (Addendum)
I connected with  Randy Dalton on 09/09/20 by a video enabled telemedicine application and verified that I am speaking with the correct person using two identifiers.   I discussed the limitations of evaluation and management by telemedicine. The patient expressed understanding and agreed to proceed.    Location of Patient: Home Location of Provider: Office    Subjective:   Randy Dalton is a 87 y.o. male who presents for Medicare Annual/Subsequent preventive examination.  Review of Systems     Cardiac Risk Factors include: diabetes mellitus;male gender     Objective:    There were no vitals filed for this visit. There is no height or weight on file to calculate BMI.  Advanced Directives 06/23/2019 06/22/2018 06/16/2017 03/12/2017 01/28/2017 01/28/2017 05/15/2016  Does Patient Have a Medical Advance Directive? No No No No Yes Yes No  Type of Advance Directive - - - - Press photographer;Living will [place];Living will -  Does patient want to make changes to medical advance directive? - - - - No - Patient declined - -  Would patient like information on creating a medical advance directive? No - Patient declined No - Patient declined No - Patient declined - - - No - Patient declined    Current Medications (verified) Outpatient Encounter Medications as of 09/09/2020  Medication Sig   amLODipine (NORVASC) 10 MG tablet TAKE 1 TABLET BY MOUTH EVERY DAY   carvedilol (COREG) 12.5 MG tablet TAKE 1 TABLET BY MOUTH TWICE DAILY   Cholecalciferol (VITAMIN D) 2000 UNITS tablet Take 2,000 Units by mouth daily.    glimepiride (AMARYL) 4 MG tablet TAKE 1 TABLET BY MOUTH EVERY DAY   hydrALAZINE (APRESOLINE) 100 MG tablet Take 1 tablet (100 mg total) by mouth 2 (two) times daily.   levothyroxine (SYNTHROID) 175 MCG tablet TAKE 1 TABLET(175 MCG) BY MOUTH DAILY   losartan (COZAAR) 100 MG tablet TAKE 1 TABLET BY MOUTH EVERY DAY   meloxicam (MOBIC) 7.5 MG tablet Take 1 tablet (7.5  mg total) by mouth 2 (two) times daily.   Multiple Vitamin (MULTIVITAMIN) tablet Take 1 tablet by mouth daily.   potassium chloride SA (K-DUR,KLOR-CON) 20 MEQ tablet Take 1 tablet by mouth every other day.    sildenafil (VIAGRA) 100 MG tablet Take 100 mg by mouth as needed.    simvastatin (ZOCOR) 20 MG tablet Take 1 tablet (20 mg total) by mouth at bedtime.   torsemide (DEMADEX) 20 MG tablet Take 20 mg by mouth daily.   TRADJENTA 5 MG TABS tablet TAKE 1 TABLET BY MOUTH EVERY DAY   No facility-administered encounter medications on file as of 09/09/2020.    Allergies (verified) Aspirin and Codeine   History: Past Medical History:  Diagnosis Date   CHF (congestive heart failure) (HCC)    Chronic kidney disease    Diabetes mellitus without complication (HCC)    Diverticulosis    GERD (gastroesophageal reflux disease)    Hypercholesteremia    Hypertension    Hypothyroidism    Obstructive sleep apnea    CPAP   Wears dentures    partial top   Past Surgical History:  Procedure Laterality Date   BACK SURGERY     CATARACT EXTRACTION W/PHACO Left 10/24/2014   Procedure: CATARACT EXTRACTION PHACO AND INTRAOCULAR LENS PLACEMENT ([place]);  Surgeon: Ronnell Freshwater, MD;  Location: [place];  Service: Ophthalmology;  Laterality: Left;  DIABETIC - oral meds   CATARACT EXTRACTION W/PHACO Right 06/17/2017  Procedure: CATARACT EXTRACTION PHACO AND INTRAOCULAR LENS PLACEMENT ([facility]);  Surgeon: Birder Robson, MD;  Location: [facility];  Service: Ophthalmology;  Laterality: Right;  [place] 01:43 AP% 19.7 CDE 20.48 Fluid pack lot # [id number]   COLONOSCOPY     HERNIA REPAIR     PILONIDAL CYST EXCISION     STOMACH SURGERY     Family History  Problem Relation Age of Onset   Hypertension Mother    Kidney failure Mother    Diabetes Father    Lung cancer Brother    Colon cancer Brother    Cancer Brother        skin, removed   Dementia Sister    Dementia Sister    Hypertension  Sister    Social History   Socioeconomic History   Marital status: Married    Spouse name: Not on file   Number of children: 1   Years of education: Not on file   Highest education level: High school graduate  Occupational History   Occupation: retired  Tobacco Use   Smoking status: Never   Smokeless tobacco: Never  Scientific laboratory technician Use: Never used  Substance and Sexual Activity   Alcohol use: No    Alcohol/week: 0.0 standard drinks   Drug use: No   Sexual activity: Not Currently  Other Topics Concern   Not on file  Social History Narrative   Not on file   Social Determinants of Health   Financial Resource Strain: Low Risk    Difficulty of Paying Living Expenses: Not hard at all  Food Insecurity: No Food Insecurity   Worried About Charity fundraiser in the Last Year: Never true   [place] in the Last Year: Never true  Transportation Needs: No Transportation Needs   Lack of Transportation (Medical): No   Lack of Transportation (Non-Medical): No  Physical Activity: Inactive   Days of Exercise per Week: 0 days   Minutes of Exercise per Session: 0 min  Stress: No Stress Concern Present   Feeling of Stress : Not at all  Social Connections: Not on file    Tobacco Counseling Counseling given: Not Answered   Clinical Intake:     Pain : No/denies pain     Diabetes: Yes CBG done?: No Did pt. bring in CBG monitor from home?: No  How often do you need to have someone help you when you read instructions, pamphlets, or other written materials from your doctor or pharmacy?: 1 - Never  Diabetic? Yes         Activities of Daily Living In your present state of health, do you have any difficulty performing the following activities: 09/09/2020  Hearing? Y  Comment DIFFICULTY HEARING  Vision? Y  Comment GLASSES  Difficulty concentrating or making decisions? N  Walking or climbing stairs? N  Dressing or bathing? N  Doing errands, shopping? N   Preparing Food and eating ? Y  Using the Toilet? Y  In the past six months, have you accidently leaked urine? N  Do you have problems with loss of bowel control? N  Managing your Medications? Y  Managing your Finances? Y  Housekeeping or managing your Housekeeping? Y  Some recent data might be hidden    Patient Care Team: Jerrol Banana., MD as PCP - General (Family Medicine) Lavonia Dana, MD as Consulting Physician (Internal Medicine) Dionisio David, MD as Consulting Physician (Cardiology) Birder Robson, MD as Referring Physician (Ophthalmology)  Indicate any recent Medical Services you may have received from other than [facility] providers in the past year (date may be approximate).     Assessment:   This is a routine wellness examination for [place].  Hearing/Vision screen No results found.  Dietary issues and exercise activities discussed: Current Exercise Habits: The patient has a physically strenuous job, but has no regular exercise apart from work., Exercise limited by: None identified   Goals Addressed   None    Depression Screen PHQ 2/9 Scores 09/09/2020 06/23/2019 06/22/2018 09/15/2017 06/16/2017 03/10/2017 02/06/2017  PHQ - 2 Score 0 0 0 0 0 0 0  PHQ- 9 Score - - - - - - -    Fall Risk Fall Risk  09/09/2020 06/23/2019 06/22/2018 09/15/2017 06/16/2017  Falls in the past year? 0 0 0 No No  Number falls in past yr: - 0 - - -  Injury with Fall? - 0 - - -  Follow up Follow up appointment - - - -    FALL RISK PREVENTION PERTAINING TO THE HOME:  Any stairs in or around the home? Yes  If so, are there any without handrails? No  Home free of loose throw rugs in walkways, pet beds, electrical cords, etc? No  Adequate lighting in your home to reduce risk of falls? Yes   ASSISTIVE DEVICES UTILIZED TO PREVENT FALLS:  Life alert? No  Use of a cane, walker or w/c? No  Grab bars in the bathroom? Yes  Shower chair or bench in shower? Yes  Elevated toilet seat or a  handicapped toilet? No   TIMED UP AND GO:  Was the test performed? No .  Length of time to ambulate 10 feet:  sec.   Gait steady and fast without use of assistive device  Cognitive Function:     6CIT Screen 09/09/2020 06/22/2018 05/15/2016  What Year? 0 points 0 points 0 points  What month? 0 points 0 points 0 points  What time? 0 points 0 points 0 points  Count back from 20 0 points 0 points 0 points  Months in reverse 4 points 4 points 4 points  Repeat phrase 2 points 4 points 2 points  Total Score '6 8 6    '$ Immunizations Immunization History  Administered Date(s) Administered   Fluad Quad(high Dose 65+) 11/02/2019   Influenza, High Dose Seasonal PF 11/23/2014, 10/23/2015, 10/31/2016, 10/15/2017, 10/21/2018   Moderna Sars-Covid-2 Vaccination 02/23/2019, 03/23/2019, 12/09/2019   Pneumococcal Conjugate-13 12/13/2013   Pneumococcal Polysaccharide-23 06/08/2012   Td 04/28/2003   Zoster Recombinat (Shingrix) 11/09/2019, 03/01/2020    TDAP status: Up to date  Flu Vaccine status: Up to date  Pneumococcal vaccine status: Up to date  Covid-19 vaccine status: Completed vaccines  Qualifies for Shingles Vaccine? Yes   Zostavax completed Yes   Shingrix Completed?: Yes  Screening Tests Health Maintenance  Topic Date Due   FOOT EXAM  02/21/2019   COVID-19 Vaccine (4 - Booster for Moderna series) 04/08/2020   INFLUENZA VACCINE  08/07/2020   TETANUS/TDAP  01/07/2026 (Originally 04/27/2013)   HEMOGLOBIN A1C  12/13/2020   OPHTHALMOLOGY EXAM  06/08/2021   PNA vac Low Risk Adult  Completed   Zoster Vaccines- Shingrix  Completed   HPV VACCINES  Aged Out    Health Maintenance  Health Maintenance Due  Topic Date Due   FOOT EXAM  02/21/2019   COVID-19 Vaccine (4 - Booster for Moderna series) 04/08/2020   INFLUENZA VACCINE  08/07/2020    Colorectal cancer screening:  No longer required.   Lung Cancer Screening: (Low Dose CT Chest recommended if Age 52-80 years, 30 pack-year  currently smoking OR have quit w/in 15years.) does not qualify.   Lung Cancer Screening Referral: none  Additional Screening:  Hepatitis C Screening: does not qualify; Completed   Vision Screening: Recommended annual ophthalmology exams for early detection of glaucoma and other disorders of the eye. Is the patient up to date with their annual eye exam?  Yes  Who is the provider or what is the name of the office in which the patient attends annual eye exams? [facility] If pt is not established with a provider, would they like to be referred to a provider to establish care? No .   Dental Screening: Recommended annual dental exams for proper oral hygiene  Community Resource Referral / Chronic Care Management: CRR required this visit?  No   CCM required this visit?  No      Plan:     I have personally reviewed and noted the following in the patient's chart:   Medical and social history Use of alcohol, tobacco or illicit drugs  Current medications and supplements including opioid prescriptions. Patient is not currently taking opioid prescriptions. Functional ability and status Nutritional status Physical activity Advanced directives List of other physicians Hospitalizations, surgeries, and ER visits in previous 12 months Vitals Screenings to include cognitive, depression, and falls Referrals and appointments  In addition, I have reviewed and discussed with patient certain preventive protocols, quality metrics, and best practice recommendations. A written personalized care plan for preventive services as well as general preventive health recommendations were provided to patient.     Allegra Grana, CMA   09/09/2020   Nurse Notes:  Non-Face to Face 45 minute visit  Mr. Plucinski , Thank you for taking time to come for your Medicare Wellness Visit. I appreciate your ongoing commitment to your health goals. Please review the following plan we discussed and let me  know if I can assist you in the future.   These are the goals we discussed:  Goals      Increase water intake     Recommend increasing water intake to 4-6 glasses a day.        This is a list of the screening recommended for you and due dates:  Health Maintenance  Topic Date Due   Complete foot exam   02/21/2019   COVID-19 Vaccine (4 - Booster for Moderna series) 04/08/2020   Flu Shot  08/07/2020   Tetanus Vaccine  01/07/2026*   Hemoglobin A1C  12/13/2020   Eye exam for diabetics  06/08/2021   Pneumonia vaccines  Completed   Zoster (Shingles) Vaccine  Completed   HPV Vaccine  Aged Out  *Topic was postponed. The date shown is not the original due date.

## 2020-09-12 ENCOUNTER — Other Ambulatory Visit: Payer: Self-pay | Admitting: Family Medicine

## 2020-09-13 NOTE — Telephone Encounter (Signed)
Requested medications are due for refill today.  yes  Requested medications are on the active medications list.  yes  Last refill.   Future visit scheduled.   yes  Notes to clinic.  There are 2 Rx's for the same medication. Please advise.

## 2020-09-13 NOTE — Telephone Encounter (Signed)
Requested Prescriptions  Pending Prescriptions Disp Refills  . simvastatin (ZOCOR) 20 MG tablet [Pharmacy Med Name: SIMVASTATIN '20MG'$  TABLETS] 90 tablet 1    Sig: TAKE 1 TABLET(20 MG) BY MOUTH AT BEDTIME     Cardiovascular:  Antilipid - Statins Failed - 09/12/2020  3:00 PM      Failed - HDL in normal range and within 360 days    HDL  Date Value Ref Range Status  06/13/2020 39 (L) >39 mg/dL Final         Passed - Total Cholesterol in normal range and within 360 days    Cholesterol, Total  Date Value Ref Range Status  06/13/2020 151 100 - 199 mg/dL Final         Passed - LDL in normal range and within 360 days    LDL Chol Calc (NIH)  Date Value Ref Range Status  06/13/2020 88 0 - 99 mg/dL Final         Passed - Triglycerides in normal range and within 360 days    Triglycerides  Date Value Ref Range Status  06/13/2020 133 0 - 149 mg/dL Final         Passed - Patient is not pregnant      Passed - Valid encounter within last 12 months    Recent Outpatient Visits          3 months ago Type 2 diabetes mellitus with stage 3a chronic kidney disease, without long-term current use of insulin ([place])   [facility] Jerrol Banana., MD   6 months ago Type 2 diabetes mellitus with stage 3a chronic kidney disease, without long-term current use of insulin ([place])   [facility] Jerrol Banana., MD   10 months ago Type 2 diabetes mellitus with stage 3a chronic kidney disease, without long-term current use of insulin ([place])   [facility] Jerrol Banana., MD   1 year ago Essential (primary) hypertension   [facility] Jerrol Banana., MD   1 year ago Type 2 diabetes mellitus with stage 3a chronic kidney disease, without long-term current use of insulin [facility])   [facility] Jerrol Banana., MD      Future Appointments            In 1 month Jerrol Banana., MD [facility]           . levothyroxine (SYNTHROID) 175 MCG tablet [Pharmacy Med Name: LEVOTHYROXINE 0.'175MG'$  (175MCG) TABS] 90 tablet 2    Sig: TAKE 1 TABLET(175 MCG) BY MOUTH DAILY     Endocrinology:  Hypothyroid Agents Failed - 09/12/2020  3:00 PM      Failed - TSH needs to be rechecked within 3 months after an abnormal result. Refill until TSH is due.      Passed - TSH in normal range and within 360 days    TSH  Date Value Ref Range Status  06/13/2020 3.590 0.450 - 4.500 uIU/mL Final         Passed - Valid encounter within last 12 months    Recent Outpatient Visits          3 months ago Type 2 diabetes mellitus with stage 3a chronic kidney disease, without long-term current use of insulin [facility])   [facility] Jerrol Banana., MD   6 months ago Type 2 diabetes mellitus with stage 3a chronic kidney disease, without long-term current use  of insulin [facility])   [facility] Jerrol Banana., MD   10 months ago Type 2 diabetes mellitus with stage 3a chronic kidney disease, without long-term current use of insulin [facility])   [facility] Jerrol Banana., MD   1 year ago Essential (primary) hypertension   [facility] Jerrol Banana., MD   1 year ago Type 2 diabetes mellitus with stage 3a chronic kidney disease, without long-term current use of insulin [facility])   [facility] Jerrol Banana., MD      Future Appointments            In 1 month Jerrol Banana., MD [facility]           . levothyroxine (SYNTHROID) 175 MCG tablet [Pharmacy Med Name: LEVOTHYROXINE 0.'175MG'$  (175MCG) TABS] 90 tablet 2    Sig: TAKE 1 TABLET BY MOUTH EVERY DAY     Endocrinology:  Hypothyroid Agents Failed - 09/12/2020  3:00 PM      Failed - TSH needs to be rechecked within 3 months after an abnormal result. Refill until TSH is due.      Passed - TSH in normal range and within 360 days     TSH  Date Value Ref Range Status  06/13/2020 3.590 0.450 - 4.500 uIU/mL Final         Passed - Valid encounter within last 12 months    Recent Outpatient Visits          3 months ago Type 2 diabetes mellitus with stage 3a chronic kidney disease, without long-term current use of insulin [facility])   [facility] Jerrol Banana., MD   6 months ago Type 2 diabetes mellitus with stage 3a chronic kidney disease, without long-term current use of insulin [facility])   [facility] Jerrol Banana., MD   10 months ago Type 2 diabetes mellitus with stage 3a chronic kidney disease, without long-term current use of insulin [facility])   [facility] Jerrol Banana., MD   1 year ago Essential (primary) hypertension   [facility] Jerrol Banana., MD   1 year ago Type 2 diabetes mellitus with stage 3a chronic kidney disease, without long-term current use of insulin [facility])   [facility] Jerrol Banana., MD      Future Appointments            In 1 month Jerrol Banana., MD [facility]

## 2020-09-15 ENCOUNTER — Other Ambulatory Visit: Payer: Self-pay | Admitting: Family Medicine

## 2020-10-01 ENCOUNTER — Other Ambulatory Visit: Payer: Self-pay | Admitting: Family Medicine

## 2020-10-02 ENCOUNTER — Other Ambulatory Visit: Payer: Self-pay | Admitting: Family Medicine

## 2020-10-06 ENCOUNTER — Other Ambulatory Visit: Payer: Self-pay | Admitting: Family Medicine

## 2020-10-06 DIAGNOSIS — I10 Essential (primary) hypertension: Secondary | ICD-10-CM

## 2020-10-16 ENCOUNTER — Ambulatory Visit: Payer: Self-pay | Admitting: Family Medicine

## 2020-10-16 DIAGNOSIS — Z03818 Encounter for observation for suspected exposure to other biological agents ruled out: Secondary | ICD-10-CM | POA: Diagnosis not present

## 2020-10-16 DIAGNOSIS — Z20822 Contact with and (suspected) exposure to covid-19: Secondary | ICD-10-CM | POA: Diagnosis not present

## 2020-10-16 NOTE — Progress Notes (Deleted)
Established patient visit   Patient: Randy Dalton   DOB: 1927/12/26   87 y.o. Male  MRN: [id number] Visit Date: 10/16/2020  Today's healthcare provider: [name], MD   No chief complaint on file.  Subjective    HPI  Diabetes Mellitus Type II, follow-up  Lab Results  Component Value Date   HGBA1C 7.6 (A) 06/13/2020   HGBA1C 9.0 (A) 03/01/2020   HGBA1C 9.5 (A) 11/09/2019   Last seen for diabetes 4 months ago.  Management since then includes; Good control with A1c of 7.6.  We will strongly consider stopping glimepiride in the future. He reports {excellent/good/fair/poor:19665} compliance with treatment. He {is/is not:21021397} having side effects. {document side effects if present:1}  Home blood sugar records: {diabetes glucometry results:16657}  Episodes of hypoglycemia? {Yes/No:20286} {enter details if yes:1}   Current insulin regiment: {***Type 'None' if not taking insulin                                                otherwise enter complete                                                 details of insulin regiment:1} Most Recent Eye Exam: ***  --------------------------------------------------------------------------------------------------- Hypertension, follow-up  BP Readings from Last 3 Encounters:  06/13/20 139/60  03/01/20 (!) 153/70  11/09/19 121/62   Wt Readings from Last 3 Encounters:  06/13/20 204 lb 12.8 oz (92.9 kg)  03/01/20 213 lb (96.6 kg)  11/09/19 210 lb (95.3 kg)     He was last seen for hypertension 4 months ago.  BP at that visit was 139/60. Management since that visit includes; Excellent control amlodipine 10 and hydralazine losartan and torsemide. He reports {excellent/good/fair/poor:19665} compliance with treatment. He {is/is not:9024} having side effects. {document side effects if present:1} He {is/is not:9024} exercising. He {is/is not:9024} adherent to low salt diet.   Outside blood pressures are {enter patient  reported home BP, or 'not being checked':1}.  He {does/does not:200015} smoke.  Use of agents associated with hypertension: {bp agents assoc with hypertension:511::"none"}.   ---------------------------------------------------------------------------------------------------   {Link to patient history deactivated due to formatting error:1}  Medications: Outpatient Medications Prior to Visit  Medication Sig   amLODipine (NORVASC) 10 MG tablet TAKE 1 TABLET BY MOUTH EVERY DAY   carvedilol (COREG) 12.5 MG tablet TAKE 1 TABLET BY MOUTH TWICE DAILY   Cholecalciferol (VITAMIN D) 2000 UNITS tablet Take 2,000 Units by mouth daily.    glimepiride (AMARYL) 4 MG tablet TAKE 1 TABLET BY MOUTH EVERY DAY   hydrALAZINE (APRESOLINE) 100 MG tablet Take 1 tablet (100 mg total) by mouth 2 (two) times daily.   levothyroxine (SYNTHROID) 175 MCG tablet TAKE 1 TABLET BY MOUTH EVERY DAY   losartan (COZAAR) 100 MG tablet TAKE 1 TABLET BY MOUTH EVERY DAY   meloxicam (MOBIC) 7.5 MG tablet Take 1 tablet (7.5 mg total) by mouth 2 (two) times daily.   Multiple Vitamin (MULTIVITAMIN) tablet Take 1 tablet by mouth daily.   potassium chloride SA (K-DUR,KLOR-CON) 20 MEQ tablet Take 1 tablet by mouth every other day.    sildenafil (VIAGRA) 100 MG tablet Take 100 mg by mouth as needed.  simvastatin (ZOCOR) 20 MG tablet TAKE 1 TABLET(20 MG) BY MOUTH AT BEDTIME   torsemide (DEMADEX) 20 MG tablet Take 20 mg by mouth daily.   TRADJENTA 5 MG TABS tablet TAKE 1 TABLET BY MOUTH EVERY DAY   No facility-administered medications prior to visit.    Review of Systems  Constitutional:  Negative for appetite change, chills and fever.  Respiratory:  Negative for chest tightness, shortness of breath and wheezing.   Cardiovascular:  Negative for chest pain and palpitations.  Gastrointestinal:  Negative for abdominal pain, nausea and vomiting.   {Labs  Heme  Chem  Endocrine  Serology  Results Review (optional):23779}    Objective    There were no vitals taken for this visit. {Show previous vital signs (optional):23777}  Physical Exam  ***  No results found for any visits on 10/16/20.  Assessment & Plan     ***  No follow-ups on file.      {provider attestation***:1}   [name], MD  [facility] [phone] (phone) [phone] (fax)  [place]

## 2020-10-17 ENCOUNTER — Ambulatory Visit (INDEPENDENT_AMBULATORY_CARE_PROVIDER_SITE_OTHER): Payer: Medicare Other | Admitting: Family Medicine

## 2020-10-17 ENCOUNTER — Other Ambulatory Visit: Payer: Self-pay | Admitting: *Deleted

## 2020-10-17 ENCOUNTER — Ambulatory Visit: Payer: Self-pay | Admitting: *Deleted

## 2020-10-17 ENCOUNTER — Other Ambulatory Visit: Payer: Self-pay

## 2020-10-17 ENCOUNTER — Encounter: Payer: Self-pay | Admitting: Family Medicine

## 2020-10-17 VITALS — BP 159/75 | HR 69 | Temp 98.4°F | Resp 16 | Wt 208.0 lb

## 2020-10-17 DIAGNOSIS — I5032 Chronic diastolic (congestive) heart failure: Secondary | ICD-10-CM

## 2020-10-17 DIAGNOSIS — Z6832 Body mass index (BMI) 32.0-32.9, adult: Secondary | ICD-10-CM

## 2020-10-17 DIAGNOSIS — E6609 Other obesity due to excess calories: Secondary | ICD-10-CM | POA: Diagnosis not present

## 2020-10-17 DIAGNOSIS — E782 Mixed hyperlipidemia: Secondary | ICD-10-CM | POA: Diagnosis not present

## 2020-10-17 DIAGNOSIS — G4733 Obstructive sleep apnea (adult) (pediatric): Secondary | ICD-10-CM | POA: Diagnosis not present

## 2020-10-17 DIAGNOSIS — J069 Acute upper respiratory infection, unspecified: Secondary | ICD-10-CM

## 2020-10-17 DIAGNOSIS — I10 Essential (primary) hypertension: Secondary | ICD-10-CM

## 2020-10-17 DIAGNOSIS — N1831 Chronic kidney disease, stage 3a: Secondary | ICD-10-CM | POA: Diagnosis not present

## 2020-10-17 DIAGNOSIS — E039 Hypothyroidism, unspecified: Secondary | ICD-10-CM

## 2020-10-17 DIAGNOSIS — E1122 Type 2 diabetes mellitus with diabetic chronic kidney disease: Secondary | ICD-10-CM | POA: Diagnosis not present

## 2020-10-17 LAB — POCT GLYCOSYLATED HEMOGLOBIN (HGB A1C)
Est. average glucose Bld gHb Est-mCnc: 186
Hemoglobin A1C: 8.1 % — AB (ref 4.0–5.6)

## 2020-10-17 NOTE — Progress Notes (Signed)
I,[name],acting as a scribe for [name], MD.,have documented all relevant documentation on the behalf of [name], MD,as directed by  [name], MD while in the presence of [name], MD.  Established patient visit   Patient: Randy Dalton   DOB: May 28, 1927   87 y.o. Male  MRN: [id number] Visit Date: 10/17/2020  Today's healthcare provider: [name], MD   Chief Complaint  Patient presents with   Follow-up   Diabetes   Subjective    HPI  Patient comes in today for follow-up of diabetes.  Probably feels well but has had URI symptoms for the past 5 days.  He did have a negative COVID test.  Mild sore throat chest congestion and postnasal drainage.  No breathing difficulties, no myalgias, no fever Diabetes Mellitus Type II, follow-up  Lab Results  Component Value Date   HGBA1C 7.6 (A) 06/13/2020   HGBA1C 9.0 (A) 03/01/2020   HGBA1C 9.5 (A) 11/09/2019   Last seen for diabetes 4 months ago.  Management since then includes; Good control with A1c of 7.6.  We will strongly consider stopping glimepiride in the future. He reports good compliance with treatment. He is not having side effects. none  Home blood sugar records: fasting range: checks occasionally  Episodes of hypoglycemia? No none   Current insulin regiment: n/a Most Recent Eye Exam:  1 month ago  ---------------------------------------------------------------------------------------------------     Medications: Outpatient Medications Prior to Visit  Medication Sig   amLODipine (NORVASC) 10 MG tablet TAKE 1 TABLET BY MOUTH EVERY DAY   carvedilol (COREG) 12.5 MG tablet TAKE 1 TABLET BY MOUTH TWICE DAILY   Cholecalciferol (VITAMIN D) 2000 UNITS tablet Take 2,000 Units by mouth daily.    glimepiride (AMARYL) 4 MG tablet TAKE 1 TABLET BY MOUTH EVERY DAY   hydrALAZINE (APRESOLINE) 100 MG tablet Take 1 tablet (100 mg total) by mouth 2 (two) times daily.    levothyroxine (SYNTHROID) 175 MCG tablet TAKE 1 TABLET BY MOUTH EVERY DAY   losartan (COZAAR) 100 MG tablet TAKE 1 TABLET BY MOUTH EVERY DAY   meloxicam (MOBIC) 7.5 MG tablet Take 1 tablet (7.5 mg total) by mouth 2 (two) times daily.   Multiple Vitamin (MULTIVITAMIN) tablet Take 1 tablet by mouth daily.   potassium chloride SA (K-DUR,KLOR-CON) 20 MEQ tablet Take 1 tablet by mouth every other day.    sildenafil (VIAGRA) 100 MG tablet Take 100 mg by mouth as needed.    simvastatin (ZOCOR) 20 MG tablet TAKE 1 TABLET(20 MG) BY MOUTH AT BEDTIME   torsemide (DEMADEX) 20 MG tablet Take 20 mg by mouth daily.   TRADJENTA 5 MG TABS tablet TAKE 1 TABLET BY MOUTH EVERY DAY   No facility-administered medications prior to visit.    Review of Systems  Last hemoglobin A1c Lab Results  Component Value Date   HGBA1C 8.1 (A) 10/17/2020       Objective    BP (!) 159/75 (BP Location: Left Arm, Patient Position: Sitting, Cuff Size: Large)   Pulse 69   Temp 98.4 F (36.9 C) (Temporal)   Resp 16   Wt 208 lb (94.3 kg)   SpO2 97%   BMI 32.58 kg/m  BP Readings from Last 3 Encounters:  10/17/20 (!) 159/75  06/13/20 139/60  03/01/20 (!) 153/70   Wt Readings from Last 3 Encounters:  10/17/20 208 lb (94.3 kg)  06/13/20 204 lb 12.8 oz (92.9 kg)  03/01/20 213  lb (96.6 kg)      Physical Exam Vitals and nursing note reviewed.  Constitutional:      Appearance: Normal appearance.  HENT:     Right Ear: Tympanic membrane normal.     Left Ear: Tympanic membrane normal.     Nose: Nose normal.     Mouth/Throat:     Mouth: Mucous membranes are moist.  Eyes:     General: No scleral icterus.    Conjunctiva/sclera: Conjunctivae normal.  Cardiovascular:     Rate and Rhythm: Normal rate and regular rhythm.     Pulses: Normal pulses.     Heart sounds: Normal heart sounds.  Pulmonary:     Effort: Pulmonary effort is normal.     Breath sounds: Normal breath sounds.  Abdominal:     General: Bowel sounds  are normal.     Palpations: Abdomen is soft.  Musculoskeletal:     Cervical back: Normal range of motion and neck supple.  Skin:    General: Skin is warm and dry.  Neurological:     General: No focal deficit present.     Mental Status: He is alert and oriented to person, place, and time.  Psychiatric:        Mood and Affect: Mood normal.        Behavior: Behavior normal.        Thought Content: Thought content normal.        Judgment: Judgment normal.      No results found for any visits on 10/17/20.  Assessment & Plan     1. Type 2 diabetes mellitus with stage 3a chronic kidney disease, without long-term current use of insulin ([place]) Trolled control in 87 year old with an A1c of 8.1 on Tradjenta and glimepiride.  If he develops any hypoglycemia we will stop glimepiride. - POCT glycosylated hemoglobin (Hb A1C) - linagliptin (TRADJENTA) 5 MG TABS tablet; Take 1 tablet (5 mg total) by mouth daily.  Dispense: 90 tablet; Refill: 1  2. Essential (primary) hypertension Good control on pain Coreg Dralzine and losartan - amLODipine (NORVASC) 10 MG tablet; Take 1 tablet (10 mg total) by mouth daily.  Dispense: 90 tablet; Refill: 1 - losartan (COZAAR) 100 MG tablet; Take 1 tablet (100 mg total) by mouth daily.  Dispense: 90 tablet; Refill: 1  3. Chronic diastolic heart failure (HCC) On the above medications  4. Obstructive sleep apnea syndrome On CPAP  5. Adult hypothyroidism   6. Class 1 obesity due to excess calories with serious comorbidity and body mass index (BMI) of 32.0 to 32.9 in adult   7. Mixed hyperlipidemia Simvastatin 20  8. Viral upper respiratory tract infection Treat with fluids Tylenol and Robitussin    No follow-ups on file.      I, [name], MD, have reviewed all documentation for this visit. The documentation on 10/28/20 for the exam, diagnosis, procedures, and orders are all accurate and complete.    Bettina Warn Cranford Mon, MD  [facility] [phone] (phone) [phone] (fax)  [place]

## 2020-10-17 NOTE — Telephone Encounter (Signed)
Patient was seen in office today.  

## 2020-10-17 NOTE — Telephone Encounter (Signed)
Pt spouse called to schedule appt for pt due to respiratory issues. Pt spouse also stated pt has a cough and possibly allergies. Pt spouse requests call back for an appt that is sooner than what is currently available. Cb# [phone]   Call to patient- patient reports no emergent breathing problems and he is planning on coming to appointment at 1pm today. Call closed- patient has appointment scheduled by the office.

## 2020-10-25 ENCOUNTER — Ambulatory Visit (INDEPENDENT_AMBULATORY_CARE_PROVIDER_SITE_OTHER): Payer: Medicare Other

## 2020-10-25 ENCOUNTER — Other Ambulatory Visit: Payer: Self-pay

## 2020-10-25 DIAGNOSIS — Z23 Encounter for immunization: Secondary | ICD-10-CM

## 2020-10-28 MED ORDER — LINAGLIPTIN 5 MG PO TABS: 5.0000 mg | ORAL_TABLET | Freq: Every day | ORAL | 1 refills | Status: DC

## 2020-10-28 MED ORDER — LOSARTAN POTASSIUM 100 MG PO TABS: 100.0000 mg | ORAL_TABLET | Freq: Every day | ORAL | 1 refills | Status: DC

## 2020-10-28 MED ORDER — AMLODIPINE BESYLATE 10 MG PO TABS: 10.0000 mg | ORAL_TABLET | Freq: Every day | ORAL | 1 refills | Status: DC

## 2020-11-09 ENCOUNTER — Ambulatory Visit: Payer: Medicare Other | Admitting: Family Medicine

## 2020-11-09 NOTE — Progress Notes (Deleted)
Established patient visit   Patient: Randy Dalton   DOB: September 08, 1927   87 y.o. Male  MRN: [id number] Visit Date: 11/09/2020  Today's healthcare provider: [name], MD   No chief complaint on file.  Subjective    HPI  Diabetes Mellitus Type II, Follow-up  Lab Results  Component Value Date   HGBA1C 8.1 (A) 10/17/2020   HGBA1C 7.6 (A) 06/13/2020   HGBA1C 9.0 (A) 03/01/2020   Wt Readings from Last 3 Encounters:  10/17/20 208 lb (94.3 kg)  06/13/20 204 lb 12.8 oz (92.9 kg)  03/01/20 213 lb (96.6 kg)   Last seen for diabetes 1 months ago.  Management since then includes none. He reports {excellent/good/fair/poor:19665} compliance with treatment. He {is/is not:21021397} having side effects. {document side effects if present:1} Symptoms: {Yes/No:20286} fatigue {Yes/No:20286} foot ulcerations  {Yes/No:20286} appetite changes {Yes/No:20286} nausea  {Yes/No:20286} paresthesia of the feet  {Yes/No:20286} polydipsia  {Yes/No:20286} polyuria {Yes/No:20286} visual disturbances   {Yes/No:20286} vomiting     Home blood sugar records: {diabetes glucometry results:16657}  Episodes of hypoglycemia? {Yes/No:20286} {enter symptoms and frequency of symptoms if yes:1}   Current insulin regiment: {enter 'none' or type of insulin and number of units taken with each dose of each insulin formulation that the patient is taking:1} Most Recent Eye Exam: *** {Current exercise:16438:::1} {Current diet habits:16563:::1}  Pertinent Labs: Lab Results  Component Value Date   CHOL 151 06/13/2020   HDL 39 (L) 06/13/2020   LDLCALC 88 06/13/2020   TRIG 133 06/13/2020   CHOLHDL 3.9 06/13/2020   Lab Results  Component Value Date   NA 138 06/13/2020   K 4.7 06/13/2020   CREATININE 1.85 (H) 06/13/2020   EGFR 34 (L) 06/13/2020   MICROALBUR 20 10/21/2018   LABMICR 49.0 03/11/2017      ---------------------------------------------------------------------------------------------------   {Link to patient history deactivated due to formatting error:1}  Medications: Outpatient Medications Prior to Visit  Medication Sig   amLODipine (NORVASC) 10 MG tablet Take 1 tablet (10 mg total) by mouth daily.   carvedilol (COREG) 12.5 MG tablet TAKE 1 TABLET BY MOUTH TWICE DAILY   Cholecalciferol (VITAMIN D) 2000 UNITS tablet Take 2,000 Units by mouth daily.    glimepiride (AMARYL) 4 MG tablet TAKE 1 TABLET BY MOUTH EVERY DAY   hydrALAZINE (APRESOLINE) 100 MG tablet Take 1 tablet (100 mg total) by mouth 2 (two) times daily.   levothyroxine (SYNTHROID) 175 MCG tablet TAKE 1 TABLET BY MOUTH EVERY DAY   linagliptin (TRADJENTA) 5 MG TABS tablet Take 1 tablet (5 mg total) by mouth daily.   losartan (COZAAR) 100 MG tablet Take 1 tablet (100 mg total) by mouth daily.   meloxicam (MOBIC) 7.5 MG tablet Take 1 tablet (7.5 mg total) by mouth 2 (two) times daily.   Multiple Vitamin (MULTIVITAMIN) tablet Take 1 tablet by mouth daily.   potassium chloride SA (K-DUR,KLOR-CON) 20 MEQ tablet Take 1 tablet by mouth every other day.    sildenafil (VIAGRA) 100 MG tablet Take 100 mg by mouth as needed.    simvastatin (ZOCOR) 20 MG tablet TAKE 1 TABLET(20 MG) BY MOUTH AT BEDTIME   torsemide (DEMADEX) 20 MG tablet Take 20 mg by mouth daily.   No facility-administered medications prior to visit.    Review of Systems  {Labs  Heme  Chem  Endocrine  Serology  Results Review (optional):23779}   Objective    There were no vitals taken for this visit. {Show previous vital signs (optional):23777}  Physical Exam  ***  No results found for any visits on 11/09/20.  Assessment & Plan     ***  No follow-ups on file.      {provider attestation***:1}   [name], MD  [facility] [phone] (phone) [phone] (fax)  [place]

## 2020-11-10 ENCOUNTER — Other Ambulatory Visit: Payer: Self-pay | Admitting: Family Medicine

## 2020-11-10 MED ORDER — GLIMEPIRIDE 4 MG PO TABS: 4.0000 mg | ORAL_TABLET | Freq: Every day | ORAL | 1 refills | Status: DC

## 2020-11-10 NOTE — Telephone Encounter (Signed)
Medication Refill - Medication:glimepiride (AMARYL) 4 MG tablet   Has the patient contacted their pharmacy? yes (Agent: If no, request that the patient contact the pharmacy for the refill. If patient does not wish to contact the pharmacy document the reason why and proceed with request.) (Agent: If yes, when and what did the pharmacy advise?)  Preferred Pharmacy (with phone number or street name):  [facility], [address] Phone:  [phone]  Fax:  [phone]     Has the patient been seen for an appointment in the last year OR does the patient have an upcoming appointment? yes  Agent: Please be advised that RX refills may take up to 3 business days. We ask that you follow-up with your pharmacy.

## 2020-11-10 NOTE — Telephone Encounter (Signed)
Requested Prescriptions  Pending Prescriptions Disp Refills  . glimepiride (AMARYL) 4 MG tablet 90 tablet 1    Sig: Take 1 tablet (4 mg total) by mouth daily.     Endocrinology:  Diabetes - Sulfonylureas Failed - 11/10/2020  1:36 PM      Failed - HBA1C is between 0 and 7.9 and within 180 days    Hemoglobin A1C  Date Value Ref Range Status  10/17/2020 8.1 (A) 4.0 - 5.6 % Final   Hgb A1c MFr Bld  Date Value Ref Range Status  06/24/2019 8.4 (H) 4.8 - 5.6 % Final    Comment:             Prediabetes: 5.7 - 6.4          Diabetes: >6.4          Glycemic control for adults with diabetes: <7.0          Passed - Valid encounter within last 6 months    Recent Outpatient Visits          3 weeks ago Type 2 diabetes mellitus with stage 3a chronic kidney disease, without long-term current use of insulin ([place])   [facility] Jerrol Banana., MD   5 months ago Type 2 diabetes mellitus with stage 3a chronic kidney disease, without long-term current use of insulin ([place])   [facility] Jerrol Banana., MD   8 months ago Type 2 diabetes mellitus with stage 3a chronic kidney disease, without long-term current use of insulin [facility])   [facility] Jerrol Banana., MD   1 year ago Type 2 diabetes mellitus with stage 3a chronic kidney disease, without long-term current use of insulin [facility])   [facility] Jerrol Banana., MD   1 year ago Essential (primary) hypertension   [facility] Jerrol Banana., MD      Future Appointments            In 3 months Jerrol Banana., MD [facility] [place]   In 4 months Jerrol Banana., MD [facility], [place]

## 2020-11-13 DIAGNOSIS — Z20822 Contact with and (suspected) exposure to covid-19: Secondary | ICD-10-CM | POA: Diagnosis not present

## 2020-11-23 DIAGNOSIS — I10 Essential (primary) hypertension: Secondary | ICD-10-CM | POA: Diagnosis not present

## 2020-11-23 DIAGNOSIS — E669 Obesity, unspecified: Secondary | ICD-10-CM | POA: Diagnosis not present

## 2020-11-23 DIAGNOSIS — I2510 Atherosclerotic heart disease of native coronary artery without angina pectoris: Secondary | ICD-10-CM | POA: Diagnosis not present

## 2020-11-23 DIAGNOSIS — E782 Mixed hyperlipidemia: Secondary | ICD-10-CM | POA: Diagnosis not present

## 2020-11-23 DIAGNOSIS — G4730 Sleep apnea, unspecified: Secondary | ICD-10-CM | POA: Diagnosis not present

## 2020-11-23 DIAGNOSIS — R0602 Shortness of breath: Secondary | ICD-10-CM | POA: Diagnosis not present

## 2020-12-06 DIAGNOSIS — Z20822 Contact with and (suspected) exposure to covid-19: Secondary | ICD-10-CM | POA: Diagnosis not present

## 2020-12-13 DIAGNOSIS — Z20822 Contact with and (suspected) exposure to covid-19: Secondary | ICD-10-CM | POA: Diagnosis not present

## 2021-01-15 DIAGNOSIS — Z20822 Contact with and (suspected) exposure to covid-19: Secondary | ICD-10-CM | POA: Diagnosis not present

## 2021-02-05 DIAGNOSIS — Z20822 Contact with and (suspected) exposure to covid-19: Secondary | ICD-10-CM | POA: Diagnosis not present

## 2021-02-13 ENCOUNTER — Ambulatory Visit
Admission: RE | Admit: 2021-02-13 | Discharge: 2021-02-13 | Disposition: A | Discharge status: Home / Self Care | Payer: Medicare Other | Source: Ambulatory Visit | Attending: Family Medicine | Admitting: Family Medicine

## 2021-02-13 ENCOUNTER — Ambulatory Visit
Admission: RE | Admit: 2021-02-13 | Discharge: 2021-02-13 | Disposition: A | Discharge status: Home / Self Care | Payer: Medicare Other | Attending: Family Medicine | Admitting: Family Medicine

## 2021-02-13 ENCOUNTER — Other Ambulatory Visit: Payer: Self-pay

## 2021-02-13 ENCOUNTER — Encounter: Payer: Self-pay | Admitting: Family Medicine

## 2021-02-13 ENCOUNTER — Ambulatory Visit (INDEPENDENT_AMBULATORY_CARE_PROVIDER_SITE_OTHER): Payer: Medicare Other | Admitting: Family Medicine

## 2021-02-13 VITALS — BP 114/63 | HR 61 | Temp 98.1°F | Resp 16 | Wt 208.0 lb

## 2021-02-13 DIAGNOSIS — M25561 Pain in right knee: Secondary | ICD-10-CM

## 2021-02-13 DIAGNOSIS — M25571 Pain in right ankle and joints of right foot: Secondary | ICD-10-CM | POA: Insufficient documentation

## 2021-02-13 DIAGNOSIS — E6609 Other obesity due to excess calories: Secondary | ICD-10-CM | POA: Diagnosis not present

## 2021-02-13 DIAGNOSIS — G4733 Obstructive sleep apnea (adult) (pediatric): Secondary | ICD-10-CM | POA: Diagnosis not present

## 2021-02-13 DIAGNOSIS — G8929 Other chronic pain: Secondary | ICD-10-CM | POA: Diagnosis not present

## 2021-02-13 DIAGNOSIS — M1990 Unspecified osteoarthritis, unspecified site: Secondary | ICD-10-CM | POA: Diagnosis not present

## 2021-02-13 DIAGNOSIS — M25572 Pain in left ankle and joints of left foot: Secondary | ICD-10-CM

## 2021-02-13 DIAGNOSIS — M79671 Pain in right foot: Secondary | ICD-10-CM | POA: Diagnosis not present

## 2021-02-13 DIAGNOSIS — N1831 Chronic kidney disease, stage 3a: Secondary | ICD-10-CM | POA: Diagnosis not present

## 2021-02-13 DIAGNOSIS — M25562 Pain in left knee: Secondary | ICD-10-CM

## 2021-02-13 DIAGNOSIS — Z6832 Body mass index (BMI) 32.0-32.9, adult: Secondary | ICD-10-CM

## 2021-02-13 DIAGNOSIS — M79672 Pain in left foot: Secondary | ICD-10-CM | POA: Diagnosis not present

## 2021-02-13 DIAGNOSIS — E1122 Type 2 diabetes mellitus with diabetic chronic kidney disease: Secondary | ICD-10-CM | POA: Diagnosis not present

## 2021-02-13 DIAGNOSIS — N184 Chronic kidney disease, stage 4 (severe): Secondary | ICD-10-CM | POA: Diagnosis not present

## 2021-02-13 LAB — POCT GLYCOSYLATED HEMOGLOBIN (HGB A1C)
Est. average glucose Bld gHb Est-mCnc: 183
Hemoglobin A1C: 8 % — AB (ref 4.0–5.6)

## 2021-02-13 NOTE — Progress Notes (Signed)
Established patient visit  I,[name],acting as a scribe for [name], MD.,have documented all relevant documentation on the behalf of [name], MD,as directed by  [name], MD while in the presence of [name], MD.   Patient: Randy Dalton   DOB: 30-Aug-1927   88 y.o. Male  MRN: [id number] Visit Date: 02/13/2021  Today's healthcare provider: [name], MD   Chief Complaint  Patient presents with   Follow-up   Diabetes   Hypertension   Subjective    HPI  Patient comes in today for routine follow-up of his diabetes hypertension and obesity.  He does have a couple of other issues he wishes to discuss.  His CPAP is old and states he needs a new CPAP. He also is having significant knee and ankle pain bilaterally, left greater than right which hinders his ability to walk.  His wife states that he rarely does so anymore. Diabetes Mellitus Type II, follow-up  Lab Results  Component Value Date   HGBA1C 8.0 (A) 02/13/2021   HGBA1C 8.1 (A) 10/17/2020   HGBA1C 7.6 (A) 06/13/2020   Last seen for diabetes 4 months ago.  Management since then includes; controlled in 88 year old with an A1c of 8.1. On Tradjenta and glimepiride.  If he develops any hypoglycemia we will stop glimepiride. He reports good compliance with treatment. He is not having side effects. none  Home blood sugar records: fasting range: not checking  Episodes of hypoglycemia? No none   Current insulin regiment: n/a Most Recent Eye Exam: [facility] 06/08/2020  --------------------------------------------------------------------------------------------------- Hypertension, follow-up  BP Readings from Last 3 Encounters:  02/13/21 114/63  10/17/20 (!) 159/75  06/13/20 139/60   Wt Readings from Last 3 Encounters:  02/13/21 208 lb (94.3 kg)  10/17/20 208 lb (94.3 kg)  06/13/20 204 lb 12.8 oz (92.9 kg)     He was last seen for hypertension 4 months ago.  BP  at that visit was 159/75. Management since that visit includes; Good control on pain Coreg Dralzine and losartan. He reports good compliance with treatment. He is not having side effects. none He is not exercising. He is adherent to low salt diet.   Outside blood pressures are Not checking.  He does not smoke.  Use of agents associated with hypertension: none.   --------------------------------------------------------------------------------------------------- Lipid/Cholesterol, follow-up  Last Lipid Panel: Lab Results  Component Value Date   CHOL 151 06/13/2020   LDLCALC 88 06/13/2020   HDL 39 (L) 06/13/2020   TRIG 133 06/13/2020    He was last seen for this 4 months ago.  Management since that visit includes; Simvastatin 20 mg.  He reports good compliance with treatment. He is not having side effects. none  He is following a Regular, Low Sodium diet. Current exercise: none  Last metabolic panel Lab Results  Component Value Date   GLUCOSE 208 (H) 06/13/2020   NA 138 06/13/2020   K 4.7 06/13/2020   BUN 34 06/13/2020   CREATININE 1.85 (H) 06/13/2020   EGFR 34 (L) 06/13/2020   GFRNONAA 27 (L) 09/02/2019   CALCIUM 10.0 06/13/2020   AST 13 06/13/2020   ALT 12 06/13/2020   The ASCVD Risk score (Arnett DK, et al., 2019) failed to calculate for the following reasons:   The 2019 ASCVD risk score is only valid for ages 84 to 79  --------------------------------------------------------------------------------------------------- Follow up for Obstructive sleep apnea syndrome:  The patient was last seen for this  4 months ago. Changes made at last visit include; On CPAP.  He reports good compliance with treatment. He feels that condition is Unchanged. He is not having side effects. none  -----------------------------------------------------------------------------------------   Medications: Outpatient Medications Prior to Visit  Medication Sig   amLODipine  (NORVASC) 10 MG tablet Take 1 tablet (10 mg total) by mouth daily.   carvedilol (COREG) 12.5 MG tablet TAKE 1 TABLET BY MOUTH TWICE DAILY   Cholecalciferol (VITAMIN D) 2000 UNITS tablet Take 2,000 Units by mouth daily.    glimepiride (AMARYL) 4 MG tablet Take 1 tablet (4 mg total) by mouth daily.   hydrALAZINE (APRESOLINE) 100 MG tablet Take 1 tablet (100 mg total) by mouth 2 (two) times daily.   levothyroxine (SYNTHROID) 175 MCG tablet TAKE 1 TABLET BY MOUTH EVERY DAY   linagliptin (TRADJENTA) 5 MG TABS tablet Take 1 tablet (5 mg total) by mouth daily.   losartan (COZAAR) 100 MG tablet Take 1 tablet (100 mg total) by mouth daily.   meloxicam (MOBIC) 7.5 MG tablet Take 1 tablet (7.5 mg total) by mouth 2 (two) times daily.   Multiple Vitamin (MULTIVITAMIN) tablet Take 1 tablet by mouth daily.   potassium chloride SA (K-DUR,KLOR-CON) 20 MEQ tablet Take 1 tablet by mouth every other day.    sildenafil (VIAGRA) 100 MG tablet Take 100 mg by mouth as needed.    simvastatin (ZOCOR) 20 MG tablet TAKE 1 TABLET(20 MG) BY MOUTH AT BEDTIME   torsemide (DEMADEX) 20 MG tablet Take 20 mg by mouth daily.   No facility-administered medications prior to visit.    Review of Systems  Constitutional:  Negative for appetite change, chills and fever.  Respiratory:  Negative for chest tightness, shortness of breath and wheezing.   Cardiovascular:  Negative for chest pain and palpitations.  Gastrointestinal:  Negative for abdominal pain, nausea and vomiting.   Last hemoglobin A1c Lab Results  Component Value Date   HGBA1C 8.0 (A) 02/13/2021       Objective    BP 114/63 (BP Location: Left Arm, Patient Position: Sitting, Cuff Size: Normal)    Pulse 61    Temp 98.1 F (36.7 C) (Temporal)    Resp 16    Wt 208 lb (94.3 kg)    SpO2 98%    BMI 32.58 kg/m  BP Readings from Last 3 Encounters:  02/13/21 114/63  10/17/20 (!) 159/75  06/13/20 139/60   Wt Readings from Last 3 Encounters:  02/13/21 208 lb (94.3  kg)  10/17/20 208 lb (94.3 kg)  06/13/20 204 lb 12.8 oz (92.9 kg)      Physical Exam Vitals and nursing note reviewed.  Constitutional:      Appearance: Normal appearance. He is normal weight.  HENT:     Right Ear: Tympanic membrane normal.     Left Ear: Tympanic membrane normal.     Nose: Nose normal.     Mouth/Throat:     Mouth: Mucous membranes are moist.  Cardiovascular:     Rate and Rhythm: Normal rate and regular rhythm.     Pulses: Normal pulses.     Heart sounds: Normal heart sounds.  Pulmonary:     Effort: Pulmonary effort is normal.     Breath sounds: Normal breath sounds.  Abdominal:     General: Bowel sounds are normal.     Palpations: Abdomen is soft.  Musculoskeletal:     Cervical back: Neck supple.     Comments: He has no effusion or tenderness  over his lower extremities.  Skin:    General: Skin is warm and dry.  Neurological:     General: No focal deficit present.     Mental Status: He is alert and oriented to person, place, and time.  Psychiatric:        Mood and Affect: Mood normal.        Behavior: Behavior normal.        Thought Content: Thought content normal.        Judgment: Judgment normal.      Results for orders placed or performed in visit on 02/13/21  POCT glycosylated hemoglobin (Hb A1C)  Result Value Ref Range   Hemoglobin A1C 8.0 (A) 4.0 - 5.6 %   Est. average glucose Bld gHb Est-mCnc 183     Assessment & Plan      1. Type 2 diabetes mellitus with stage 3a chronic kidney disease, without long-term current use of insulin (HCC) Good control with an A1c of 8.53 in this 88 year old.  Avoid hypoglycemia.  On Tradjenta and glimepiride.  No hypoglycemia despite being on glimepiride. - POCT glycosylated hemoglobin (Hb A1C)  2. Chronic pain of both knees I think this is osteoarthritis.  [place]na recommend topical over-the-counter diclofenac gel and thigh-high compression hose for support but I think orthopedic referral may be necessary. - DG  Knee Complete 4 Views Left - DG Knee Complete 4 Views Right  3. Chronic pain of both ankles  - DG Ankle Complete Left - DG Ankle Complete Right  4. Bilateral foot pain  - DG Foot Complete Left - DG Foot Complete Right  5. Osteoarthritis, unspecified osteoarthritis type, unspecified site Avoid chronic oral nonsteroidals at all cost  6. Obstructive sleep apnea syndrome Patient request new CPAP as his machine is breaking  7. Class 1 obesity due to excess calories with serious comorbidity and body mass index (BMI) of 32.0 to 32.9 in adult Weight loss would help of all of his issues.  8. Chronic kidney disease, stage 4 (severe) (HCC) Follow-up renal function.  Stay hydrated and avoid NSAIDs   Return in about 4 months (around 06/13/2021).      I, [name], MD, have reviewed all documentation for this visit. The documentation on 02/15/21 for the exam, diagnosis, procedures, and orders are all accurate and complete.    Brysan Mcevoy Cranford Mon, MD  [facility] [phone] (phone) [phone] (fax)  [place]

## 2021-02-13 NOTE — Patient Instructions (Signed)
GET OVER-THE-COUNTER VOLTAREN GEL FOR LEG PAIN. GET THIGH HIGH COMPRESSION HOSE.

## 2021-02-21 ENCOUNTER — Ambulatory Visit (INDEPENDENT_AMBULATORY_CARE_PROVIDER_SITE_OTHER): Payer: Medicare Other | Admitting: Family Medicine

## 2021-02-21 ENCOUNTER — Other Ambulatory Visit: Payer: Self-pay

## 2021-02-21 ENCOUNTER — Encounter: Payer: Self-pay | Admitting: Family Medicine

## 2021-02-21 VITALS — BP 167/62 | HR 65 | Temp 98.2°F | Resp 16 | Ht 67.0 in | Wt 208.0 lb

## 2021-02-21 DIAGNOSIS — I10 Essential (primary) hypertension: Secondary | ICD-10-CM | POA: Diagnosis not present

## 2021-02-21 DIAGNOSIS — N1831 Chronic kidney disease, stage 3a: Secondary | ICD-10-CM

## 2021-02-21 DIAGNOSIS — G4733 Obstructive sleep apnea (adult) (pediatric): Secondary | ICD-10-CM | POA: Diagnosis not present

## 2021-02-21 DIAGNOSIS — M159 Polyosteoarthritis, unspecified: Secondary | ICD-10-CM | POA: Diagnosis not present

## 2021-02-21 DIAGNOSIS — E1122 Type 2 diabetes mellitus with diabetic chronic kidney disease: Secondary | ICD-10-CM | POA: Diagnosis not present

## 2021-02-21 NOTE — Progress Notes (Signed)
Established patient visit  I,Randy Dalton,acting as a scribe for Randy Durie, MD.,have documented all relevant documentation on the behalf of Randy Durie, MD,as directed by  Randy Durie, MD while in the presence of Randy Durie, MD.   Patient: Randy Dalton   DOB: 1927-02-14   88 y.o. Male  MRN: [id number] Visit Date: 02/21/2021  Today's healthcare provider: [name], MD   Chief Complaint  Patient presents with   Follow-up   Knee Pain   Subjective    HPI  Patient is here to follow up for x-rays done on bilateral knees. Patient advised to schedule a visit to talk options with orthopedic surgery.  There might be some options that are nonsurgical. He has mild bilateral lower extremity pain below the knees when he ambulates.  He just does not like getting up according to his wife and daughter.  No chest pain, edema or orthopnea. Medications: Outpatient Medications Prior to Visit  Medication Sig   amLODipine (NORVASC) 10 MG tablet Take 1 tablet (10 mg total) by mouth daily.   carvedilol (COREG) 12.5 MG tablet TAKE 1 TABLET BY MOUTH TWICE DAILY   Cholecalciferol (VITAMIN D) 2000 UNITS tablet Take 2,000 Units by mouth daily.    glimepiride (AMARYL) 4 MG tablet Take 1 tablet (4 mg total) by mouth daily.   hydrALAZINE (APRESOLINE) 100 MG tablet Take 1 tablet (100 mg total) by mouth 2 (two) times daily.   levothyroxine (SYNTHROID) 175 MCG tablet TAKE 1 TABLET BY MOUTH EVERY DAY   linagliptin (TRADJENTA) 5 MG TABS tablet Take 1 tablet (5 mg total) by mouth daily.   losartan (COZAAR) 100 MG tablet Take 1 tablet (100 mg total) by mouth daily.   meloxicam (MOBIC) 7.5 MG tablet Take 1 tablet (7.5 mg total) by mouth 2 (two) times daily.   Multiple Vitamin (MULTIVITAMIN) tablet Take 1 tablet by mouth daily.   potassium chloride SA (K-DUR,KLOR-CON) 20 MEQ tablet Take 1 tablet by mouth every other day.    sildenafil (VIAGRA) 100 MG tablet Take 100 mg by  mouth as needed.    simvastatin (ZOCOR) 20 MG tablet TAKE 1 TABLET(20 MG) BY MOUTH AT BEDTIME   torsemide (DEMADEX) 20 MG tablet Take 20 mg by mouth daily.   No facility-administered medications prior to visit.    Review of Systems  Constitutional:  Negative for appetite change, chills and fever.  Respiratory:  Negative for chest tightness, shortness of breath and wheezing.   Cardiovascular:  Negative for chest pain and palpitations.  Gastrointestinal:  Negative for abdominal pain, nausea and vomiting.   Last hemoglobin A1c Lab Results  Component Value Date   HGBA1C 8.0 (A) 02/13/2021       Objective    BP (!) 167/62 (BP Location: Right Arm, Patient Position: Sitting, Cuff Size: Large)    Pulse 65    Temp 98.2 F (36.8 C) (Temporal)    Resp 16    Ht 5\' 7"  (1.702 m)    Wt 208 lb (94.3 kg)    SpO2 98%    BMI 32.58 kg/m  BP Readings from Last 3 Encounters:  02/21/21 (!) 167/62  02/13/21 114/63  10/17/20 (!) 159/75   Wt Readings from Last 3 Encounters:  02/21/21 208 lb (94.3 kg)  02/13/21 208 lb (94.3 kg)  10/17/20 208 lb (94.3 kg)      Physical Exam Vitals and nursing note reviewed.  Constitutional:      Appearance:  Normal appearance. He is normal weight.  HENT:     Right Ear: Tympanic membrane normal.     Left Ear: Tympanic membrane normal.     Nose: Nose normal.     Mouth/Throat:     Mouth: Mucous membranes are moist.  Cardiovascular:     Rate and Rhythm: Normal rate and regular rhythm.     Pulses: Normal pulses.     Heart sounds: Normal heart sounds.  Pulmonary:     Effort: Pulmonary effort is normal.     Breath sounds: Normal breath sounds.  Abdominal:     Palpations: Abdomen is soft.  Musculoskeletal:     Cervical back: Neck supple.     Comments: He has no effusion or tenderness over his lower extremities.  Skin:    General: Skin is warm and dry.  Neurological:     General: No focal deficit present.     Mental Status: He is alert and oriented to person,  place, and time.  Psychiatric:        Mood and Affect: Mood normal.        Behavior: Behavior normal.        Thought Content: Thought content normal.        Judgment: Judgment normal.      No results found for any visits on 02/21/21.  Assessment & Plan     1. Primary osteoarthritis involving multiple joints Patient with arthritis of the knees ankles and feet.  Advised him to keep moving.  Try OTC turmeric daily and topical diclofenac gel.  Therapy may be of benefit.  Orthopedics may be of benefit in the future.  2. Essential (primary) hypertension Good control  3. Obstructive sleep apnea syndrome   4. Type 2 diabetes mellitus with stage 3a chronic kidney disease, without long-term current use of insulin (HCC) Under fairly good control for 88 year old   No follow-ups on file.      I, Randy Durie, MD, have reviewed all documentation for this visit. The documentation on 02/24/21 for the exam, diagnosis, procedures, and orders are all accurate and complete.    Riyah Bardon Cranford Mon, MD  [facility] [phone] (phone) [phone] (fax)  [place]

## 2021-02-22 DIAGNOSIS — I509 Heart failure, unspecified: Secondary | ICD-10-CM | POA: Diagnosis not present

## 2021-02-22 DIAGNOSIS — R809 Proteinuria, unspecified: Secondary | ICD-10-CM | POA: Diagnosis not present

## 2021-02-22 DIAGNOSIS — E1122 Type 2 diabetes mellitus with diabetic chronic kidney disease: Secondary | ICD-10-CM | POA: Diagnosis not present

## 2021-02-22 DIAGNOSIS — I129 Hypertensive chronic kidney disease with stage 1 through stage 4 chronic kidney disease, or unspecified chronic kidney disease: Secondary | ICD-10-CM | POA: Diagnosis not present

## 2021-02-22 DIAGNOSIS — I701 Atherosclerosis of renal artery: Secondary | ICD-10-CM | POA: Diagnosis not present

## 2021-02-22 DIAGNOSIS — N184 Chronic kidney disease, stage 4 (severe): Secondary | ICD-10-CM | POA: Diagnosis not present

## 2021-02-26 DIAGNOSIS — Z20822 Contact with and (suspected) exposure to covid-19: Secondary | ICD-10-CM | POA: Diagnosis not present

## 2021-02-28 DIAGNOSIS — I509 Heart failure, unspecified: Secondary | ICD-10-CM | POA: Diagnosis not present

## 2021-02-28 DIAGNOSIS — I701 Atherosclerosis of renal artery: Secondary | ICD-10-CM | POA: Diagnosis not present

## 2021-02-28 DIAGNOSIS — R809 Proteinuria, unspecified: Secondary | ICD-10-CM | POA: Diagnosis not present

## 2021-02-28 DIAGNOSIS — N2581 Secondary hyperparathyroidism of renal origin: Secondary | ICD-10-CM | POA: Diagnosis not present

## 2021-02-28 DIAGNOSIS — N184 Chronic kidney disease, stage 4 (severe): Secondary | ICD-10-CM | POA: Diagnosis not present

## 2021-02-28 DIAGNOSIS — Z20822 Contact with and (suspected) exposure to covid-19: Secondary | ICD-10-CM | POA: Diagnosis not present

## 2021-02-28 DIAGNOSIS — E1122 Type 2 diabetes mellitus with diabetic chronic kidney disease: Secondary | ICD-10-CM | POA: Diagnosis not present

## 2021-02-28 DIAGNOSIS — I129 Hypertensive chronic kidney disease with stage 1 through stage 4 chronic kidney disease, or unspecified chronic kidney disease: Secondary | ICD-10-CM | POA: Diagnosis not present

## 2021-03-06 DIAGNOSIS — G4730 Sleep apnea, unspecified: Secondary | ICD-10-CM | POA: Diagnosis not present

## 2021-03-06 DIAGNOSIS — I340 Nonrheumatic mitral (valve) insufficiency: Secondary | ICD-10-CM | POA: Diagnosis not present

## 2021-03-06 DIAGNOSIS — E782 Mixed hyperlipidemia: Secondary | ICD-10-CM | POA: Diagnosis not present

## 2021-03-06 DIAGNOSIS — I2510 Atherosclerotic heart disease of native coronary artery without angina pectoris: Secondary | ICD-10-CM | POA: Diagnosis not present

## 2021-03-06 DIAGNOSIS — I10 Essential (primary) hypertension: Secondary | ICD-10-CM | POA: Diagnosis not present

## 2021-03-06 DIAGNOSIS — E669 Obesity, unspecified: Secondary | ICD-10-CM | POA: Diagnosis not present

## 2021-03-12 ENCOUNTER — Other Ambulatory Visit: Payer: Self-pay | Admitting: Family Medicine

## 2021-03-14 ENCOUNTER — Ambulatory Visit: Payer: Medicare Other | Admitting: Family Medicine

## 2021-03-23 DIAGNOSIS — Z20822 Contact with and (suspected) exposure to covid-19: Secondary | ICD-10-CM | POA: Diagnosis not present

## 2021-04-03 DIAGNOSIS — Z20822 Contact with and (suspected) exposure to covid-19: Secondary | ICD-10-CM | POA: Diagnosis not present

## 2021-04-11 ENCOUNTER — Other Ambulatory Visit: Payer: Self-pay | Admitting: Family Medicine

## 2021-04-11 DIAGNOSIS — N1831 Chronic kidney disease, stage 3a: Secondary | ICD-10-CM

## 2021-04-11 DIAGNOSIS — E1122 Type 2 diabetes mellitus with diabetic chronic kidney disease: Secondary | ICD-10-CM

## 2021-04-12 DIAGNOSIS — Z20822 Contact with and (suspected) exposure to covid-19: Secondary | ICD-10-CM | POA: Diagnosis not present

## 2021-04-13 DIAGNOSIS — Z20822 Contact with and (suspected) exposure to covid-19: Secondary | ICD-10-CM | POA: Diagnosis not present

## 2021-04-20 DIAGNOSIS — Z20822 Contact with and (suspected) exposure to covid-19: Secondary | ICD-10-CM | POA: Diagnosis not present

## 2021-04-23 DIAGNOSIS — Z20822 Contact with and (suspected) exposure to covid-19: Secondary | ICD-10-CM | POA: Diagnosis not present

## 2021-04-30 DIAGNOSIS — Z20822 Contact with and (suspected) exposure to covid-19: Secondary | ICD-10-CM | POA: Diagnosis not present

## 2021-05-07 DIAGNOSIS — Z20822 Contact with and (suspected) exposure to covid-19: Secondary | ICD-10-CM | POA: Diagnosis not present

## 2021-05-14 ENCOUNTER — Other Ambulatory Visit: Payer: Self-pay | Admitting: Family Medicine

## 2021-05-14 DIAGNOSIS — Z20822 Contact with and (suspected) exposure to covid-19: Secondary | ICD-10-CM | POA: Diagnosis not present

## 2021-05-14 DIAGNOSIS — I10 Essential (primary) hypertension: Secondary | ICD-10-CM

## 2021-05-15 DIAGNOSIS — Z20822 Contact with and (suspected) exposure to covid-19: Secondary | ICD-10-CM | POA: Diagnosis not present

## 2021-05-28 ENCOUNTER — Other Ambulatory Visit: Payer: Self-pay | Admitting: Family Medicine

## 2021-05-29 NOTE — Telephone Encounter (Signed)
Requested Prescriptions  Pending Prescriptions Disp Refills  . glimepiride (AMARYL) 4 MG tablet [Pharmacy Med Name: GLIMEPIRIDE 4MG  TABLETS] 90 tablet 0    Sig: TAKE 1 TABLET(4 MG) BY MOUTH DAILY     Endocrinology:  Diabetes - Sulfonylureas Failed - 05/28/2021  1:52 PM      Failed - HBA1C is between 0 and 7.9 and within 180 days    Hemoglobin A1C  Date Value Ref Range Status  02/13/2021 8.0 (A) 4.0 - 5.6 % Final   Hgb A1c MFr Bld  Date Value Ref Range Status  06/24/2019 8.4 (H) 4.8 - 5.6 % Final    Comment:             Prediabetes: 5.7 - 6.4          Diabetes: >6.4          Glycemic control for adults with diabetes: <7.0          Failed - Cr in normal range and within 360 days    Creatinine, Ser  Date Value Ref Range Status  06/13/2020 1.85 (H) 0.76 - 1.27 mg/dL Final         Passed - Valid encounter within last 6 months    Recent Outpatient Visits          3 months ago Primary osteoarthritis involving multiple joints   [facility] Jerrol Banana., MD   3 months ago Type 2 diabetes mellitus with stage 3a chronic kidney disease, without long-term current use of insulin ([place])   [facility] Jerrol Banana., MD   7 months ago Type 2 diabetes mellitus with stage 3a chronic kidney disease, without long-term current use of insulin [facility])   [facility] Jerrol Banana., MD   11 months ago Type 2 diabetes mellitus with stage 3a chronic kidney disease, without long-term current use of insulin [facility])   [facility] Jerrol Banana., MD   1 year ago Type 2 diabetes mellitus with stage 3a chronic kidney disease, without long-term current use of insulin [facility])   [facility] Jerrol Banana., MD

## 2021-06-21 ENCOUNTER — Encounter: Payer: Medicare Other | Admitting: Family Medicine

## 2021-06-21 ENCOUNTER — Inpatient Hospital Stay (HOSPITAL_COMMUNITY)
Admit: 2021-06-21 | Discharge: 2021-06-21 | Disposition: A | Discharge status: Home / Self Care | Payer: Medicare Other | Attending: Internal Medicine | Admitting: Internal Medicine

## 2021-06-21 ENCOUNTER — Inpatient Hospital Stay
Admission: EM | Admit: 2021-06-21 | Discharge: 2021-06-26 | DRG: 871 | Disposition: A | Discharge status: Home Health | Payer: Medicare Other | Attending: Internal Medicine | Admitting: Internal Medicine

## 2021-06-21 ENCOUNTER — Other Ambulatory Visit: Payer: Self-pay

## 2021-06-21 ENCOUNTER — Emergency Department: Payer: Medicare Other

## 2021-06-21 ENCOUNTER — Telehealth: Payer: Self-pay

## 2021-06-21 DIAGNOSIS — I4819 Other persistent atrial fibrillation: Secondary | ICD-10-CM | POA: Diagnosis present

## 2021-06-21 DIAGNOSIS — Z20822 Contact with and (suspected) exposure to covid-19: Secondary | ICD-10-CM | POA: Diagnosis present

## 2021-06-21 DIAGNOSIS — Z79899 Other long term (current) drug therapy: Secondary | ICD-10-CM | POA: Diagnosis not present

## 2021-06-21 DIAGNOSIS — R7989 Other specified abnormal findings of blood chemistry: Secondary | ICD-10-CM | POA: Diagnosis present

## 2021-06-21 DIAGNOSIS — E669 Obesity, unspecified: Secondary | ICD-10-CM | POA: Diagnosis present

## 2021-06-21 DIAGNOSIS — J9601 Acute respiratory failure with hypoxia: Secondary | ICD-10-CM | POA: Diagnosis not present

## 2021-06-21 DIAGNOSIS — Z79890 Hormone replacement therapy (postmenopausal): Secondary | ICD-10-CM | POA: Diagnosis not present

## 2021-06-21 DIAGNOSIS — Z6835 Body mass index (BMI) 35.0-35.9, adult: Secondary | ICD-10-CM

## 2021-06-21 DIAGNOSIS — Z7984 Long term (current) use of oral hypoglycemic drugs: Secondary | ICD-10-CM | POA: Diagnosis not present

## 2021-06-21 DIAGNOSIS — Z91148 Patient's other noncompliance with medication regimen for other reason: Secondary | ICD-10-CM

## 2021-06-21 DIAGNOSIS — N184 Chronic kidney disease, stage 4 (severe): Secondary | ICD-10-CM | POA: Diagnosis not present

## 2021-06-21 DIAGNOSIS — J209 Acute bronchitis, unspecified: Secondary | ICD-10-CM | POA: Diagnosis not present

## 2021-06-21 DIAGNOSIS — E7800 Pure hypercholesterolemia, unspecified: Secondary | ICD-10-CM | POA: Diagnosis not present

## 2021-06-21 DIAGNOSIS — Z66 Do not resuscitate: Secondary | ICD-10-CM | POA: Diagnosis present

## 2021-06-21 DIAGNOSIS — J9801 Acute bronchospasm: Secondary | ICD-10-CM | POA: Diagnosis not present

## 2021-06-21 DIAGNOSIS — I10 Essential (primary) hypertension: Secondary | ICD-10-CM | POA: Diagnosis present

## 2021-06-21 DIAGNOSIS — I110 Hypertensive heart disease with heart failure: Secondary | ICD-10-CM | POA: Diagnosis not present

## 2021-06-21 DIAGNOSIS — Z91119 Patient's noncompliance with dietary regimen due to unspecified reason: Secondary | ICD-10-CM

## 2021-06-21 DIAGNOSIS — E1165 Type 2 diabetes mellitus with hyperglycemia: Secondary | ICD-10-CM | POA: Diagnosis present

## 2021-06-21 DIAGNOSIS — R0689 Other abnormalities of breathing: Secondary | ICD-10-CM | POA: Diagnosis not present

## 2021-06-21 DIAGNOSIS — Z833 Family history of diabetes mellitus: Secondary | ICD-10-CM

## 2021-06-21 DIAGNOSIS — Z886 Allergy status to analgesic agent status: Secondary | ICD-10-CM | POA: Diagnosis not present

## 2021-06-21 DIAGNOSIS — E1122 Type 2 diabetes mellitus with diabetic chronic kidney disease: Secondary | ICD-10-CM | POA: Diagnosis present

## 2021-06-21 DIAGNOSIS — A419 Sepsis, unspecified organism: Secondary | ICD-10-CM | POA: Diagnosis not present

## 2021-06-21 DIAGNOSIS — Z91199 Patient's noncompliance with other medical treatment and regimen due to unspecified reason: Secondary | ICD-10-CM

## 2021-06-21 DIAGNOSIS — R0602 Shortness of breath: Secondary | ICD-10-CM | POA: Diagnosis not present

## 2021-06-21 DIAGNOSIS — Z841 Family history of disorders of kidney and ureter: Secondary | ICD-10-CM

## 2021-06-21 DIAGNOSIS — I5033 Acute on chronic diastolic (congestive) heart failure: Secondary | ICD-10-CM | POA: Diagnosis present

## 2021-06-21 DIAGNOSIS — E785 Hyperlipidemia, unspecified: Secondary | ICD-10-CM | POA: Diagnosis not present

## 2021-06-21 DIAGNOSIS — E039 Hypothyroidism, unspecified: Secondary | ICD-10-CM | POA: Diagnosis not present

## 2021-06-21 DIAGNOSIS — R778 Other specified abnormalities of plasma proteins: Secondary | ICD-10-CM | POA: Diagnosis not present

## 2021-06-21 DIAGNOSIS — J90 Pleural effusion, not elsewhere classified: Secondary | ICD-10-CM | POA: Diagnosis not present

## 2021-06-21 DIAGNOSIS — Z885 Allergy status to narcotic agent status: Secondary | ICD-10-CM | POA: Diagnosis not present

## 2021-06-21 DIAGNOSIS — Z8249 Family history of ischemic heart disease and other diseases of the circulatory system: Secondary | ICD-10-CM

## 2021-06-21 DIAGNOSIS — I5031 Acute diastolic (congestive) heart failure: Secondary | ICD-10-CM | POA: Diagnosis not present

## 2021-06-21 DIAGNOSIS — T68XXXA Hypothermia, initial encounter: Secondary | ICD-10-CM | POA: Diagnosis present

## 2021-06-21 DIAGNOSIS — I4891 Unspecified atrial fibrillation: Secondary | ICD-10-CM | POA: Diagnosis not present

## 2021-06-21 DIAGNOSIS — J9621 Acute and chronic respiratory failure with hypoxia: Secondary | ICD-10-CM

## 2021-06-21 DIAGNOSIS — G4733 Obstructive sleep apnea (adult) (pediatric): Secondary | ICD-10-CM | POA: Diagnosis present

## 2021-06-21 DIAGNOSIS — R404 Transient alteration of awareness: Secondary | ICD-10-CM | POA: Diagnosis not present

## 2021-06-21 DIAGNOSIS — E1129 Type 2 diabetes mellitus with other diabetic kidney complication: Secondary | ICD-10-CM | POA: Diagnosis present

## 2021-06-21 DIAGNOSIS — I130 Hypertensive heart and chronic kidney disease with heart failure and stage 1 through stage 4 chronic kidney disease, or unspecified chronic kidney disease: Secondary | ICD-10-CM | POA: Diagnosis present

## 2021-06-21 DIAGNOSIS — R680 Hypothermia, not associated with low environmental temperature: Secondary | ICD-10-CM | POA: Diagnosis not present

## 2021-06-21 DIAGNOSIS — R0902 Hypoxemia: Secondary | ICD-10-CM | POA: Diagnosis not present

## 2021-06-21 DIAGNOSIS — I509 Heart failure, unspecified: Secondary | ICD-10-CM | POA: Diagnosis not present

## 2021-06-21 DIAGNOSIS — R062 Wheezing: Secondary | ICD-10-CM | POA: Diagnosis not present

## 2021-06-21 LAB — RESPIRATORY PANEL BY PCR

## 2021-06-21 LAB — SARS CORONAVIRUS 2 BY RT PCR: SARS Coronavirus 2 by RT PCR: NEGATIVE

## 2021-06-21 LAB — COMPREHENSIVE METABOLIC PANEL
ALT: 16 U/L (ref 0–44)
AST: 15 U/L (ref 15–41)
Albumin: 4 g/dL (ref 3.5–5.0)
Alkaline Phosphatase: 80 U/L (ref 38–126)
Anion gap: 7 (ref 5–15)
BUN: 49 mg/dL — ABNORMAL HIGH (ref 8–23)
CO2: 22 mmol/L (ref 22–32)
Calcium: 9.6 mg/dL (ref 8.9–10.3)
Chloride: 109 mmol/L (ref 98–111)
Creatinine, Ser: 1.92 mg/dL — ABNORMAL HIGH (ref 0.61–1.24)
GFR, Estimated: 32 mL/min — ABNORMAL LOW (ref >60)
Glucose, Bld: 274 mg/dL — ABNORMAL HIGH (ref 70–99)
Potassium: 4.8 mmol/L (ref 3.5–5.1)
Sodium: 138 mmol/L (ref 135–145)
Total Bilirubin: 1.7 mg/dL — ABNORMAL HIGH (ref 0.3–1.2)
Total Protein: 7.2 g/dL (ref 6.5–8.1)

## 2021-06-21 LAB — ECHOCARDIOGRAM COMPLETE
AR max vel: 1.57 cm2
AV Area VTI: 1.66 cm2
AV Area mean vel: 1.52 cm2
AV Mean grad: 8.5 mmHg
AV Peak grad: 15.6 mmHg
Ao pk vel: 1.98 m/s
Area-P 1/2: 3.53 cm2
Height: 64 in
MV VTI: 2.52 cm2
S' Lateral: 2.29 cm
Weight: 3347.46 oz

## 2021-06-21 LAB — GLUCOSE, CAPILLARY
Glucose-Capillary: 261 mg/dL — ABNORMAL HIGH (ref 70–99)
Glucose-Capillary: 216 mg/dL — ABNORMAL HIGH (ref 70–99)
Glucose-Capillary: 309 mg/dL — ABNORMAL HIGH (ref 70–99)

## 2021-06-21 LAB — CBC WITH DIFFERENTIAL/PLATELET
Abs Immature Granulocytes: 0.12 10*3/uL — ABNORMAL HIGH (ref 0.00–0.07)
Basophils Absolute: 0.1 10*3/uL (ref 0.0–0.1)
Basophils Relative: 1 %
Eosinophils Absolute: 0.1 10*3/uL (ref 0.0–0.5)
Eosinophils Relative: 0 %
HCT: 38.1 % — ABNORMAL LOW (ref 39.0–52.0)
Hemoglobin: 12.5 g/dL — ABNORMAL LOW (ref 13.0–17.0)
Immature Granulocytes: 1 %
Lymphocytes Relative: 5 %
Lymphs Abs: 1.1 10*3/uL (ref 0.7–4.0)
MCH: 31.4 pg (ref 26.0–34.0)
MCHC: 32.8 g/dL (ref 30.0–36.0)
MCV: 95.7 fL (ref 80.0–100.0)
Monocytes Absolute: 1 10*3/uL (ref 0.1–1.0)
Monocytes Relative: 5 %
Neutro Abs: 17.4 10*3/uL — ABNORMAL HIGH (ref 1.7–7.7)
Neutrophils Relative %: 88 %
Platelets: 179 10*3/uL (ref 150–400)
RBC: 3.98 MIL/uL — ABNORMAL LOW (ref 4.22–5.81)
RDW: 14 % (ref 11.5–15.5)
WBC: 19.7 10*3/uL — ABNORMAL HIGH (ref 4.0–10.5)
nRBC: 0 % (ref 0.0–0.2)

## 2021-06-21 LAB — LACTIC ACID, PLASMA
Lactic Acid, Venous: 1.4 mmol/L (ref 0.5–1.9)
Lactic Acid, Venous: 1.2 mmol/L (ref 0.5–1.9)

## 2021-06-21 LAB — TROPONIN I (HIGH SENSITIVITY)
Troponin I (High Sensitivity): 24 ng/L — ABNORMAL HIGH (ref <18)
Troponin I (High Sensitivity): 24 ng/L — ABNORMAL HIGH (ref <18)

## 2021-06-21 LAB — PROTIME-INR
INR: 1.2 (ref 0.8–1.2)
Prothrombin Time: 15.2 seconds (ref 11.4–15.2)

## 2021-06-21 LAB — PROCALCITONIN: Procalcitonin: 0.24 ng/mL

## 2021-06-21 LAB — BRAIN NATRIURETIC PEPTIDE: B Natriuretic Peptide: 562 pg/mL — ABNORMAL HIGH (ref 0.0–100.0)

## 2021-06-21 LAB — MRSA NEXT GEN BY PCR, NASAL: MRSA by PCR Next Gen: NOT DETECTED

## 2021-06-21 MED ORDER — SODIUM CHLORIDE 0.9 % IV SOLN
500.0000 mg | Freq: Once | INTRAVENOUS | Status: AC
  Administered 2021-06-21: 500 mg via INTRAVENOUS
  Filled 2021-06-21: qty 5

## 2021-06-21 MED ORDER — SODIUM CHLORIDE 0.9 % IV SOLN: 1.0000 g | INTRAVENOUS | Status: DC

## 2021-06-21 MED ORDER — METHYLPREDNISOLONE SODIUM SUCC 125 MG IJ SOLR
125.0000 mg | Freq: Once | INTRAMUSCULAR | Status: AC
  Administered 2021-06-21: 125 mg via INTRAVENOUS
  Filled 2021-06-21: qty 2

## 2021-06-21 MED ORDER — HEPARIN (PORCINE) 25000 UT/250ML-% IV SOLN
900.0000 [IU]/h | INTRAVENOUS | Status: DC
Start: 2021-06-21 — End: 2021-06-21

## 2021-06-21 MED ORDER — ONDANSETRON HCL 4 MG/2ML IJ SOLN: 4.0000 mg | Freq: Three times a day (TID) | INTRAMUSCULAR | Status: DC | PRN

## 2021-06-21 MED ORDER — HEPARIN BOLUS VIA INFUSION
4000.0000 [IU] | Freq: Once | INTRAVENOUS | Status: AC
Start: 2021-06-21 — End: 2021-06-21
  Administered 2021-06-21: 4000 [IU] via INTRAVENOUS
  Filled 2021-06-21: qty 4000

## 2021-06-21 MED ORDER — ACETAMINOPHEN 325 MG PO TABS: 650.0000 mg | ORAL_TABLET | Freq: Four times a day (QID) | ORAL | Status: DC | PRN

## 2021-06-21 MED ORDER — FUROSEMIDE 10 MG/ML IJ SOLN
80.0000 mg | Freq: Once | INTRAMUSCULAR | Status: AC
  Administered 2021-06-21: 80 mg via INTRAVENOUS
  Filled 2021-06-21: qty 8

## 2021-06-21 MED ORDER — SODIUM CHLORIDE 0.9 % IV SOLN
1.0000 g | Freq: Once | INTRAVENOUS | Status: AC
  Administered 2021-06-21: 1 g via INTRAVENOUS
  Filled 2021-06-21: qty 10

## 2021-06-21 MED ORDER — VITAMIN D 25 MCG (1000 UNIT) PO TABS
2000.0000 [IU] | ORAL_TABLET | Freq: Every day | ORAL | Status: DC
  Administered 2021-06-22 – 2021-06-26 (×5): 2000 [IU] via ORAL
  Filled 2021-06-21 (×5): qty 2

## 2021-06-21 MED ORDER — INSULIN ASPART 100 UNIT/ML IJ SOLN
0.0000 [IU] | Freq: Every day | INTRAMUSCULAR | Status: DC
  Administered 2021-06-21: 4 [IU] via SUBCUTANEOUS
  Administered 2021-06-22 – 2021-06-24 (×3): 5 [IU] via SUBCUTANEOUS
  Administered 2021-06-25: 3 [IU] via SUBCUTANEOUS
  Filled 2021-06-21 (×5): qty 1

## 2021-06-21 MED ORDER — LEVOTHYROXINE SODIUM 50 MCG PO TABS
175.0000 ug | ORAL_TABLET | Freq: Every day | ORAL | Status: DC
  Administered 2021-06-22 – 2021-06-26 (×5): 175 ug via ORAL
  Filled 2021-06-21 (×3): qty 1
  Filled 2021-06-21: qty 2
  Filled 2021-06-21: qty 1

## 2021-06-21 MED ORDER — DM-GUAIFENESIN ER 30-600 MG PO TB12: 1.0000 | ORAL_TABLET | Freq: Two times a day (BID) | ORAL | Status: DC | PRN

## 2021-06-21 MED ORDER — HEPARIN BOLUS VIA INFUSION: 4000.0000 [IU] | Freq: Once | INTRAVENOUS | Status: DC

## 2021-06-21 MED ORDER — IPRATROPIUM-ALBUTEROL 0.5-2.5 (3) MG/3ML IN SOLN
3.0000 mL | RESPIRATORY_TRACT | Status: DC
  Administered 2021-06-21 – 2021-06-22 (×6): 3 mL via RESPIRATORY_TRACT
  Filled 2021-06-21 (×5): qty 3

## 2021-06-21 MED ORDER — SODIUM CHLORIDE 0.9 % IV SOLN
2.0000 g | INTRAVENOUS | Status: AC
  Administered 2021-06-22 – 2021-06-25 (×4): 2 g via INTRAVENOUS
  Filled 2021-06-21 (×2): qty 2
  Filled 2021-06-21: qty 20
  Filled 2021-06-21: qty 2

## 2021-06-21 MED ORDER — AZITHROMYCIN 500 MG IV SOLR
500.0000 mg | INTRAVENOUS | Status: DC
  Administered 2021-06-22: 500 mg via INTRAVENOUS
  Filled 2021-06-21: qty 500

## 2021-06-21 MED ORDER — HEPARIN (PORCINE) 25000 UT/250ML-% IV SOLN
950.0000 [IU]/h | INTRAVENOUS | Status: DC
  Administered 2021-06-21 – 2021-06-22 (×2): 950 [IU]/h via INTRAVENOUS
  Filled 2021-06-21 (×2): qty 250

## 2021-06-21 MED ORDER — METHYLPREDNISOLONE SODIUM SUCC 125 MG IJ SOLR
80.0000 mg | Freq: Every day | INTRAMUSCULAR | Status: DC
Start: 2021-06-22 — End: 2021-06-24
  Administered 2021-06-22 – 2021-06-24 (×3): 80 mg via INTRAVENOUS
  Filled 2021-06-21 (×3): qty 2

## 2021-06-21 MED ORDER — FUROSEMIDE 10 MG/ML IJ SOLN
80.0000 mg | Freq: Two times a day (BID) | INTRAMUSCULAR | Status: DC
  Administered 2021-06-21 – 2021-06-22 (×2): 80 mg via INTRAVENOUS
  Filled 2021-06-21 (×2): qty 8

## 2021-06-21 MED ORDER — ALBUTEROL SULFATE (2.5 MG/3ML) 0.083% IN NEBU: 2.5000 mg | INHALATION_SOLUTION | RESPIRATORY_TRACT | Status: DC | PRN

## 2021-06-21 MED ORDER — IPRATROPIUM-ALBUTEROL 0.5-2.5 (3) MG/3ML IN SOLN
2.0000 mL | Freq: Once | RESPIRATORY_TRACT | Status: AC
  Administered 2021-06-21: 2 mL via RESPIRATORY_TRACT
  Filled 2021-06-21: qty 3

## 2021-06-21 MED ORDER — ENOXAPARIN SODIUM 40 MG/0.4ML IJ SOSY: 40.0000 mg | PREFILLED_SYRINGE | INTRAMUSCULAR | Status: DC

## 2021-06-21 MED ORDER — INSULIN ASPART 100 UNIT/ML IJ SOLN
0.0000 [IU] | Freq: Three times a day (TID) | INTRAMUSCULAR | Status: DC
  Administered 2021-06-21: 3 [IU] via SUBCUTANEOUS
  Administered 2021-06-21: 5 [IU] via SUBCUTANEOUS
  Administered 2021-06-22: 9 [IU] via SUBCUTANEOUS
  Administered 2021-06-22: 7 [IU] via SUBCUTANEOUS
  Administered 2021-06-22: 5 [IU] via SUBCUTANEOUS
  Administered 2021-06-23: 7 [IU] via SUBCUTANEOUS
  Administered 2021-06-23: 5 [IU] via SUBCUTANEOUS
  Administered 2021-06-23: 9 [IU] via SUBCUTANEOUS
  Administered 2021-06-24: 7 [IU] via SUBCUTANEOUS
  Administered 2021-06-24: 9 [IU] via SUBCUTANEOUS
  Filled 2021-06-21 (×10): qty 1

## 2021-06-21 MED ORDER — ADULT MULTIVITAMIN W/MINERALS CH
1.0000 | ORAL_TABLET | Freq: Every day | ORAL | Status: DC
  Administered 2021-06-22 – 2021-06-26 (×5): 1 via ORAL
  Filled 2021-06-21 (×5): qty 1

## 2021-06-21 MED ORDER — HYDRALAZINE HCL 20 MG/ML IJ SOLN
5.0000 mg | INTRAMUSCULAR | Status: DC | PRN
Start: 2021-06-21 — End: 2021-06-26

## 2021-06-21 MED ORDER — SIMVASTATIN 20 MG PO TABS
20.0000 mg | ORAL_TABLET | Freq: Every day | ORAL | Status: DC
  Administered 2021-06-21 – 2021-06-25 (×5): 20 mg via ORAL
  Filled 2021-06-21 (×5): qty 1

## 2021-06-21 MED ORDER — CARVEDILOL 12.5 MG PO TABS: 12.5000 mg | ORAL_TABLET | Freq: Two times a day (BID) | ORAL | Status: DC

## 2021-06-21 MED ORDER — ORAL CARE MOUTH RINSE: 15.0000 mL | OROMUCOSAL | Status: DC | PRN

## 2021-06-21 MED ORDER — ORAL CARE MOUTH RINSE
15.0000 mL | OROMUCOSAL | Status: DC
  Administered 2021-06-21 – 2021-06-22 (×3): 15 mL via OROMUCOSAL

## 2021-06-21 NOTE — ED Notes (Signed)
Daughter at bedside with wife. Pt resting comfortably in bed.

## 2021-06-21 NOTE — H&P (Signed)
History and Physical    Randy Dalton:[id number] DOB: 10-27-1927 DOA: 06/21/2021  Referring MD/NP/PA:   PCP: Jerrol Banana., MD   Patient coming from:  The patient is coming from home.   Chief Complaint: SOB  HPI: Randy Dalton is a 88 y.o. male with medical history significant of hypertension, hyperlipidemia, diabetes mellitus, GERD, hypothyroidism, OSA on CPAP, CKD-4, CHF, presents with shortness of breath.  Per patient's daughter and wife at the bedside, patient's shortness of breath started in the early morning at about 3 AM, which has been progressively worsening.  Patient does not have chest pain or cough.  He has audible wheezing.  No fever or chills. Patient is not wearing oxygen normally. Patient was found to have oxygen desaturation to 60s% on room air, with severe respiratory distress, cannot speak in full sentence, using accessory muscle for breathing.  Initially 4 L oxygen was started with saturation 90s, then BiPAP started in ED.  Patient does not have nausea vomiting, diarrhea or abdominal pain.  No symptoms of UTI.  Per her daughter, patient is supposed to use CPAP, but has not been using it for more than 3 months.  They are waiting for a new machine coming.   Data Reviewed and ED Course: pt was found to have BNP 562, troponin level 24, stable renal function, pending COVID PCR, hypothermia with temperature 94.3, blood pressure 113/59, heart rate 59, RR 26, VBG with pH 7.23, CO2 51, O2 60.  Chest x-ray showed cardiomegaly with interstitial opacity.  Patient is admitted to stepdown bed as inpatient   EKG: I have personally reviewed.  Sinus rhythm, QTc 467, LAD, PVC, nonspecific T wave change   Review of Systems:   General: no fevers, chills, has fatigue HEENT: no blurry vision, hearing changes or sore throat Respiratory: has dyspnea,  wheezing, no coughing, CV: no chest pain, no palpitations GI: no nausea, vomiting, abdominal pain, diarrhea, constipation GU: no  dysuria, burning on urination, increased urinary frequency, hematuria  Ext: has leg edema Neuro: no unilateral weakness, numbness, or tingling, no vision change or hearing loss Skin: no rash, no skin tear. MSK: No muscle spasm, no deformity, no limitation of range of movement in spin Heme: No easy bruising.  Travel history: No recent long distant travel.   Allergy:  Allergies  Allergen Reactions   Aspirin Other (See Comments)    Feel bad Feel bad   Codeine Nausea Only    Past Medical History:  Diagnosis Date   CHF (congestive heart failure) (HCC)    Chronic kidney disease    Diabetes mellitus without complication (HCC)    Diverticulosis    GERD (gastroesophageal reflux disease)    Hypercholesteremia    Hypertension    Hypothyroidism    Obstructive sleep apnea    CPAP   Wears dentures    partial top    Past Surgical History:  Procedure Laterality Date   BACK SURGERY     CATARACT EXTRACTION W/PHACO Left 10/24/2014   Procedure: CATARACT EXTRACTION PHACO AND INTRAOCULAR LENS PLACEMENT ([place]);  Surgeon: Ronnell Freshwater, MD;  Location: [place];  Service: Ophthalmology;  Laterality: Left;  DIABETIC - oral meds   CATARACT EXTRACTION W/PHACO Right 06/17/2017   Procedure: CATARACT EXTRACTION PHACO AND INTRAOCULAR LENS PLACEMENT ([facility]);  Surgeon: Birder Robson, MD;  Location: [facility];  Service: Ophthalmology;  Laterality: Right;  [place] 01:43 AP% 19.7 CDE 20.48 Fluid pack lot # [id number]   COLONOSCOPY  HERNIA REPAIR     PILONIDAL CYST EXCISION     STOMACH SURGERY      Social History:  reports that he has never smoked. He has never used smokeless tobacco. He reports that he does not drink alcohol and does not use drugs.  Family History:  Family History  Problem Relation Age of Onset   Hypertension Mother    Kidney failure Mother    Diabetes Father    Lung cancer Brother    Colon cancer Brother    Cancer Brother        skin, removed   Dementia  Sister    Dementia Sister    Hypertension Sister      Prior to Admission medications   Medication Sig Start Date End Date Taking? Authorizing Provider  amLODipine (NORVASC) 10 MG tablet TAKE 1 TABLET(10 MG) BY MOUTH DAILY 05/14/21   Jerrol Banana., MD  carvedilol (COREG) 12.5 MG tablet TAKE 1 TABLET BY MOUTH TWICE DAILY 07/04/17   Jerrol Banana., MD  Cholecalciferol (VITAMIN D) 2000 UNITS tablet Take 2,000 Units by mouth daily.  11/08/10   [provider]  glimepiride (AMARYL) 4 MG tablet TAKE 1 TABLET(4 MG) BY MOUTH DAILY 05/29/21   Jerrol Banana., MD  hydrALAZINE (APRESOLINE) 100 MG tablet Take 1 tablet (100 mg total) by mouth 2 (two) times daily. 03/01/20   Jerrol Banana., MD  levothyroxine (SYNTHROID) 175 MCG tablet TAKE 1 TABLET BY MOUTH EVERY DAY 09/15/20   Jerrol Banana., MD  losartan (COZAAR) 100 MG tablet Take 1 tablet (100 mg total) by mouth daily. 10/28/20   Jerrol Banana., MD  meloxicam (MOBIC) 7.5 MG tablet Take 1 tablet (7.5 mg total) by mouth 2 (two) times daily. 01/27/18   Jerrol Banana., MD  Multiple Vitamin (MULTIVITAMIN) tablet Take 1 tablet by mouth daily.    [provider]  potassium chloride SA (K-DUR,KLOR-CON) 20 MEQ tablet Take 1 tablet by mouth every other day.  02/03/17   [provider]  sildenafil (VIAGRA) 100 MG tablet Take 100 mg by mouth as needed.  10/09/13   [provider]  simvastatin (ZOCOR) 20 MG tablet TAKE 1 TABLET(20 MG) BY MOUTH AT BEDTIME 03/13/21   Jerrol Banana., MD  torsemide (DEMADEX) 20 MG tablet Take 20 mg by mouth daily. 02/21/17   [provider]  TRADJENTA 5 MG TABS tablet TAKE 1 TABLET(5 MG) BY MOUTH DAILY 04/12/21   Jerrol Banana., MD    Physical Exam: Vitals:   06/21/21 1400 06/21/21 1500 06/21/21 1528 06/21/21 1553  BP: 113/61 (!) 148/69    Pulse: 63 72 79 84  Resp: 18 (!) 32 (!) 21 19  Temp:  97.9 F (36.6 C)    TempSrc:       SpO2: 91% 96% 97% 94%  Weight:      Height:       General: Not in acute distress HEENT:       Eyes: PERRL, EOMI, no scleral icterus.       ENT: No discharge from the ears and nose, no pharynx injection, no tonsillar enlargement.        Neck: Difficult to assess JVD due to obesity, no bruit, no mass felt. Heme: No neck lymph node enlargement. Cardiac: S1/S2, RRR, No murmurs, No gallops or rubs. Respiratory: Has wheezing and fine crackles bilaterally GI: Soft, nondistended, nontender, no rebound pain, no organomegaly, BS present.  GU: No hematuria Ext: 2+ pitting leg edema bilaterally. 1+DP/PT pulse bilaterally. Musculoskeletal: No joint deformities, No joint redness or warmth, no limitation of ROM in spin. Skin: No rashes.  Neuro: Alert, oriented X3, cranial nerves II-XII grossly intact, moves all extremities. Psych: Patient is not psychotic, no suicidal or hemocidal ideation.  Labs on Admission: I have personally reviewed following labs and imaging studies  CBC: Recent Labs  Lab 06/21/21 0920  WBC 19.7*  NEUTROABS 17.4*  HGB 12.5*  HCT 38.1*  MCV 95.7  PLT 680   Basic Metabolic Panel: Recent Labs  Lab 06/21/21 0920  NA 138  K 4.8  CL 109  CO2 22  GLUCOSE 274*  BUN 49*  CREATININE 1.92*  CALCIUM 9.6   GFR: Estimated Creatinine Clearance: 25.5 mL/min (A) (by C-G formula based on SCr of 1.92 mg/dL (H)). Liver Function Tests: Recent Labs  Lab 06/21/21 0920  AST 15  ALT 16  ALKPHOS 80  BILITOT 1.7*  PROT 7.2  ALBUMIN 4.0   No results for input(s): "LIPASE", "AMYLASE" in the last 168 hours. No results for input(s): "AMMONIA" in the last 168 hours. Coagulation Profile: Recent Labs  Lab 06/21/21 0920  INR 1.2   Cardiac Enzymes: No results for input(s): "CKTOTAL", "CKMB", "CKMBINDEX", "TROPONINI" in the last 168 hours. BNP (last 3 results) No results for input(s): "PROBNP" in the last 8760 hours. HbA1C: No results for input(s): "HGBA1C" in the last 72  hours. CBG: Recent Labs  Lab 06/21/21 1128 06/21/21 1550  GLUCAP 261* 216*   Lipid Profile: No results for input(s): "CHOL", "HDL", "LDLCALC", "TRIG", "CHOLHDL", "LDLDIRECT" in the last 72 hours. Thyroid Function Tests: No results for input(s): "TSH", "T4TOTAL", "FREET4", "T3FREE", "THYROIDAB" in the last 72 hours. Anemia Panel: No results for input(s): "VITAMINB12", "FOLATE", "FERRITIN", "TIBC", "IRON", "RETICCTPCT" in the last 72 hours. Urine analysis: No results found for: "COLORURINE", "APPEARANCEUR", "LABSPEC", "PHURINE", "GLUCOSEU", "HGBUR", "BILIRUBINUR", "KETONESUR", "PROTEINUR", "UROBILINOGEN", "NITRITE", "LEUKOCYTESUR" Sepsis Labs: @LABRCNTIP (procalcitonin:4,lacticidven:4) ) Recent Results (from the past 240 hour(s))  SARS Coronavirus 2 by RT PCR (hospital order, performed in [facility] hospital lab) *cepheid single result test* Anterior Nasal Swab     Status: None   Collection Time: 06/21/21  9:20 AM   Specimen: Anterior Nasal Swab  Result Value Ref Range Status   SARS Coronavirus 2 by RT PCR NEGATIVE NEGATIVE Final    Comment: (NOTE) SARS-CoV-2 target nucleic acids are NOT DETECTED.  The SARS-CoV-2 RNA is generally detectable in upper and lower respiratory specimens during the acute phase of infection. The lowest concentration of SARS-CoV-2 viral copies this assay can detect is 250 copies / mL. A negative result does not preclude SARS-CoV-2 infection and should not be used as the sole basis for treatment or other patient management decisions.  A negative result may occur with improper specimen collection / handling, submission of specimen other than nasopharyngeal swab, presence of viral mutation(s) within the areas targeted by this assay, and inadequate number of viral copies (<250 copies / mL). A negative result must be combined with clinical observations, patient history, and epidemiological information.  Fact Sheet for Patients:    https://www.patel.info/  Fact Sheet for Healthcare Providers: https://hall.com/  This test is not yet approved or  cleared by the [place] FDA and has been authorized for detection and/or diagnosis of SARS-CoV-2 by FDA under an Emergency Use Authorization (EUA).  This EUA will remain in effect (meaning this test can be used) for the duration of the COVID-19 declaration under Section  564(b)(1) of the Act, 21 U.S.C. section 360bbb-3(b)(1), unless the authorization is terminated or revoked sooner.  Performed at [facility], [place], [place] [address]   Respiratory (~20 pathogens) panel by PCR     Status: None   Collection Time: 06/21/21 12:45 PM   Specimen: Nasopharyngeal Swab; Respiratory  Result Value Ref Range Status   Adenovirus NOT DETECTED NOT DETECTED Final   Coronavirus 229E NOT DETECTED NOT DETECTED Final    Comment: (NOTE) The Coronavirus on the Respiratory Panel, DOES NOT test for the novel  Coronavirus (2019 nCoV)    Coronavirus HKU1 NOT DETECTED NOT DETECTED Final   Coronavirus NL63 NOT DETECTED NOT DETECTED Final   Coronavirus OC43 NOT DETECTED NOT DETECTED Final   Metapneumovirus NOT DETECTED NOT DETECTED Final   Rhinovirus / Enterovirus NOT DETECTED NOT DETECTED Final   Influenza A NOT DETECTED NOT DETECTED Final   Influenza B NOT DETECTED NOT DETECTED Final   Parainfluenza Virus 1 NOT DETECTED NOT DETECTED Final   Parainfluenza Virus 2 NOT DETECTED NOT DETECTED Final   Parainfluenza Virus 3 NOT DETECTED NOT DETECTED Final   Parainfluenza Virus 4 NOT DETECTED NOT DETECTED Final   Respiratory Syncytial Virus NOT DETECTED NOT DETECTED Final   Bordetella pertussis NOT DETECTED NOT DETECTED Final   Bordetella Parapertussis NOT DETECTED NOT DETECTED Final   Chlamydophila pneumoniae NOT DETECTED NOT DETECTED Final   Mycoplasma pneumoniae NOT DETECTED NOT DETECTED Final    Comment: Performed at  [facility], [place]. [address], [place], [place] [address]  MRSA Next Gen by PCR, Nasal     Status: None   Collection Time: 06/21/21 12:45 PM   Specimen: Nasal Mucosa; Nasal Swab  Result Value Ref Range Status   MRSA by PCR Next Gen NOT DETECTED NOT DETECTED Final    Comment: (NOTE) The GeneXpert MRSA Assay (FDA approved for NASAL specimens only), is one component of a comprehensive MRSA colonization surveillance program. It is not intended to diagnose MRSA infection nor to guide or monitor treatment for MRSA infections. Test performance is not FDA approved in patients less than 69 years old. Performed at [facility], [address] [place], [place] [address]      Radiological Exams on Admission: ECHOCARDIOGRAM COMPLETE  Result Date: 06/21/2021    ECHOCARDIOGRAM REPORT   Patient Name:   BENJI POYNTER Date of Exam: 06/21/2021 Medical Rec #:  [id number]     Height:       64.0 in Accession #:    [id number]    Weight:       209.2 lb Date of Birth:  Nov 30, 1927     BSA:          1.994 m Patient Age:    23 years      BP:           110/53 mmHg Patient Gender: M             HR:           63 bpm. Exam Location:  [facility] Procedure: 2D Echo, Color Doppler and Cardiac Doppler Indications:     I50.31 congestive heart failure-Acute Diastolic  History:         Patient has prior history of Echocardiogram examinations. CHF,                  CKD; Risk Factors:Hypertension, Diabetes, HCL and Sleep Apnea.  Sonographer:     Charmayne Sheer Referring Phys:  [phone]  Soledad Gerlach Kemyah Buser Diagnosing Phys: Ida Rogue MD  Sonographer Comments: Suboptimal apical window. IMPRESSIONS  1. Left ventricular ejection fraction, by estimation, is 60 to 65%. The left ventricle has normal function. The left ventricle has no regional wall motion abnormalities. Left ventricular diastolic parameters are indeterminate.  2. Right ventricular systolic function is normal. The right ventricular size is normal. There is mildly elevated pulmonary  artery systolic pressure. The estimated right ventricular systolic pressure is 33.5 mmHg.  3. The mitral valve is normal in structure. Mild mitral valve regurgitation. No evidence of mitral stenosis.  4. Tricuspid valve regurgitation is mild to moderate.  5. The aortic valve is normal in structure. Aortic valve regurgitation is not visualized. Aortic valve sclerosis/calcification is present, without any evidence of aortic stenosis.  6. Mild pulmonic stenosis.  7. The inferior vena cava is normal in size with greater than 50% respiratory variability, suggesting right atrial pressure of 3 mmHg. FINDINGS  Left Ventricle: Left ventricular ejection fraction, by estimation, is 60 to 65%. The left ventricle has normal function. The left ventricle has no regional wall motion abnormalities. The left ventricular internal cavity size was normal in size. There is  no left ventricular hypertrophy. Left ventricular diastolic parameters are indeterminate. Right Ventricle: The right ventricular size is normal. No increase in right ventricular wall thickness. Right ventricular systolic function is normal. There is mildly elevated pulmonary artery systolic pressure. The tricuspid regurgitant velocity is 3.14  m/s, and with an assumed right atrial pressure of 5 mmHg, the estimated right ventricular systolic pressure is 45.6 mmHg. Left Atrium: Left atrial size was normal in size. Right Atrium: Right atrial size was normal in size. Pericardium: There is no evidence of pericardial effusion. Mitral Valve: The mitral valve is normal in structure. Mild mitral valve regurgitation. No evidence of mitral valve stenosis. MV peak gradient, 5.3 mmHg. The mean mitral valve gradient is 2.0 mmHg. Tricuspid Valve: The tricuspid valve is normal in structure. Tricuspid valve regurgitation is mild to moderate. No evidence of tricuspid stenosis. Aortic Valve: The aortic valve is normal in structure. Aortic valve regurgitation is not visualized. Aortic  valve sclerosis/calcification is present, without any evidence of aortic stenosis. Aortic valve mean gradient measures 8.5 mmHg. Aortic valve peak  gradient measures 15.6 mmHg. Aortic valve area, by VTI measures 1.66 cm. Pulmonic Valve: The pulmonic valve was normal in structure. Pulmonic valve regurgitation is not visualized. Mild pulmonic stenosis. Aorta: The aortic root is normal in size and structure. Venous: The inferior vena cava is normal in size with greater than 50% respiratory variability, suggesting right atrial pressure of 3 mmHg. IAS/Shunts: No atrial level shunt detected by color flow Doppler.  LEFT VENTRICLE PLAX 2D LVIDd:         4.33 cm   Diastology LVIDs:         2.29 cm   LV e' medial:    9.36 cm/s LV PW:         1.18 cm   LV E/e' medial:  11.0 LV IVS:        0.80 cm   LV e' lateral:   12.20 cm/s LVOT diam:     2.00 cm   LV E/e' lateral: 8.4 LV SV:         64 LV SV Index:   32 LVOT Area:     3.14 cm  RIGHT VENTRICLE RV Basal diam:  3.76 cm LEFT ATRIUM           Index  RIGHT ATRIUM           Index LA diam:      4.50 cm 2.26 cm/m   RA Area:     19.30 cm LA Vol (A2C): 45.5 ml 22.82 ml/m  RA Volume:   57.20 ml  28.68 ml/m LA Vol (A4C): 55.8 ml 27.98 ml/m  AORTIC VALVE                     PULMONIC VALVE AV Area (Vmax):    1.57 cm      PV Vmax:          0.87 m/s AV Area (Vmean):   1.52 cm      PV Vmean:         52.000 cm/s AV Area (VTI):     1.66 cm      PV VTI:           0.151 m AV Vmax:           197.50 cm/s   PV Peak grad:     3.0 mmHg AV Vmean:          138.000 cm/s  PV Mean grad:     1.0 mmHg AV VTI:            0.389 m       PR End Diast Vel: 4.75 msec AV Peak Grad:      15.6 mmHg AV Mean Grad:      8.5 mmHg LVOT Vmax:         98.40 cm/s LVOT Vmean:        66.900 cm/s LVOT VTI:          0.205 m LVOT/AV VTI ratio: 0.53  AORTA Ao Root diam: 3.50 cm MITRAL VALVE                TRICUSPID VALVE MV Area (PHT): 3.53 cm     TR Peak grad:   39.4 mmHg MV Area VTI:   2.52 cm     TR Vmax:         314.00 cm/s MV Peak grad:  5.3 mmHg MV Mean grad:  2.0 mmHg     SHUNTS MV Vmax:       1.15 m/s     Systemic VTI:  0.20 m MV Vmean:      63.3 cm/s    Systemic Diam: 2.00 cm MV Decel Time: 215 msec MV E velocity: 102.70 cm/s Ida Rogue MD Electronically signed by Ida Rogue MD Signature Date/Time: 06/21/2021/3:09:58 PM    Final    DG Chest Portable 1 View  Result Date: 06/21/2021 CLINICAL DATA:  Shortness of breath. EXAM: PORTABLE CHEST 1 VIEW COMPARISON:  Chest radiographs 01/29/2017 FINDINGS: The patient is rotated to the right. The cardiac silhouette is mildly enlarged. There are symmetric bilateral interstitial opacities which are greater than on the prior study, and there are small bilateral pleural effusions. No pneumothorax is identified. No acute osseous abnormality is seen. IMPRESSION: Cardiomegaly and bilateral interstitial opacities compatible with edema. Small pleural effusions. Electronically Signed   By: Logan Bores M.D.   On: 06/21/2021 09:53      Assessment/Plan Principal Problem:   Acute respiratory failure with hypoxia (HCC) Active Problems:   Acute on chronic diastolic CHF (congestive heart failure) (HCC)   Acute bronchitis   Sepsis ([place])   Essential (primary) hypertension   HLD (hyperlipidemia)   Adult hypothyroidism   Obstructive sleep apnea   Hypothermia   Elevated  troponin   Obesity with body mass index (BMI) of 30.0 to 39.9   CKD (chronic kidney disease), stage IV (HCC)   Type II diabetes mellitus with renal manifestations (HCC)   New onset atrial fibrillation (HCC)   Principal Problem:   Acute respiratory failure with hypoxia (HCC) Active Problems:   Acute on chronic diastolic CHF (congestive heart failure) (HCC)   Acute bronchitis   Sepsis ([place])   Essential (primary) hypertension   HLD (hyperlipidemia)   Adult hypothyroidism   Obstructive sleep apnea   Hypothermia   Elevated troponin   Obesity with body mass index (BMI) of 30.0 to 39.9   CKD  (chronic kidney disease), stage IV (HCC)   Type II diabetes mellitus with renal manifestations ([place])   New onset atrial fibrillation (HCC)   Assessment and Plan: * Acute respiratory failure with hypoxia ([place]) Patient has acute respiratory failure with hypoxia, with new oxygen requirement, requiring BiPAP.  Likely due to CHF exacerbation.  Patient has elevated BNP 562, 2+ leg edema, crackles on auscultation, chest x-ray showed interstitial edema, clinically consistent with CHF exacerbation.  Patient also has audible wheezing, leukocytosis with WBC 19.7, indicating possible acute bronchitis.  -Admitted to stepdown as inpatient -Continue BiPAP -Bronchodilators -Antibiotics as below -IV Lasix for CHF exacerbation  Acute on chronic diastolic CHF (congestive heart failure) ([place]) 2D echo on 01/28/2017 showed EF 70% with grade 1 diastolic dysfunction.  Now has CHF exacerbation -Lasix 80 mg bid by IV -2d echo -Daily weights -strict I/O's -Low salt diet -Fluid restriction -Obtain REDs Vest reading  Acute bronchitis Sepsis due to acute bronchitis: Patient has audible wheezing, indicating possible acute bronchitis.  Patient meets criteria for sepsis with WBC 19.7, hypothermia with temperature 94.3 and tachypnea with RR 26. Pending Covid19 PCR  - IV Rocephin and azithromycin -Solu-Medrol 40 mg twice daily - Mucinex for cough  - ronchodilators - Urine legionella and S. pneumococcal antigen - Follow up blood culture x2, sputum culture - will get Procalcitonin and trend lactic acid level - IVF: will not give IVF due to CHF exacerbatwion  Sepsis ([place]) -see above  Essential (primary) hypertension -IV hydralazine as needed -Hold coreg, amlodipine, Cozaar and oral hydralazine since patient is at risk of developing hypotension due to sepsis  HLD (hyperlipidemia) - Zocor  Adult hypothyroidism - Synthroid  Obstructive sleep apnea - On BiPAP now  Hypothermia Temperature 94.3 -Bair  Hugger  Elevated troponin Troponin 24, no chest pain, likely due to demand ischemia -Patient is allergic to aspirin -Continue Zocor -Trend troponin -Check A1c, FLP  Obesity with body mass index (BMI) of 30.0 to 39.9  BMI= 34.33   and BW= 90.7 -Diet and exercise.   -Encouraged to lose weight.   CKD (chronic kidney disease), stage IV (HCC) Stable.  Recent baseline creatinine 1.8-2.0.  His creatinine is 1.92, BUN 49.   -Follow-up with BMP  Type II diabetes mellitus with renal manifestations (HCC) Recent A1c 8.0, poorly controlled.  Patient is taking Tradjenta and Amaryl at home -sliding scale insulin   New onset atrial fibrillation (HCC) HR 50-60. CHA2DS2-VASc Score is 5. Consulted Dr. Rockey Situ of card, he recommended to start IV heparin, I did discharge could transition to NOAC  -start IV heparin -continue home coreg 12.5 mg bid -f/u TSH and 2d echo             DVT ppx:  On IV heparin  Code Status: DNR per her daughter who is the power of attorney  Family Communication:  Yes, patient's daughter and the wife at bed side.     Disposition Plan:  Anticipate discharge back to previous environment  Consults called: Dr. Rockey Situ of card  Admission status and  Level of care: Stepdown:       SDU/inpation         Severity of Illness:  The appropriate patient status for this patient is INPATIENT. Inpatient status is judged to be reasonable and necessary in order to provide the required intensity of service to ensure the patient's safety. The patient's presenting symptoms, physical exam findings, and initial radiographic and laboratory data in the context of their chronic comorbidities is felt to place them at high risk for further clinical deterioration. Furthermore, it is not anticipated that the patient will be medically stable for discharge from the hospital within 2 midnights of admission.   * I certify that at the point of admission it is my clinical judgment that  the patient will require inpatient hospital care spanning beyond 2 midnights from the point of admission due to high intensity of service, high risk for further deterioration and high frequency of surveillance required.*       Date of Service 06/21/2021    Ivor Costa [facility]   If 7PM-7AM, please contact night-coverage [email or web address] 06/21/2021, 6:35 PM

## 2021-06-21 NOTE — Consult Note (Signed)
Cardiology Consultation:   Patient ID: Randy Dalton MRN: [id number]; DOB: [birth year]  Admit date: 06/21/2021 Date of Consult: 06/21/2021  PCP:  Jerrol Banana., MD   [facility] Providers Cardiologist:  New to [facility] Physician requesting consult: Dr. Blaine Hamper Reason for consult: New onset atrial fibrillation  Patient Profile:   Randy Dalton is a 88 y.o. male with a hx of Hypertension, hyperlipidemia, diabetes, GERD, sleep apnea, chronic kidney disease stage IV, diastolic CHF presenting with worsening shortness of breath  History of Present Illness:   Much of the history obtained from wife and daughter at the bedside Randy Dalton reports he was in his usual state of health, had some waxing waning lower extremity edema, some noncompliance with his torsemide which he would take sparingly as he did not like to urinate, presenting with worsening shortness of breath starting 3 AM.  Wife found him uncovered in the bed, short of breath.  Given his labored breathing, and he felt cold, EMS called.  Noted to have oxygen saturations in the 60s on room air with severe respiratory distress, could not speak in full sentences.  He was initially placed on nasal cannula oxygen and then was transitioned to BiPAP in the emergency room.  By report is supposed to be on CPAP but has not been using it for the past 3 months or more, waiting for new machine  In the ER BNP 560, started on a warming blanket for hypothermia temperature 94 degrees  EKG confirming atrial fibrillation   Past Medical History:  Diagnosis Date   CHF (congestive heart failure) ([place])    Chronic kidney disease    Diabetes mellitus without complication ([place])    Diverticulosis    GERD (gastroesophageal reflux disease)    Hypercholesteremia    Hypertension    Hypothyroidism    Obstructive sleep apnea    CPAP   Wears dentures    partial top    Past Surgical History:  Procedure Laterality Date   BACK SURGERY     CATARACT  EXTRACTION W/PHACO Left 10/24/2014   Procedure: CATARACT EXTRACTION PHACO AND INTRAOCULAR LENS PLACEMENT ([place]);  Surgeon: Ronnell Freshwater, MD;  Location: [place];  Service: Ophthalmology;  Laterality: Left;  DIABETIC - oral meds   CATARACT EXTRACTION W/PHACO Right 06/17/2017   Procedure: CATARACT EXTRACTION PHACO AND INTRAOCULAR LENS PLACEMENT ([facility]);  Surgeon: Birder Robson, MD;  Location: [facility] ORS;  Service: Ophthalmology;  Laterality: Right;  [place] 01:43 AP% 19.7 CDE 20.48 Fluid pack lot # [id number]   COLONOSCOPY     HERNIA REPAIR     PILONIDAL CYST EXCISION     STOMACH SURGERY       Home Medications:  Prior to Admission medications   Medication Sig Start Date End Date Taking? Authorizing Provider  acetaminophen (TYLENOL) 650 MG CR tablet Take 650 mg by mouth every 8 (eight) hours as needed for mild pain.   Yes [provider]  amLODipine (NORVASC) 10 MG tablet TAKE 1 TABLET(10 MG) BY MOUTH DAILY 05/14/21  Yes Jerrol Banana., MD  carvedilol (COREG) 12.5 MG tablet TAKE 1 TABLET BY MOUTH TWICE DAILY 07/04/17  Yes Jerrol Banana., MD  Cholecalciferol (VITAMIN D) 2000 UNITS tablet Take 2,000 Units by mouth daily.  11/08/10  Yes [provider]  glimepiride (AMARYL) 4 MG tablet TAKE 1 TABLET(4 MG) BY MOUTH DAILY 05/29/21  Yes Jerrol Banana., MD  hydrALAZINE (APRESOLINE) 100 MG tablet Take 1 tablet (100 mg  total) by mouth 2 (two) times daily. 03/01/20  Yes Jerrol Banana., MD  levothyroxine (SYNTHROID) 175 MCG tablet TAKE 1 TABLET BY MOUTH EVERY DAY 09/15/20  Yes Jerrol Banana., MD  losartan (COZAAR) 100 MG tablet Take 1 tablet (100 mg total) by mouth daily. 10/28/20  Yes Jerrol Banana., MD  Multiple Vitamin (MULTIVITAMIN) tablet Take 1 tablet by mouth daily.   Yes [provider]  simvastatin (ZOCOR) 20 MG tablet TAKE 1 TABLET(20 MG) BY MOUTH AT BEDTIME 03/13/21  Yes Jerrol Banana., MD  torsemide  (DEMADEX) 20 MG tablet Take 20 mg by mouth daily. 02/21/17  Yes [provider]  TRADJENTA 5 MG TABS tablet TAKE 1 TABLET(5 MG) BY MOUTH DAILY 04/12/21  Yes Jerrol Banana., MD  meloxicam (MOBIC) 7.5 MG tablet Take 1 tablet (7.5 mg total) by mouth 2 (two) times daily. Patient not taking: Reported on 06/21/2021 01/27/18   Jerrol Banana., MD  potassium chloride SA (K-DUR,KLOR-CON) 20 MEQ tablet Take 1 tablet by mouth every other day.  Patient not taking: Reported on 06/21/2021 02/03/17   [provider]  sildenafil (VIAGRA) 100 MG tablet Take 100 mg by mouth as needed.  Patient not taking: Reported on 06/21/2021 10/09/13   [provider]    Inpatient Medications: Scheduled Meds:  cholecalciferol  2,000 Units Oral Daily   enoxaparin (LOVENOX) injection  40 mg Subcutaneous Q24H   furosemide  80 mg Intravenous Q12H   insulin aspart  0-5 Units Subcutaneous QHS   insulin aspart  0-9 Units Subcutaneous TID WC   ipratropium-albuterol  3 mL Nebulization Q4H   levothyroxine  175 mcg Oral Q0600   [START ON 06/22/2021] methylPREDNISolone (SOLU-MEDROL) injection  80 mg Intravenous Daily   multivitamin with minerals  1 tablet Oral Daily   mouth rinse  15 mL Mouth Rinse 4 times per day   simvastatin  20 mg Oral q1800   Continuous Infusions:  [START ON 06/22/2021] azithromycin (ZITHROMAX) 500 mg in sodium chloride 0.9 % 250 mL IVPB     [START ON 06/22/2021] cefTRIAXone (ROCEPHIN)  IV     PRN Meds: acetaminophen, albuterol, dextromethorphan-guaiFENesin, hydrALAZINE, ondansetron (ZOFRAN) IV, mouth rinse  Allergies:    Allergies  Allergen Reactions   Aspirin Other (See Comments)    Feel bad Feel bad   Codeine Nausea Only    Social History:   Social History   Socioeconomic History   Marital status: Married    Spouse name: Not on file   Number of children: 1   Years of education: Not on file   Highest education level: High school graduate  Occupational  History   Occupation: retired  Tobacco Use   Smoking status: Never   Smokeless tobacco: Never  Vaping Use   Vaping Use: Never used  Substance and Sexual Activity   Alcohol use: No    Alcohol/week: 0.0 standard drinks of alcohol   Drug use: No   Sexual activity: Not Currently  Other Topics Concern   Not on file  Social History Narrative   Not on file   Social Determinants of Health   Financial Resource Strain: Low Risk  (09/09/2020)   Overall Financial Resource Strain (CARDIA)    Difficulty of Paying Living Expenses: Not hard at all  Food Insecurity: No Food Insecurity (09/09/2020)   Hunger Vital Sign    Worried About Running Out of Food in the Last Year: Never true    Ran  Out of Food in the Last Year: Never true  Transportation Needs: No Transportation Needs (09/09/2020)   PRAPARE - Hydrologist (Medical): No    Lack of Transportation (Non-Medical): No  Physical Activity: Inactive (09/09/2020)   Exercise Vital Sign    Days of Exercise per Week: 0 days    Minutes of Exercise per Session: 0 min  Stress: No Stress Concern Present (09/09/2020)   [place]    Feeling of Stress : Not at all  Social Connections: [place] (06/23/2019)   Social Connection and Isolation Panel [NHANES]    Frequency of Communication with Friends and Family: More than three times a week    Frequency of Social Gatherings with Friends and Family: More than three times a week    Attends Religious Services: More than 4 times per year    Active Member of [employer] or Organizations: Yes    Attends Music therapist: More than 4 times per year    Marital Status: Married  Human resources officer Violence: Not At Risk (06/23/2019)   Humiliation, Afraid, Rape, and Kick questionnaire    Fear of Current or Ex-Partner: No    Emotionally Abused: No    Physically Abused: No    Sexually Abused: No    Family History:     Family History  Problem Relation Age of Onset   Hypertension Mother    Kidney failure Mother    Diabetes Father    Lung cancer Brother    Colon cancer Brother    Cancer Brother        skin, removed   Dementia Sister    Dementia Sister    Hypertension Sister      ROS:  Please see the history of present illness.  Review of Systems  Constitutional:  Positive for malaise/fatigue.  HENT: Negative.    Respiratory:  Positive for shortness of breath.   Cardiovascular: Negative.   Gastrointestinal: Negative.   Musculoskeletal: Negative.   Neurological: Negative.   Psychiatric/Behavioral: Negative.    All other systems reviewed and are negative.   Physical Exam/Data:   Vitals:   06/21/21 1400 06/21/21 1500 06/21/21 1528 06/21/21 1553  BP: 113/61 (!) 148/69    Pulse: 63 72 79 84  Resp: 18 (!) 32 (!) 21 19  Temp:  97.9 F (36.6 C)    TempSrc:      SpO2: 91% 96% 97% 94%  Weight:      Height:        Intake/Output Summary (Last 24 hours) at 06/21/2021 1745 Last data filed at 06/21/2021 1435 Gross per 24 hour  Intake 356.71 ml  Output 0 ml  Net 356.71 ml      06/21/2021   11:45 AM 06/21/2021    9:23 AM 02/21/2021    1:45 PM  Last 3 Weights  Weight (lbs) 209 lb 3.5 oz 200 lb 208 lb  Weight (kg) 94.9 kg 90.719 kg 94.348 kg     Body mass index is 35.91 kg/m.  General:  Well nourished, well developed, in no acute distress HEENT: normal Neck: no JVD Vascular: No carotid bruits; Distal pulses 2+ bilaterally Cardiac:  normal S1, S2; RRR; no murmur  Lungs:  clear to auscultation bilaterally, no wheezing, rhonchi or rales  Abd: soft, nontender, no hepatomegaly  Ext: no edema Musculoskeletal:  No deformities, BUE and BLE strength normal and equal Skin: warm and dry  Neuro:  CNs 2-12  intact, no focal abnormalities noted Psych:  Normal affect   EKG:  The EKG was personally reviewed and demonstrates:   Atrial fibrillation rate 64 bpm PVCs Telemetry:  Telemetry was  personally reviewed and demonstrates:   Atrial fibrillation  Relevant CV Studies: Echocardiogram  1. Left ventricular ejection fraction, by estimation, is 60 to 65%. The  left ventricle has normal function. The left ventricle has no regional  wall motion abnormalities. Left ventricular diastolic parameters are  indeterminate.   2. Right ventricular systolic function is normal. The right ventricular  size is normal. There is mildly elevated pulmonary artery systolic  pressure. The estimated right ventricular systolic pressure is 94.5 mmHg.   3. The mitral valve is normal in structure. Mild mitral valve  regurgitation. No evidence of mitral stenosis.   4. Tricuspid valve regurgitation is mild to moderate.   5. The aortic valve is normal in structure. Aortic valve regurgitation is  not visualized. Aortic valve sclerosis/calcification is present, without  any evidence of aortic stenosis.   6. Mild pulmonic stenosis.   7. The inferior vena cava is normal in size with greater than 50%  respiratory variability, suggesting right atrial pressure of 3 mmHg.   Laboratory Data:  High Sensitivity Troponin:   Recent Labs  Lab 06/21/21 0920 06/21/21 1149  TROPONINIHS 24* 24*     Chemistry Recent Labs  Lab 06/21/21 0920  NA 138  K 4.8  CL 109  CO2 22  GLUCOSE 274*  BUN 49*  CREATININE 1.92*  CALCIUM 9.6  GFRNONAA 32*  ANIONGAP 7    Recent Labs  Lab 06/21/21 0920  PROT 7.2  ALBUMIN 4.0  AST 15  ALT 16  ALKPHOS 80  BILITOT 1.7*   Lipids No results for input(s): "CHOL", "TRIG", "HDL", "LABVLDL", "LDLCALC", "CHOLHDL" in the last 168 hours.  Hematology Recent Labs  Lab 06/21/21 0920  WBC 19.7*  RBC 3.98*  HGB 12.5*  HCT 38.1*  MCV 95.7  MCH 31.4  MCHC 32.8  RDW 14.0  PLT 179   Thyroid No results for input(s): "TSH", "FREET4" in the last 168 hours.  BNP Recent Labs  Lab 06/21/21 0920  BNP 562.0*    DDimer No results for input(s): "DDIMER" in the last 168  hours.   Radiology/Studies:  ECHOCARDIOGRAM COMPLETE  Result Date: 06/21/2021    ECHOCARDIOGRAM REPORT   Patient Name:   Randy Dalton Date of Exam: 06/21/2021 Medical Rec #:  [id number]     Height:       64.0 in Accession #:    [id number]    Weight:       209.2 lb Date of Birth:  23-May-1927     BSA:          1.994 m Patient Age:    57 years      BP:           110/53 mmHg Patient Gender: M             HR:           63 bpm. Exam Location:  [facility] Procedure: 2D Echo, Color Doppler and Cardiac Doppler Indications:     I50.31 congestive heart failure-Acute Diastolic  History:         Patient has prior history of Echocardiogram examinations. CHF,                  CKD; Risk Factors:Hypertension, Diabetes, HCL and Sleep Apnea.  Sonographer:     Charmayne Sheer  Referring Phys:  [id number] Soledad Gerlach NIU Diagnosing Phys: Ida Rogue MD  Sonographer Comments: Suboptimal apical window. IMPRESSIONS  1. Left ventricular ejection fraction, by estimation, is 60 to 65%. The left ventricle has normal function. The left ventricle has no regional wall motion abnormalities. Left ventricular diastolic parameters are indeterminate.  2. Right ventricular systolic function is normal. The right ventricular size is normal. There is mildly elevated pulmonary artery systolic pressure. The estimated right ventricular systolic pressure is 10.2 mmHg.  3. The mitral valve is normal in structure. Mild mitral valve regurgitation. No evidence of mitral stenosis.  4. Tricuspid valve regurgitation is mild to moderate.  5. The aortic valve is normal in structure. Aortic valve regurgitation is not visualized. Aortic valve sclerosis/calcification is present, without any evidence of aortic stenosis.  6. Mild pulmonic stenosis.  7. The inferior vena cava is normal in size with greater than 50% respiratory variability, suggesting right atrial pressure of 3 mmHg. FINDINGS  Left Ventricle: Left ventricular ejection fraction, by estimation, is 60 to 65%. The left  ventricle has normal function. The left ventricle has no regional wall motion abnormalities. The left ventricular internal cavity size was normal in size. There is  no left ventricular hypertrophy. Left ventricular diastolic parameters are indeterminate. Right Ventricle: The right ventricular size is normal. No increase in right ventricular wall thickness. Right ventricular systolic function is normal. There is mildly elevated pulmonary artery systolic pressure. The tricuspid regurgitant velocity is 3.14  m/s, and with an assumed right atrial pressure of 5 mmHg, the estimated right ventricular systolic pressure is 58.5 mmHg. Left Atrium: Left atrial size was normal in size. Right Atrium: Right atrial size was normal in size. Pericardium: There is no evidence of pericardial effusion. Mitral Valve: The mitral valve is normal in structure. Mild mitral valve regurgitation. No evidence of mitral valve stenosis. MV peak gradient, 5.3 mmHg. The mean mitral valve gradient is 2.0 mmHg. Tricuspid Valve: The tricuspid valve is normal in structure. Tricuspid valve regurgitation is mild to moderate. No evidence of tricuspid stenosis. Aortic Valve: The aortic valve is normal in structure. Aortic valve regurgitation is not visualized. Aortic valve sclerosis/calcification is present, without any evidence of aortic stenosis. Aortic valve mean gradient measures 8.5 mmHg. Aortic valve peak  gradient measures 15.6 mmHg. Aortic valve area, by VTI measures 1.66 cm. Pulmonic Valve: The pulmonic valve was normal in structure. Pulmonic valve regurgitation is not visualized. Mild pulmonic stenosis. Aorta: The aortic root is normal in size and structure. Venous: The inferior vena cava is normal in size with greater than 50% respiratory variability, suggesting right atrial pressure of 3 mmHg. IAS/Shunts: No atrial level shunt detected by color flow Doppler.  LEFT VENTRICLE PLAX 2D LVIDd:         4.33 cm   Diastology LVIDs:         2.29 cm    LV e' medial:    9.36 cm/s LV PW:         1.18 cm   LV E/e' medial:  11.0 LV IVS:        0.80 cm   LV e' lateral:   12.20 cm/s LVOT diam:     2.00 cm   LV E/e' lateral: 8.4 LV SV:         64 LV SV Index:   32 LVOT Area:     3.14 cm  RIGHT VENTRICLE RV Basal diam:  3.76 cm LEFT ATRIUM  Index        RIGHT ATRIUM           Index LA diam:      4.50 cm 2.26 cm/m   RA Area:     19.30 cm LA Vol (A2C): 45.5 ml 22.82 ml/m  RA Volume:   57.20 ml  28.68 ml/m LA Vol (A4C): 55.8 ml 27.98 ml/m  AORTIC VALVE                     PULMONIC VALVE AV Area (Vmax):    1.57 cm      PV Vmax:          0.87 m/s AV Area (Vmean):   1.52 cm      PV Vmean:         52.000 cm/s AV Area (VTI):     1.66 cm      PV VTI:           0.151 m AV Vmax:           197.50 cm/s   PV Peak grad:     3.0 mmHg AV Vmean:          138.000 cm/s  PV Mean grad:     1.0 mmHg AV VTI:            0.389 m       PR End Diast Vel: 4.75 msec AV Peak Grad:      15.6 mmHg AV Mean Grad:      8.5 mmHg LVOT Vmax:         98.40 cm/s LVOT Vmean:        66.900 cm/s LVOT VTI:          0.205 m LVOT/AV VTI ratio: 0.53  AORTA Ao Root diam: 3.50 cm MITRAL VALVE                TRICUSPID VALVE MV Area (PHT): 3.53 cm     TR Peak grad:   39.4 mmHg MV Area VTI:   2.52 cm     TR Vmax:        314.00 cm/s MV Peak grad:  5.3 mmHg MV Mean grad:  2.0 mmHg     SHUNTS MV Vmax:       1.15 m/s     Systemic VTI:  0.20 m MV Vmean:      63.3 cm/s    Systemic Diam: 2.00 cm MV Decel Time: 215 msec MV E velocity: 102.70 cm/s Ida Rogue MD Electronically signed by Ida Rogue MD Signature Date/Time: 06/21/2021/3:09:58 PM    Final    DG Chest Portable 1 View  Result Date: 06/21/2021 CLINICAL DATA:  Shortness of breath. EXAM: PORTABLE CHEST 1 VIEW COMPARISON:  Chest radiographs 01/29/2017 FINDINGS: The patient is rotated to the right. The cardiac silhouette is mildly enlarged. There are symmetric bilateral interstitial opacities which are greater than on the prior study, and there  are small bilateral pleural effusions. No pneumothorax is identified. No acute osseous abnormality is seen. IMPRESSION: Cardiomegaly and bilateral interstitial opacities compatible with edema. Small pleural effusions. Electronically Signed   By: Logan Bores M.D.   On: 06/21/2021 09:53     Assessment and Plan:   Atrial fibrillation, Timing of onset unclear Reports he is asymptomatic, rate relatively well controlled Recommend we continue Coreg 12.5 twice daily which he is on at home Given elevated CHADS VASC, will initiate heparin At discharge could transition to NOAC  No immediate plans  for TEE cardioversion as he is relatively asymptomatic and unable to exclude long-term persistent atrial fibrillation  Acute respiratory failure with hypoxia Acute bronchitis/sepsis Had wheezing on arrival, leukocytosis, unable to exclude URI On broad-spectrum antibiotics, steroids -Likely component of CHF in the setting of noncompliance with his torsemide, atrial fibrillation Clinical exam with lower extremity edema -Would continue IV Lasix twice daily, close monitoring of renal function given chronic kidney disease stage IV  Acute on chronic diastolic CHF On discussion with family, some dietary and medication noncompliance -Continue IV Lasix as above  Elevated troponin Likely demand ischemia in the setting of hypoxia No anginal symptoms, medical management at this time  Chronic kidney disease stage IV Creatinine 1.9, around his baseline We will need to monitor closely with IV Lasix   Total encounter time more than 80 minutes  Greater than 50% was spent in counseling and coordination of care with the patient   For questions or updates, please contact [facility] Please consult [email or web address] for contact info under    Signed, Ida Rogue, MD  06/21/2021 5:45 PM

## 2021-06-21 NOTE — Assessment & Plan Note (Signed)
Patient has acute

## 2021-06-21 NOTE — Assessment & Plan Note (Addendum)
Troponin 24, no chest pain, likely due to demand ischemia -Patient is allergic to aspirin -Continue Zocor -Trend troponin -Check A1c, FLP

## 2021-06-21 NOTE — Telephone Encounter (Signed)
Copied from [place] [id number]. Topic: Appointment Scheduling - Scheduling Inquiry for Clinic >> Jun 21, 2021  9:40 AM Cyndi Bender wrote: Reason for CRM: Pt spouse reports that pt was admitted in to the hospital so he will not be able to come in for his appt (Medicare AWV).

## 2021-06-21 NOTE — Progress Notes (Signed)
ANTICOAGULATION CONSULT NOTE - Initial Consult  Pharmacy Consult for Heparin Indication: atrial fibrillation  Allergies  Allergen Reactions   Aspirin Other (See Comments)    Feel bad Feel bad   Codeine Nausea Only    Patient Measurements: Height: 5\' 4"  (162.6 cm) Weight: 94.9 kg (209 lb 3.5 oz) IBW/kg (Calculated) : 59.2 Heparin Dosing Weight: 80 kg  Labs: Recent Labs    06/21/21 0920 06/21/21 1149  HGB 12.5*  --   HCT 38.1*  --   PLT 179  --   LABPROT 15.2  --   INR 1.2  --   CREATININE 1.92*  --   TROPONINIHS 24* 24*    Estimated Creatinine Clearance: 25.5 mL/min (A) (by C-G formula based on SCr of 1.92 mg/dL (H)).  Assessment: 88 year old male admitted with new onset Afib, pharmacy consulted to assist with systemic heparin anticoagulation. No prior anticoagulation on record - lovenox dc'd prior to initial administration.  Goal of Therapy:  Heparin level 0.3-0.7 units/ml Monitor platelets by anticoagulation protocol: Yes   Plan:  Give 4000 units bolus x 1 Start heparin infusion at 950 units/hr Check anti-Xa level in 8 hours and daily while on heparin Continue to monitor H&H and platelets  Lorenso Courier, PharmD Clinical Pharmacist 06/21/2021 6:59 PM

## 2021-06-21 NOTE — Progress Notes (Signed)
RT assisted with patient transport to ICU from ER while patient on the [place]. No complications noted during transport.

## 2021-06-21 NOTE — Assessment & Plan Note (Addendum)
Sepsis due to acute bronchitis: Patient has audible wheezing, indicating possible acute bronchitis.  Patient meets criteria for sepsis with WBC 19.7, hypothermia with temperature 94.3 and tachypnea with RR 26. Pending Covid19 PCR  - IV Rocephin and azithromycin -Solu-Medrol 40 mg twice daily - Mucinex for cough  - ronchodilators - Urine legionella and S. pneumococcal antigen - Follow up blood culture x2, sputum culture - will get Procalcitonin and trend lactic acid level - IVF: will not give IVF due to CHF exacerbatwion

## 2021-06-21 NOTE — Assessment & Plan Note (Signed)
-  see above 

## 2021-06-21 NOTE — Assessment & Plan Note (Signed)
Patient has acute respiratory failure with hypoxia, with new oxygen requirement, requiring BiPAP.  Likely due to CHF exacerbation.  Patient has elevated BNP 562, 2+ leg edema, crackles on auscultation, chest x-ray showed interstitial edema, clinically consistent with CHF exacerbation.  Patient also has audible wheezing, leukocytosis with WBC 19.7, indicating possible acute bronchitis.  -Admitted to stepdown as inpatient -Continue BiPAP -Bronchodilators -Antibiotics as below -IV Lasix for CHF exacerbation

## 2021-06-21 NOTE — ED Provider Notes (Signed)
[facility] Provider Note    Event Date/Time   First MD Initiated Contact with Patient 06/21/21 [phone]     (approximate)   History   Chief Complaint Shortness of Breath   HPI  Randy Dalton is a 88 y.o. male with past medical history of hypertension, hyperlipidemia, diabetes, diastolic CHF, and CKD who presents to the ED complaining of shortness of breath.  Patient reports that he has had acute onset of difficulty breathing over the past 2 to 3 hours, denies any associated fevers, cough, or chest pain.  His wife states that he typically sleeps in the bedroom while she sleeps in a recliner, but when she went to check on him this morning he had stated he was feeling sick with significant difficulty breathing.  She states that he was doing fine last night, does have some chronic shortness of breath that was no worse than usual yesterday.  Patient has not noticed any pain or swelling in his legs, denies any pain in his abdomen, vomiting, or diarrhea.  Per EMS, patient found to have initial oxygen saturation in the 70s with the fire department, was placed on a nonrebreather and transition to 4 L nasal cannula with EMS, at which point oxygen saturations were noted to be in the mid 90s.     Physical Exam   Triage Vital Signs: ED Triage Vitals  Enc Vitals Group     BP      Pulse      Resp      Temp      Temp src      SpO2      Weight      Height      Head Circumference      Peak Flow      Pain Score      Pain Loc      Pain Edu?      Excl. in [place]?     Most recent vital signs: Vitals:   06/21/21 0942 06/21/21 0945  BP:    Pulse:  (!) 59  Resp:  (!) 23  Temp: (!) 94.3 F (34.6 C)   SpO2:  100%    Constitutional: Alert and oriented. Eyes: Conjunctivae are normal. Head: Atraumatic. Nose: No congestion/rhinnorhea. Mouth/Throat: Mucous membranes are moist.  Cardiovascular: Normal rate, regular rhythm. Grossly normal heart sounds.  2+ radial pulses  bilaterally. Respiratory: Tachypneic with moderate respiratory distress and abdominal accessory muscle use.  Wheezes and crackles throughout. Gastrointestinal: Soft and nontender. No distention. Musculoskeletal: No lower extremity tenderness, 1+ pitting edema to knees bilaterally. Neurologic:  Normal speech and language. No gross focal neurologic deficits are appreciated.    ED Results / Procedures / Treatments   Labs (all labs ordered are listed, but only abnormal results are displayed) Labs Reviewed  CBC WITH DIFFERENTIAL/PLATELET - Abnormal; Notable for the following components:      Result Value   WBC 19.7 (*)    RBC 3.98 (*)    Hemoglobin 12.5 (*)    HCT 38.1 (*)    Neutro Abs 17.4 (*)    Abs Immature Granulocytes 0.12 (*)    All other components within normal limits  COMPREHENSIVE METABOLIC PANEL - Abnormal; Notable for the following components:   Glucose, Bld 274 (*)    BUN 49 (*)    Creatinine, Ser 1.92 (*)    Total Bilirubin 1.7 (*)    GFR, Estimated 32 (*)    All other components within normal  limits  BRAIN NATRIURETIC PEPTIDE - Abnormal; Notable for the following components:   B Natriuretic Peptide 562.0 (*)    All other components within normal limits  BLOOD GAS, VENOUS - Abnormal; Notable for the following components:   pH, Ven 7.23 (*)    pO2, Ven 60 (*)    Acid-base deficit 6.4 (*)    All other components within normal limits  TROPONIN I (HIGH SENSITIVITY) - Abnormal; Notable for the following components:   Troponin I (High Sensitivity) 24 (*)    All other components within normal limits  SARS CORONAVIRUS 2 BY RT PCR  CULTURE, BLOOD (ROUTINE X 2)  CULTURE, BLOOD (ROUTINE X 2)  LACTIC ACID, PLASMA  LACTIC ACID, PLASMA  PROCALCITONIN     EKG  ED ECG REPORT I, Blake Divine, the attending physician, personally viewed and interpreted this ECG.   Date: 06/21/2021  EKG Time: 9:18  Rate: 64  Rhythm: atrial fibrillation, frequent PVCs  Axis: LAD   Intervals:none  ST&T Change: None  RADIOLOGY Chest x-ray reviewed and interpreted by me with significant bilateral infiltrates most consistent with pulmonary edema.  PROCEDURES:  Critical Care performed: Yes, see critical care procedure note(s)  .Critical Care  Performed by: Blake Divine, MD Authorized by: Blake Divine, MD   Critical care provider statement:    Critical care time (minutes):  30   Critical care time was exclusive of:  Separately billable procedures and treating other patients and teaching time   Critical care was necessary to treat or prevent imminent or life-threatening deterioration of the following conditions:  Respiratory failure   Critical care was time spent personally by me on the following activities:  Development of treatment plan with patient or surrogate, discussions with consultants, evaluation of patient's response to treatment, examination of patient, ordering and review of laboratory studies, ordering and review of radiographic studies, ordering and performing treatments and interventions, pulse oximetry, re-evaluation of patient's condition and review of old charts   I assumed direction of critical care for this patient from another provider in my specialty: no     Care discussed with: admitting provider      MEDICATIONS ORDERED IN ED: Medications  cefTRIAXone (ROCEPHIN) 1 g in sodium chloride 0.9 % 100 mL IVPB (1 g Intravenous New Bag/Given 06/21/21 1018)  azithromycin (ZITHROMAX) 500 mg in sodium chloride 0.9 % 250 mL IVPB (500 mg Intravenous New Bag/Given 06/21/21 1032)  ipratropium-albuterol (DUONEB) 0.5-2.5 (3) MG/3ML nebulizer solution 2 mL (2 mLs Nebulization Given 06/21/21 0929)  methylPREDNISolone sodium succinate (SOLU-MEDROL) 125 mg/2 mL injection 125 mg (125 mg Intravenous Given 06/21/21 0939)  furosemide (LASIX) injection 80 mg (80 mg Intravenous Given 06/21/21 1023)     IMPRESSION / MDM / [place] / ED COURSE  I reviewed the  triage vital signs and the nursing notes.                              88 y.o. male with past medical history of hypertension, hyperlipidemia, diabetes, diastolic CHF, and CKD who presents to the ED with acute onset difficulty breathing occurring over the past 2 to 3 hours.  Patient's presentation is most consistent with acute presentation with potential threat to life or bodily function.  Differential diagnosis includes, but is not limited to, ACS, PE, pneumonia, CHF exacerbation, COPD, asthma, pleural effusion, hypercapnia.  Patient ill-appearing and in moderate respiratory distress on arrival, although awake and alert and answering questions  appropriately.  He was able to clarify that he would not want chest compressions in the event of cardiac arrest, but states "do what ever you think is best" regarding intubation.  He was transitioned to BiPAP, which seems to be helping his work of breathing.  EKG shows atrial fibrillation with frequent PVCs, no ischemic changes noted.  He does not have any chest pain and I have low suspicion for PE at this time.  He does have significant wheezing and crackles concerning for bronchospasm versus CHF exacerbation.  We will treat with DuoNebs, steroids, and further assess with chest x-ray and labs.  Chest x-ray concerning for pulmonary edema and we will treat with IV Lasix.  Additionally, patient has a significant leukocytosis and there may be component of pneumonia or aspiration developing overnight, will cover with antibiotics for now.  VBG is reassuring without significant hypercapnia and clinically patient appears better with improved work of breathing on reassessment.  Additional labs are reassuring with stable renal function compared to previous, troponin mildly elevated but this is likely secondary to his CKD.  I had further discussion with patient's daughter at the bedside, who is his medical POA.  She states that the patient would not want chest compressions  or intubation in the case of worsening respiratory failure or cardiac arrest.  Order placed for DNR and case discussed with hospitalist for admission.      FINAL CLINICAL IMPRESSION(S) / ED DIAGNOSES   Final diagnoses:  Acute respiratory failure with hypoxia (HCC)  Acute on chronic diastolic congestive heart failure (HCC)  Bronchospasm     Rx / DC Orders   ED Discharge Orders     None        Note:  This document was prepared using Dragon voice recognition software and may include unintentional dictation errors.   Blake Divine, MD 06/21/21 [phone]

## 2021-06-21 NOTE — Assessment & Plan Note (Addendum)
Recent A1c 8.0, poorly controlled.  Patient is taking Tradjenta and Amaryl at home -sliding scale insulin

## 2021-06-21 NOTE — Assessment & Plan Note (Addendum)
-   Zocor 

## 2021-06-21 NOTE — Progress Notes (Deleted)
Annual Wellness Visit     Patient: Randy Dalton, Male    DOB: [birth year], 88 y.o.   MRN: [id number] Visit Date: 06/21/2021  Today's Provider: [name], MD   No chief complaint on file.  Subjective    Randy Dalton is a 88 y.o. male who presents today for his Annual Wellness Visit. He reports consuming a {diet types:17450} diet. {Exercise:19826} He generally feels {well/fairly well/poorly:18703}. He reports sleeping {well/fairly well/poorly:18703}. He {does/does not:200015} have additional problems to discuss today.   HPI    Medications: Outpatient Medications Prior to Visit  Medication Sig   amLODipine (NORVASC) 10 MG tablet TAKE 1 TABLET(10 MG) BY MOUTH DAILY   carvedilol (COREG) 12.5 MG tablet TAKE 1 TABLET BY MOUTH TWICE DAILY   Cholecalciferol (VITAMIN D) 2000 UNITS tablet Take 2,000 Units by mouth daily.    glimepiride (AMARYL) 4 MG tablet TAKE 1 TABLET(4 MG) BY MOUTH DAILY   hydrALAZINE (APRESOLINE) 100 MG tablet Take 1 tablet (100 mg total) by mouth 2 (two) times daily.   levothyroxine (SYNTHROID) 175 MCG tablet TAKE 1 TABLET BY MOUTH EVERY DAY   losartan (COZAAR) 100 MG tablet Take 1 tablet (100 mg total) by mouth daily.   meloxicam (MOBIC) 7.5 MG tablet Take 1 tablet (7.5 mg total) by mouth 2 (two) times daily.   Multiple Vitamin (MULTIVITAMIN) tablet Take 1 tablet by mouth daily.   potassium chloride SA (K-DUR,KLOR-CON) 20 MEQ tablet Take 1 tablet by mouth every other day.    sildenafil (VIAGRA) 100 MG tablet Take 100 mg by mouth as needed.    simvastatin (ZOCOR) 20 MG tablet TAKE 1 TABLET(20 MG) BY MOUTH AT BEDTIME   torsemide (DEMADEX) 20 MG tablet Take 20 mg by mouth daily.   TRADJENTA 5 MG TABS tablet TAKE 1 TABLET(5 MG) BY MOUTH DAILY   No facility-administered medications prior to visit.    Allergies  Allergen Reactions   Aspirin Other (See Comments)    Feel bad Feel bad   Codeine Nausea Only    Patient Care Team: Jerrol Banana., MD as PCP - General (Family Medicine) Lavonia Dana, MD as Consulting Physician (Internal Medicine) Dionisio David, MD as Consulting Physician (Cardiology) Birder Robson, MD as Referring Physician (Ophthalmology)  Review of Systems  Constitutional: Negative.   HENT: Negative.    Eyes: Negative.   Respiratory: Negative.    Cardiovascular: Negative.   Gastrointestinal: Negative.   Endocrine: Negative.   Genitourinary: Negative.   Musculoskeletal: Negative.   Skin: Negative.   Allergic/Immunologic: Negative.   Neurological: Negative.   Hematological: Negative.   Psychiatric/Behavioral: Negative.      {Labs  Heme  Chem  Endocrine  Serology  Results Review (optional):23779}    Objective    Vitals: There were no vitals taken for this visit. {Show previous vital signs (optional):23777}   Physical Exam Constitutional:      Appearance: Normal appearance. He is normal weight.  HENT:     Head: Normocephalic and atraumatic.     Right Ear: Tympanic membrane, ear canal and external ear normal.     Left Ear: Tympanic membrane, ear canal and external ear normal.     Nose: Nose normal.     Mouth/Throat:     Mouth: Mucous membranes are moist.     Pharynx: Oropharynx is clear.  Eyes:     Extraocular Movements: Extraocular movements intact.     Conjunctiva/sclera: Conjunctivae normal.     Pupils: Pupils  are equal, round, and reactive to light.  Cardiovascular:     Rate and Rhythm: Normal rate and regular rhythm.     Pulses: Normal pulses.     Heart sounds: Normal heart sounds.  Pulmonary:     Effort: Pulmonary effort is normal.     Breath sounds: Normal breath sounds.  Abdominal:     General: Abdomen is flat. Bowel sounds are normal.     Palpations: Abdomen is soft.  Musculoskeletal:        General: Normal range of motion.     Cervical back: Normal range of motion and neck supple.  Skin:    General: Skin is warm and dry.  Neurological:     General: No focal  deficit present.     Mental Status: He is alert and oriented to person, place, and time. Mental status is at baseline.  Psychiatric:        Mood and Affect: Mood normal.        Behavior: Behavior normal.        Thought Content: Thought content normal.        Judgment: Judgment normal.    ***  Most recent functional status assessment:    09/09/2020    9:43 AM  In your present state of health, do you have any difficulty performing the following activities:  Hearing? 1  Comment DIFFICULTY HEARING  Vision? 1  Comment GLASSES  Difficulty concentrating or making decisions? 0  Walking or climbing stairs? 0  Dressing or bathing? 0  Doing errands, shopping? 0  Preparing Food and eating ? Y  Using the Toilet? Y  In the past six months, have you accidently leaked urine? N  Do you have problems with loss of bowel control? N  Managing your Medications? Y  Managing your Finances? Y  Housekeeping or managing your Housekeeping? Y   Most recent fall risk assessment:    09/09/2020    9:37 AM  [place] in the past year? 0  Follow up Follow up appointment    Most recent depression screenings:    09/09/2020    9:42 AM 06/23/2019    3:20 PM  PHQ 2/9 Scores  PHQ - 2 Score 0 0   Most recent cognitive screening:    09/09/2020    9:35 AM  6CIT Screen  What Year? 0 points  What month? 0 points  What time? 0 points  Count back from 20 0 points  Months in reverse 4 points  Repeat phrase 2 points  Total Score 6 points   Most recent Audit-C alcohol use screening    09/09/2020   10:11 AM  Alcohol Use Disorder Test (AUDIT)  1. How often do you have a drink containing alcohol? 0  2. How many drinks containing alcohol do you have on a typical day when you are drinking? 0  3. How often do you have six or more drinks on one occasion? 0  AUDIT-C Score 0   A score of 3 or more in women, and 4 or more in men indicates increased risk for alcohol abuse, EXCEPT if all of the points are from  question 1   No results found for any visits on 06/21/21.  Assessment & Plan     Annual wellness visit done today including the all of the following: Reviewed patient's Family Medical History Reviewed and updated list of patient's medical providers Assessment of cognitive impairment was done Assessed patient's functional ability Established a  written schedule for health screening services Health Risk Assessent Completed and Reviewed  Exercise Activities and Dietary recommendations  Goals      Increase water intake     Recommend increasing water intake to 4-6 glasses a day.         Immunization History  Administered Date(s) Administered   Fluad Quad(high Dose 65+) 11/02/2019, 10/25/2020   Influenza, High Dose Seasonal PF 11/23/2014, 10/23/2015, 10/31/2016, 10/15/2017, 10/21/2018   Moderna Sars-Covid-2 Vaccination 02/23/2019, 03/23/2019, 12/09/2019   Pneumococcal Conjugate-13 12/13/2013   Pneumococcal Polysaccharide-23 06/08/2012   Td 04/28/2003   Zoster Recombinat (Shingrix) 11/09/2019, 03/01/2020    Health Maintenance  Topic Date Due   FOOT EXAM  02/21/2019   OPHTHALMOLOGY EXAM  06/08/2021   COVID-19 Vaccine (4 - Moderna series) 02/13/2022 (Originally 02/03/2020)   TETANUS/TDAP  01/07/2026 (Originally 04/27/2013)   INFLUENZA VACCINE  08/07/2021   HEMOGLOBIN A1C  08/13/2021   Pneumonia Vaccine 10+ Years old  Completed   Zoster Vaccines- Shingrix  Completed   HPV VACCINES  Aged Out     Discussed health benefits of physical activity, and encouraged him to engage in regular exercise appropriate for his age and condition.    ***  No follow-ups on file.     {provider attestation***:1}   [name], MD  [facility] [phone] (phone) [phone] (fax)  [place]

## 2021-06-21 NOTE — Progress Notes (Signed)
*  PRELIMINARY RESULTS* Echocardiogram 2D Echocardiogram has been performed.  Randy Dalton Randy Dalton 06/21/2021, 2:03 PM

## 2021-06-21 NOTE — Assessment & Plan Note (Signed)
  BMI= 34.33   and BW= 90.7 -Diet and exercise.   -Encouraged to lose weight.

## 2021-06-21 NOTE — Assessment & Plan Note (Signed)
-   On BiPAP now

## 2021-06-21 NOTE — Assessment & Plan Note (Addendum)
HR 50-60. CHA2DS2-VASc Score is 5. Consulted Dr. Rockey Situ of card, he recommended to start IV heparin, I did discharge could transition to NOAC  -start IV heparin -continue home coreg 12.5 mg bid -f/u TSH and 2d echo

## 2021-06-21 NOTE — Assessment & Plan Note (Signed)
2D echo on 01/28/2017 showed EF 70% with grade 1 diastolic dysfunction.  Now has CHF exacerbation -Lasix 80 mg bid by IV -2d echo -Daily weights -strict I/O's -Low salt diet -Fluid restriction -Obtain REDs Vest reading

## 2021-06-21 NOTE — Assessment & Plan Note (Signed)
Temperature 94.3 -Bair Hugger

## 2021-06-21 NOTE — Assessment & Plan Note (Addendum)
-  IV hydralazine as needed -Hold coreg, amlodipine, Cozaar and oral hydralazine since patient is at risk of developing hypotension due to sepsis

## 2021-06-21 NOTE — Telephone Encounter (Signed)
Noted  

## 2021-06-21 NOTE — Assessment & Plan Note (Signed)
Stable.  Recent baseline creatinine 1.8-2.0.  His creatinine is 1.92, BUN 49.   -Follow-up with BMP

## 2021-06-21 NOTE — Assessment & Plan Note (Signed)
Synthroid 

## 2021-06-21 NOTE — ED Triage Notes (Signed)
  Pt to ED from home, wife called EMS, pt diaphoretic and SOB,found to be pale and diaphoretic with inspiratory wheezes by EMS. Pt was initially 60% on RA per fire dept who first arrived on scene. Pt up to 98% on 4L by EMS   18g L AC, 1 duoneb and 1 albuterol given by EMS   Provider at bedside, pt has audible wheezing, abdominal breathing, eyes closed. Pt is not DNR at this time, but provider talking to pt who says he would refuse chest compressions. RT on way to place Bipap

## 2021-06-22 DIAGNOSIS — I5033 Acute on chronic diastolic (congestive) heart failure: Secondary | ICD-10-CM | POA: Diagnosis not present

## 2021-06-22 DIAGNOSIS — J209 Acute bronchitis, unspecified: Secondary | ICD-10-CM | POA: Diagnosis not present

## 2021-06-22 DIAGNOSIS — J9601 Acute respiratory failure with hypoxia: Secondary | ICD-10-CM | POA: Diagnosis not present

## 2021-06-22 LAB — BASIC METABOLIC PANEL
Anion gap: 8 (ref 5–15)
BUN: 55 mg/dL — ABNORMAL HIGH (ref 8–23)
CO2: 22 mmol/L (ref 22–32)
Calcium: 9.3 mg/dL (ref 8.9–10.3)
Chloride: 109 mmol/L (ref 98–111)
Creatinine, Ser: 2.1 mg/dL — ABNORMAL HIGH (ref 0.61–1.24)
GFR, Estimated: 29 mL/min — ABNORMAL LOW (ref >60)
Glucose, Bld: 250 mg/dL — ABNORMAL HIGH (ref 70–99)
Potassium: 4.1 mmol/L (ref 3.5–5.1)
Sodium: 139 mmol/L (ref 135–145)

## 2021-06-22 LAB — HEPARIN LEVEL (UNFRACTIONATED)
Heparin Unfractionated: 0.52 IU/mL (ref 0.30–0.70)
Heparin Unfractionated: 0.32 IU/mL (ref 0.30–0.70)

## 2021-06-22 LAB — GLUCOSE, CAPILLARY
Glucose-Capillary: 286 mg/dL — ABNORMAL HIGH (ref 70–99)
Glucose-Capillary: 304 mg/dL — ABNORMAL HIGH (ref 70–99)
Glucose-Capillary: 390 mg/dL — ABNORMAL HIGH (ref 70–99)
Glucose-Capillary: 385 mg/dL — ABNORMAL HIGH (ref 70–99)

## 2021-06-22 LAB — MAGNESIUM: Magnesium: 2.1 mg/dL (ref 1.7–2.4)

## 2021-06-22 MED ORDER — AMLODIPINE BESYLATE 5 MG PO TABS
2.5000 mg | ORAL_TABLET | Freq: Every day | ORAL | Status: DC
  Administered 2021-06-22: 2.5 mg via ORAL
  Filled 2021-06-22: qty 1

## 2021-06-22 MED ORDER — FUROSEMIDE 10 MG/ML IJ SOLN
60.0000 mg | Freq: Two times a day (BID) | INTRAMUSCULAR | Status: DC
  Administered 2021-06-22 – 2021-06-24 (×4): 60 mg via INTRAVENOUS
  Filled 2021-06-22 (×4): qty 6

## 2021-06-22 MED ORDER — IPRATROPIUM-ALBUTEROL 0.5-2.5 (3) MG/3ML IN SOLN
3.0000 mL | Freq: Four times a day (QID) | RESPIRATORY_TRACT | Status: DC
  Administered 2021-06-22 – 2021-06-23 (×3): 3 mL via RESPIRATORY_TRACT
  Filled 2021-06-22 (×3): qty 3

## 2021-06-22 MED ORDER — CARVEDILOL 6.25 MG PO TABS
6.2500 mg | ORAL_TABLET | Freq: Two times a day (BID) | ORAL | Status: DC
  Administered 2021-06-22 – 2021-06-26 (×9): 6.25 mg via ORAL
  Filled 2021-06-22 (×9): qty 1

## 2021-06-22 MED ORDER — AZITHROMYCIN 250 MG PO TABS
500.0000 mg | ORAL_TABLET | Freq: Every day | ORAL | Status: AC
  Administered 2021-06-23 – 2021-06-25 (×3): 500 mg via ORAL
  Filled 2021-06-22 (×3): qty 2

## 2021-06-22 MED ORDER — POLYETHYLENE GLYCOL 3350 17 G PO PACK
17.0000 g | PACK | Freq: Every day | ORAL | Status: DC
  Administered 2021-06-22 – 2021-06-26 (×4): 17 g via ORAL
  Filled 2021-06-22 (×5): qty 1

## 2021-06-22 MED ORDER — CHLORHEXIDINE GLUCONATE CLOTH 2 % EX PADS: 6.0000 | MEDICATED_PAD | Freq: Every day | CUTANEOUS | Status: DC

## 2021-06-22 NOTE — Progress Notes (Signed)
ANTICOAGULATION CONSULT NOTE   Pharmacy Consult for Heparin Indication: atrial fibrillation  Allergies  Allergen Reactions   Aspirin Other (See Comments)    Feel bad Feel bad   Codeine Nausea Only    Patient Measurements: Height: 5\' 4"  (162.6 cm) Weight: 94.9 kg (209 lb 3.5 oz) IBW/kg (Calculated) : 59.2 Heparin Dosing Weight: 80 kg  Labs: Recent Labs    06/21/21 0920 06/21/21 1149 06/22/21 0321  HGB 12.5*  --   --   HCT 38.1*  --   --   PLT 179  --   --   LABPROT 15.2  --   --   INR 1.2  --   --   HEPARINUNFRC  --   --  0.52  CREATININE 1.92*  --  2.10*  TROPONINIHS 24* 24*  --      Estimated Creatinine Clearance: 23.3 mL/min (A) (by C-G formula based on SCr of 2.1 mg/dL (H)).  Assessment: 88 year old male admitted with new onset Afib, pharmacy consulted to assist with systemic heparin anticoagulation. No prior anticoagulation on record - lovenox dc'd prior to initial administration.  Goal of Therapy:  Heparin level 0.3-0.7 units/ml Monitor platelets by anticoagulation protocol: Yes  6/16 0321 HL 0.52, therapeutic x 1   Plan:  Continue heparin infusion at 950 units/hr Recheck HL in 8 hr to confirm CBC daily while on heparin  Renda Rolls, PharmD, [facility] 06/22/2021 6:34 AM

## 2021-06-22 NOTE — Inpatient Diabetes Management (Addendum)
Inpatient Diabetes Program Recommendations  AACE/ADA: New Consensus Statement on Inpatient Glycemic Control (2015)  Target Ranges:  Prepandial:   less than 140 mg/dL      Peak postprandial:   less than 180 mg/dL (1-2 hours)      Critically ill patients:  140 - 180 mg/dL    Latest Reference Range & Units 06/21/21 11:28 06/21/21 15:50 06/21/21 21:29 06/22/21 07:26  Glucose-Capillary 70 - 99 mg/dL 261 (H)  5 units Novolog  125 mg Solumedrol @0939   216 (H)  3 units Novolog @1743  309 (H)  4 units Novolog  286 (H)  (H): Data is abnormally high     Home DM Meds: Amaryl 4 mg daily       Tradjenta 5 mg daily   Current Orders: Novolog Sensitive Correction Scale/ SSI (0-9 units) TID AC + HS    MD- Note patient getting Solumedrol 80 mg daily.  CBG 286 this AM  1. If plan is to keep pt on Solumedrol today, please consider adding basal insulin: Recommend Semglee 10 units Daily to start (0.1 units/kg)  2. Increase Novolog SSI to the 0-15 unit Moderate scale    --Will follow patient during hospitalization--  Wyn Quaker RN, MSN, CDE Diabetes Coordinator Inpatient Glycemic Control Team Team Pager: [phone] (8a-5p)

## 2021-06-22 NOTE — Progress Notes (Signed)
PROGRESS NOTE    Randy Dalton  ORV:[id number] DOB: [birth year] DOA: 06/21/2021 PCP: Jerrol Banana., MD    Assessment & Plan:   Principal Problem:   Acute respiratory failure with hypoxia [facility]) Active Problems:   Acute on chronic diastolic CHF (congestive heart failure) (HCC)   Acute bronchitis   Sepsis ([place])   Essential (primary) hypertension   HLD (hyperlipidemia)   Adult hypothyroidism   Obstructive sleep apnea   Hypothermia   Elevated troponin   Obesity with body mass index (BMI) of 30.0 to 39.9   CKD (chronic kidney disease), stage IV (HCC)   Type II diabetes mellitus with renal manifestations ([place])   New onset atrial fibrillation (HCC)  Assessment and Plan: Acute hypoxic respiratory failure: likely secondary to CHF exacerbation. Weaned off of BiPAP and currently on 4L [place]. Continue on IV lasix    Acute on chronic diastolic CHF: echo in 7614 showed EF 70% with grade 1 diastolic dysfunction.  Continue on IV lasix. Monitor I/Os and daily weights. Echo shows EF 70-92%, diastolic parameters are indeterminate, mild MR, mild-mod TR, & no atrial level shunt detected.   Acute bronchitis: continue on IV rocephin, azithromycin, steroids, bronchodilators. Resp PCR panel was neg.   Sepsis: met criteria w/ leukocytosis, hypothermia, tachypnea & likely acute bronchitis. Continue on IV abxs.   HTN: holding coreg, amlodipine, losartan & hydralazine    HLD: continue on statin    Hypothyroidism: continue on home dose of synthroid   Obstructive sleep apnea: CPAP qhs    Hypothermia: resolved   Elevated troponin: likely secondary to demand ischemia   Obesity: BMI 35.9. Complicates overall care & prognosis    CKDIV: baseline Cr 1.8-2.0.   DM2: poorly controlled, HbA1c 8.0. Continue on SSI w/ accuchecks   A.fib: new onset. CHA2DS2-VASc Score is 5.  Continue on IV heparin. Cardio following and recs apprec.        DVT prophylaxis: heparin  Code Status: DNR Family  Communication: discussed pt's care w/ pt's family at bedside and answered their questions  Disposition Plan: likely d/c back home   Level of care: Progressive  Status is: Inpatient Remains inpatient appropriate because: severity of illness    Consultants:  Cardio   Procedures:   Antimicrobials: rocephin, azithromycin    Subjective: Pt c/o shortness of breath, improved from day prior   Objective: Vitals:   06/22/21 1000 06/22/21 1100 06/22/21 1200 06/22/21 1300  BP: (!) 145/68 127/67 (!) 154/69 (!) 153/71  Pulse: 78 69 68 70  Resp: (!) 22 19 (!) 22 (!) 22  Temp: 98.4 F (36.9 C) 98.6 F (37 C) 98.6 F (37 C) 98.8 F (37.1 C)  TempSrc:      SpO2: 91% 94% 93% 92%  Weight:      Height:        Intake/Output Summary (Last 24 hours) at 06/22/2021 1416 Last data filed at 06/22/2021 1200 Gross per 24 hour  Intake 660.06 ml  Output 3250 ml  Net -2589.94 ml   Filed Weights   06/21/21 0923 06/21/21 1145  Weight: 90.7 kg 94.9 kg    Examination:  General exam: Appears calm and comfortable  Respiratory system: diminished breath sounds b/l  Cardiovascular system: irregularly irregular. No rubs, gallops or clicks. Gastrointestinal system: Abdomen is obese, soft and nontender.  Normal bowel sounds heard. Central nervous system: Alert and oriented. Moves all extremities  Psychiatry: Judgement and insight appear normal. Mood & affect appropriate.     Data Reviewed: I  have personally reviewed following labs and imaging studies  CBC: Recent Labs  Lab 06/21/21 0920  WBC 19.7*  NEUTROABS 17.4*  HGB 12.5*  HCT 38.1*  MCV 95.7  PLT 458   Basic Metabolic Panel: Recent Labs  Lab 06/21/21 0920 06/22/21 0321  NA 138 139  K 4.8 4.1  CL 109 109  CO2 22 22  GLUCOSE 274* 250*  BUN 49* 55*  CREATININE 1.92* 2.10*  CALCIUM 9.6 9.3  MG  --  2.1   GFR: Estimated Creatinine Clearance: 23.3 mL/min (A) (by C-G formula based on SCr of 2.1 mg/dL (H)). Liver Function  Tests: Recent Labs  Lab 06/21/21 0920  AST 15  ALT 16  ALKPHOS 80  BILITOT 1.7*  PROT 7.2  ALBUMIN 4.0   No results for input(s): "LIPASE", "AMYLASE" in the last 168 hours. No results for input(s): "AMMONIA" in the last 168 hours. Coagulation Profile: Recent Labs  Lab 06/21/21 0920  INR 1.2   Cardiac Enzymes: No results for input(s): "CKTOTAL", "CKMB", "CKMBINDEX", "TROPONINI" in the last 168 hours. BNP (last 3 results) No results for input(s): "PROBNP" in the last 8760 hours. HbA1C: No results for input(s): "HGBA1C" in the last 72 hours. CBG: Recent Labs  Lab 06/21/21 1128 06/21/21 1550 06/21/21 2129 06/22/21 0726 06/22/21 1117  GLUCAP 261* 216* 309* 286* 304*   Lipid Profile: No results for input(s): "CHOL", "HDL", "LDLCALC", "TRIG", "CHOLHDL", "LDLDIRECT" in the last 72 hours. Thyroid Function Tests: No results for input(s): "TSH", "T4TOTAL", "FREET4", "T3FREE", "THYROIDAB" in the last 72 hours. Anemia Panel: No results for input(s): "VITAMINB12", "FOLATE", "FERRITIN", "TIBC", "IRON", "RETICCTPCT" in the last 72 hours. Sepsis Labs: Recent Labs  Lab 06/21/21 0920 06/21/21 1149 06/21/21 1446  PROCALCITON 0.24  --   --   LATICACIDVEN  --  1.4 1.2    Recent Results (from the past 240 hour(s))  Culture, blood (routine x 2)     Status: None (Preliminary result)   Collection Time: 06/21/21  9:20 AM   Specimen: BLOOD  Result Value Ref Range Status   Specimen Description BLOOD BLOOD RIGHT ARM  Final   Special Requests   Final    BOTTLES DRAWN AEROBIC AND ANAEROBIC Blood Culture results may not be optimal due to an excessive volume of blood received in culture bottles   Culture   Final    NO GROWTH < 24 HOURS Performed at [facility], [address], [place], [place] [address]    Report Status PENDING  Incomplete  SARS Coronavirus 2 by RT PCR (hospital order, performed in [facility] hospital lab) *cepheid single result test* Anterior Nasal Swab      Status: None   Collection Time: 06/21/21  9:20 AM   Specimen: Anterior Nasal Swab  Result Value Ref Range Status   SARS Coronavirus 2 by RT PCR NEGATIVE NEGATIVE Final    Comment: (NOTE) SARS-CoV-2 target nucleic acids are NOT DETECTED.  The SARS-CoV-2 RNA is generally detectable in upper and lower respiratory specimens during the acute phase of infection. The lowest concentration of SARS-CoV-2 viral copies this assay can detect is 250 copies / mL. A negative result does not preclude SARS-CoV-2 infection and should not be used as the sole basis for treatment or other patient management decisions.  A negative result may occur with improper specimen collection / handling, submission of specimen other than nasopharyngeal swab, presence of viral mutation(s) within the areas targeted by this assay, and inadequate number of viral copies (<250 copies /  mL). A negative result must be combined with clinical observations, patient history, and epidemiological information.  Fact Sheet for Patients:   https://www.patel.info/  Fact Sheet for Healthcare Providers: https://hall.com/  This test is not yet approved or  cleared by the [place] FDA and has been authorized for detection and/or diagnosis of SARS-CoV-2 by FDA under an Emergency Use Authorization (EUA).  This EUA will remain in effect (meaning this test can be used) for the duration of the COVID-19 declaration under Section 564(b)(1) of the Act, 21 U.S.C. section 360bbb-3(b)(1), unless the authorization is terminated or revoked sooner.  Performed at [facility], [address] [place], [place] [address]   Culture, blood (routine x 2)     Status: None (Preliminary result)   Collection Time: 06/21/21  9:28 AM   Specimen: BLOOD  Result Value Ref Range Status   Specimen Description BLOOD RIGHT ANTECUBITAL  Final   Special Requests   Final    BOTTLES DRAWN AEROBIC AND ANAEROBIC  Blood Culture adequate volume   Culture   Final    NO GROWTH < 24 HOURS Performed at [facility], [address] [place], [place] [address]    Report Status PENDING  Incomplete  Respiratory (~20 pathogens) panel by PCR     Status: None   Collection Time: 06/21/21 12:45 PM   Specimen: Nasopharyngeal Swab; Respiratory  Result Value Ref Range Status   Adenovirus NOT DETECTED NOT DETECTED Final   Coronavirus 229E NOT DETECTED NOT DETECTED Final    Comment: (NOTE) The Coronavirus on the Respiratory Panel, DOES NOT test for the novel  Coronavirus (2019 nCoV)    Coronavirus HKU1 NOT DETECTED NOT DETECTED Final   Coronavirus NL63 NOT DETECTED NOT DETECTED Final   Coronavirus OC43 NOT DETECTED NOT DETECTED Final   Metapneumovirus NOT DETECTED NOT DETECTED Final   Rhinovirus / Enterovirus NOT DETECTED NOT DETECTED Final   Influenza A NOT DETECTED NOT DETECTED Final   Influenza B NOT DETECTED NOT DETECTED Final   Parainfluenza Virus 1 NOT DETECTED NOT DETECTED Final   Parainfluenza Virus 2 NOT DETECTED NOT DETECTED Final   Parainfluenza Virus 3 NOT DETECTED NOT DETECTED Final   Parainfluenza Virus 4 NOT DETECTED NOT DETECTED Final   Respiratory Syncytial Virus NOT DETECTED NOT DETECTED Final   Bordetella pertussis NOT DETECTED NOT DETECTED Final   Bordetella Parapertussis NOT DETECTED NOT DETECTED Final   Chlamydophila pneumoniae NOT DETECTED NOT DETECTED Final   Mycoplasma pneumoniae NOT DETECTED NOT DETECTED Final    Comment: Performed at [facility], [place] [address], [place], [place] [address]  MRSA Next Gen by PCR, Nasal     Status: None   Collection Time: 06/21/21 12:45 PM   Specimen: Nasal Mucosa; Nasal Swab  Result Value Ref Range Status   MRSA by PCR Next Gen NOT DETECTED NOT DETECTED Final    Comment: (NOTE) The GeneXpert MRSA Assay (FDA approved for NASAL specimens only), is one component of a comprehensive MRSA colonization surveillance program. It is not  intended to diagnose MRSA infection nor to guide or monitor treatment for MRSA infections. Test performance is not FDA approved in patients less than 59 years old. Performed at [facility], [address] [place], [place] [address]          Radiology Studies: ECHOCARDIOGRAM COMPLETE  Result Date: 06/21/2021    ECHOCARDIOGRAM REPORT   Patient Name:   DEMICO PLOCH Date of Exam: 06/21/2021 Medical Rec #:  [id number]  Height:       64.0 in Accession #:    [id number]    Weight:       209.2 lb Date of Birth:  1927/02/20     BSA:          1.994 m Patient Age:    40 years      BP:           110/53 mmHg Patient Gender: M             HR:           63 bpm. Exam Location:  [facility] Procedure: 2D Echo, Color Doppler and Cardiac Doppler Indications:     I50.31 congestive heart failure-Acute Diastolic  History:         Patient has prior history of Echocardiogram examinations. CHF,                  CKD; Risk Factors:Hypertension, Diabetes, HCL and Sleep Apnea.  Sonographer:     Charmayne Sheer Referring Phys:  [id number] Ivor Costa Diagnosing Phys: Ida Rogue MD  Sonographer Comments: Suboptimal apical window. IMPRESSIONS  1. Left ventricular ejection fraction, by estimation, is 60 to 65%. The left ventricle has normal function. The left ventricle has no regional wall motion abnormalities. Left ventricular diastolic parameters are indeterminate.  2. Right ventricular systolic function is normal. The right ventricular size is normal. There is mildly elevated pulmonary artery systolic pressure. The estimated right ventricular systolic pressure is 62.2 mmHg.  3. The mitral valve is normal in structure. Mild mitral valve regurgitation. No evidence of mitral stenosis.  4. Tricuspid valve regurgitation is mild to moderate.  5. The aortic valve is normal in structure. Aortic valve regurgitation is not visualized. Aortic valve sclerosis/calcification is present, without any evidence of aortic stenosis.  6. Mild pulmonic  stenosis.  7. The inferior vena cava is normal in size with greater than 50% respiratory variability, suggesting right atrial pressure of 3 mmHg. FINDINGS  Left Ventricle: Left ventricular ejection fraction, by estimation, is 60 to 65%. The left ventricle has normal function. The left ventricle has no regional wall motion abnormalities. The left ventricular internal cavity size was normal in size. There is  no left ventricular hypertrophy. Left ventricular diastolic parameters are indeterminate. Right Ventricle: The right ventricular size is normal. No increase in right ventricular wall thickness. Right ventricular systolic function is normal. There is mildly elevated pulmonary artery systolic pressure. The tricuspid regurgitant velocity is 3.14  m/s, and with an assumed right atrial pressure of 5 mmHg, the estimated right ventricular systolic pressure is 63.3 mmHg. Left Atrium: Left atrial size was normal in size. Right Atrium: Right atrial size was normal in size. Pericardium: There is no evidence of pericardial effusion. Mitral Valve: The mitral valve is normal in structure. Mild mitral valve regurgitation. No evidence of mitral valve stenosis. MV peak gradient, 5.3 mmHg. The mean mitral valve gradient is 2.0 mmHg. Tricuspid Valve: The tricuspid valve is normal in structure. Tricuspid valve regurgitation is mild to moderate. No evidence of tricuspid stenosis. Aortic Valve: The aortic valve is normal in structure. Aortic valve regurgitation is not visualized. Aortic valve sclerosis/calcification is present, without any evidence of aortic stenosis. Aortic valve mean gradient measures 8.5 mmHg. Aortic valve peak  gradient measures 15.6 mmHg. Aortic valve area, by VTI measures 1.66 cm. Pulmonic Valve: The pulmonic valve was normal in structure. Pulmonic valve regurgitation is not visualized. Mild pulmonic stenosis. Aorta: The aortic root is normal in size  and structure. Venous: The inferior vena cava is normal in  size with greater than 50% respiratory variability, suggesting right atrial pressure of 3 mmHg. IAS/Shunts: No atrial level shunt detected by color flow Doppler.  LEFT VENTRICLE PLAX 2D LVIDd:         4.33 cm   Diastology LVIDs:         2.29 cm   LV e' medial:    9.36 cm/s LV PW:         1.18 cm   LV E/e' medial:  11.0 LV IVS:        0.80 cm   LV e' lateral:   12.20 cm/s LVOT diam:     2.00 cm   LV E/e' lateral: 8.4 LV SV:         64 LV SV Index:   32 LVOT Area:     3.14 cm  RIGHT VENTRICLE RV Basal diam:  3.76 cm LEFT ATRIUM           Index        RIGHT ATRIUM           Index LA diam:      4.50 cm 2.26 cm/m   RA Area:     19.30 cm LA Vol (A2C): 45.5 ml 22.82 ml/m  RA Volume:   57.20 ml  28.68 ml/m LA Vol (A4C): 55.8 ml 27.98 ml/m  AORTIC VALVE                     PULMONIC VALVE AV Area (Vmax):    1.57 cm      PV Vmax:          0.87 m/s AV Area (Vmean):   1.52 cm      PV Vmean:         52.000 cm/s AV Area (VTI):     1.66 cm      PV VTI:           0.151 m AV Vmax:           197.50 cm/s   PV Peak grad:     3.0 mmHg AV Vmean:          138.000 cm/s  PV Mean grad:     1.0 mmHg AV VTI:            0.389 m       PR End Diast Vel: 4.75 msec AV Peak Grad:      15.6 mmHg AV Mean Grad:      8.5 mmHg LVOT Vmax:         98.40 cm/s LVOT Vmean:        66.900 cm/s LVOT VTI:          0.205 m LVOT/AV VTI ratio: 0.53  AORTA Ao Root diam: 3.50 cm MITRAL VALVE                TRICUSPID VALVE MV Area (PHT): 3.53 cm     TR Peak grad:   39.4 mmHg MV Area VTI:   2.52 cm     TR Vmax:        314.00 cm/s MV Peak grad:  5.3 mmHg MV Mean grad:  2.0 mmHg     SHUNTS MV Vmax:       1.15 m/s     Systemic VTI:  0.20 m MV Vmean:      63.3 cm/s    Systemic Diam: 2.00 cm MV Decel Time: 215 msec MV  E velocity: 102.70 cm/s Ida Rogue MD Electronically signed by Ida Rogue MD Signature Date/Time: 06/21/2021/3:09:58 PM    Final    DG Chest Portable 1 View  Result Date: 06/21/2021 CLINICAL DATA:  Shortness of breath. EXAM: PORTABLE CHEST  1 VIEW COMPARISON:  Chest radiographs 01/29/2017 FINDINGS: The patient is rotated to the right. The cardiac silhouette is mildly enlarged. There are symmetric bilateral interstitial opacities which are greater than on the prior study, and there are small bilateral pleural effusions. No pneumothorax is identified. No acute osseous abnormality is seen. IMPRESSION: Cardiomegaly and bilateral interstitial opacities compatible with edema. Small pleural effusions. Electronically Signed   By: Logan Bores M.D.   On: 06/21/2021 09:53        Scheduled Meds:  amLODipine  2.5 mg Oral Daily   [START ON 06/23/2021] azithromycin  500 mg Oral Daily   carvedilol  6.25 mg Oral BID WC   Chlorhexidine Gluconate Cloth  6 each Topical Daily   cholecalciferol  2,000 Units Oral Daily   furosemide  60 mg Intravenous Q12H   insulin aspart  0-5 Units Subcutaneous QHS   insulin aspart  0-9 Units Subcutaneous TID WC   ipratropium-albuterol  3 mL Nebulization Q6H   levothyroxine  175 mcg Oral Q0600   methylPREDNISolone (SOLU-MEDROL) injection  80 mg Intravenous Daily   multivitamin with minerals  1 tablet Oral Daily   mouth rinse  15 mL Mouth Rinse 4 times per day   simvastatin  20 mg Oral q1800   Continuous Infusions:  cefTRIAXone (ROCEPHIN)  IV Stopped (06/22/21 1124)   heparin 950 Units/hr (06/22/21 1200)     LOS: 1 day    Time spent: 35 mins     Wyvonnia Dusky, MD [facility] Pager [phone]  If 7PM-7AM, please contact night-coverage 06/22/2021, 2:16 PM

## 2021-06-22 NOTE — Progress Notes (Signed)
Progress Note  Patient Name: Randy Dalton Date of Encounter: 06/22/2021  [facility] Cardiologist: new Rockey Situ)  Subjective   He reports significant improvement in shortness of breath.  He had excellent urine output with furosemide and he is -2.7 L for the admission.  He continues to be in atrial fibrillation with controlled ventricular rate.  Inpatient Medications    Scheduled Meds:  Chlorhexidine Gluconate Cloth  6 each Topical Daily   cholecalciferol  2,000 Units Oral Daily   furosemide  60 mg Intravenous Q12H   insulin aspart  0-5 Units Subcutaneous QHS   insulin aspart  0-9 Units Subcutaneous TID WC   ipratropium-albuterol  3 mL Nebulization Q6H   levothyroxine  175 mcg Oral Q0600   methylPREDNISolone (SOLU-MEDROL) injection  80 mg Intravenous Daily   multivitamin with minerals  1 tablet Oral Daily   mouth rinse  15 mL Mouth Rinse 4 times per day   simvastatin  20 mg Oral q1800   Continuous Infusions:  azithromycin (ZITHROMAX) 500 mg in sodium chloride 0.9 % 250 mL IVPB 500 mg (06/22/21 0945)   cefTRIAXone (ROCEPHIN)  IV     heparin 950 Units/hr (06/21/21 2003)   PRN Meds: acetaminophen, albuterol, dextromethorphan-guaiFENesin, hydrALAZINE, ondansetron (ZOFRAN) IV, mouth rinse   Vital Signs    Vitals:   06/22/21 0400 06/22/21 0700 06/22/21 0721 06/22/21 0800  BP: 124/64 (!) 148/77  (!) 188/95  Pulse: 70 77 74 92  Resp: 17 18 16  (!) 22  Temp: 97.9 F (36.6 C) 97.9 F (36.6 C) 97.9 F (36.6 C) 97.9 F (36.6 C)  TempSrc: Rectal     SpO2: 94% 95% 93% 93%  Weight:      Height:        Intake/Output Summary (Last 24 hours) at 06/22/2021 1040 Last data filed at 06/22/2021 0942 Gross per 24 hour  Intake 476.71 ml  Output 3250 ml  Net -2773.29 ml      06/21/2021   11:45 AM 06/21/2021    9:23 AM 02/21/2021    1:45 PM  Last 3 Weights  Weight (lbs) 209 lb 3.5 oz 200 lb 208 lb  Weight (kg) 94.9 kg 90.719 kg 94.348 kg      Telemetry    Atrial  fibrillation with ventricular rate in the 70s and 80s.- Personally Reviewed  ECG     - Personally Reviewed  Physical Exam   GEN: No acute distress.   Neck: Mild JVD Cardiac: Irregularly irregular, no , rubs, or gallops.  2 out of 6 systolic murmur in the aortic area. Respiratory: Clear to auscultation bilaterally. GI: Soft, nontender, non-distended  MS: No deformity.  Moderate bilateral leg edema. Neuro:  Nonfocal  Psych: Normal affect   Labs    High Sensitivity Troponin:   Recent Labs  Lab 06/21/21 0920 06/21/21 1149  TROPONINIHS 24* 24*     Chemistry Recent Labs  Lab 06/21/21 0920 06/22/21 0321  NA 138 139  K 4.8 4.1  CL 109 109  CO2 22 22  GLUCOSE 274* 250*  BUN 49* 55*  CREATININE 1.92* 2.10*  CALCIUM 9.6 9.3  MG  --  2.1  PROT 7.2  --   ALBUMIN 4.0  --   AST 15  --   ALT 16  --   ALKPHOS 80  --   BILITOT 1.7*  --   GFRNONAA 32* 29*  ANIONGAP 7 8    Lipids No results for input(s): "CHOL", "TRIG", "HDL", "LABVLDL", "LDLCALC", "CHOLHDL" in  the last 168 hours.  Hematology Recent Labs  Lab 06/21/21 0920  WBC 19.7*  RBC 3.98*  HGB 12.5*  HCT 38.1*  MCV 95.7  MCH 31.4  MCHC 32.8  RDW 14.0  PLT 179   Thyroid No results for input(s): "TSH", "FREET4" in the last 168 hours.  BNP Recent Labs  Lab 06/21/21 0920  BNP 562.0*    DDimer No results for input(s): "DDIMER" in the last 168 hours.   Radiology    ECHOCARDIOGRAM COMPLETE  Result Date: 06/21/2021    ECHOCARDIOGRAM REPORT   Patient Name:   DEVON PRETTY Date of Exam: 06/21/2021 Medical Rec #:  [id number]     Height:       64.0 in Accession #:    [id number]    Weight:       209.2 lb Date of Birth:  October 19, 1927     BSA:          1.994 m Patient Age:    88 years      BP:           110/53 mmHg Patient Gender: M             HR:           63 bpm. Exam Location:  [facility] Procedure: 2D Echo, Color Doppler and Cardiac Doppler Indications:     I50.31 congestive heart failure-Acute Diastolic  History:          Patient has prior history of Echocardiogram examinations. CHF,                  CKD; Risk Factors:Hypertension, Diabetes, HCL and Sleep Apnea.  Sonographer:     Charmayne Sheer Referring Phys:  [id number] Ivor Costa Diagnosing Phys: Ida Rogue MD  Sonographer Comments: Suboptimal apical window. IMPRESSIONS  1. Left ventricular ejection fraction, by estimation, is 60 to 65%. The left ventricle has normal function. The left ventricle has no regional wall motion abnormalities. Left ventricular diastolic parameters are indeterminate.  2. Right ventricular systolic function is normal. The right ventricular size is normal. There is mildly elevated pulmonary artery systolic pressure. The estimated right ventricular systolic pressure is 02.6 mmHg.  3. The mitral valve is normal in structure. Mild mitral valve regurgitation. No evidence of mitral stenosis.  4. Tricuspid valve regurgitation is mild to moderate.  5. The aortic valve is normal in structure. Aortic valve regurgitation is not visualized. Aortic valve sclerosis/calcification is present, without any evidence of aortic stenosis.  6. Mild pulmonic stenosis.  7. The inferior vena cava is normal in size with greater than 50% respiratory variability, suggesting right atrial pressure of 3 mmHg. FINDINGS  Left Ventricle: Left ventricular ejection fraction, by estimation, is 60 to 65%. The left ventricle has normal function. The left ventricle has no regional wall motion abnormalities. The left ventricular internal cavity size was normal in size. There is  no left ventricular hypertrophy. Left ventricular diastolic parameters are indeterminate. Right Ventricle: The right ventricular size is normal. No increase in right ventricular wall thickness. Right ventricular systolic function is normal. There is mildly elevated pulmonary artery systolic pressure. The tricuspid regurgitant velocity is 3.14  m/s, and with an assumed right atrial pressure of 5 mmHg, the estimated right  ventricular systolic pressure is 37.8 mmHg. Left Atrium: Left atrial size was normal in size. Right Atrium: Right atrial size was normal in size. Pericardium: There is no evidence of pericardial effusion. Mitral Valve: The mitral valve is normal in structure. Mild  mitral valve regurgitation. No evidence of mitral valve stenosis. MV peak gradient, 5.3 mmHg. The mean mitral valve gradient is 2.0 mmHg. Tricuspid Valve: The tricuspid valve is normal in structure. Tricuspid valve regurgitation is mild to moderate. No evidence of tricuspid stenosis. Aortic Valve: The aortic valve is normal in structure. Aortic valve regurgitation is not visualized. Aortic valve sclerosis/calcification is present, without any evidence of aortic stenosis. Aortic valve mean gradient measures 8.5 mmHg. Aortic valve peak  gradient measures 15.6 mmHg. Aortic valve area, by VTI measures 1.66 cm. Pulmonic Valve: The pulmonic valve was normal in structure. Pulmonic valve regurgitation is not visualized. Mild pulmonic stenosis. Aorta: The aortic root is normal in size and structure. Venous: The inferior vena cava is normal in size with greater than 50% respiratory variability, suggesting right atrial pressure of 3 mmHg. IAS/Shunts: No atrial level shunt detected by color flow Doppler.  LEFT VENTRICLE PLAX 2D LVIDd:         4.33 cm   Diastology LVIDs:         2.29 cm   LV e' medial:    9.36 cm/s LV PW:         1.18 cm   LV E/e' medial:  11.0 LV IVS:        0.80 cm   LV e' lateral:   12.20 cm/s LVOT diam:     2.00 cm   LV E/e' lateral: 8.4 LV SV:         64 LV SV Index:   32 LVOT Area:     3.14 cm  RIGHT VENTRICLE RV Basal diam:  3.76 cm LEFT ATRIUM           Index        RIGHT ATRIUM           Index LA diam:      4.50 cm 2.26 cm/m   RA Area:     19.30 cm LA Vol (A2C): 45.5 ml 22.82 ml/m  RA Volume:   57.20 ml  28.68 ml/m LA Vol (A4C): 55.8 ml 27.98 ml/m  AORTIC VALVE                     PULMONIC VALVE AV Area (Vmax):    1.57 cm      PV Vmax:           0.87 m/s AV Area (Vmean):   1.52 cm      PV Vmean:         52.000 cm/s AV Area (VTI):     1.66 cm      PV VTI:           0.151 m AV Vmax:           197.50 cm/s   PV Peak grad:     3.0 mmHg AV Vmean:          138.000 cm/s  PV Mean grad:     1.0 mmHg AV VTI:            0.389 m       PR End Diast Vel: 4.75 msec AV Peak Grad:      15.6 mmHg AV Mean Grad:      8.5 mmHg LVOT Vmax:         98.40 cm/s LVOT Vmean:        66.900 cm/s LVOT VTI:          0.205 m LVOT/AV VTI ratio: 0.53  AORTA Ao Root  diam: 3.50 cm MITRAL VALVE                TRICUSPID VALVE MV Area (PHT): 3.53 cm     TR Peak grad:   39.4 mmHg MV Area VTI:   2.52 cm     TR Vmax:        314.00 cm/s MV Peak grad:  5.3 mmHg MV Mean grad:  2.0 mmHg     SHUNTS MV Vmax:       1.15 m/s     Systemic VTI:  0.20 m MV Vmean:      63.3 cm/s    Systemic Diam: 2.00 cm MV Decel Time: 215 msec MV E velocity: 102.70 cm/s Ida Rogue MD Electronically signed by Ida Rogue MD Signature Date/Time: 06/21/2021/3:09:58 PM    Final    DG Chest Portable 1 View  Result Date: 06/21/2021 CLINICAL DATA:  Shortness of breath. EXAM: PORTABLE CHEST 1 VIEW COMPARISON:  Chest radiographs 01/29/2017 FINDINGS: The patient is rotated to the right. The cardiac silhouette is mildly enlarged. There are symmetric bilateral interstitial opacities which are greater than on the prior study, and there are small bilateral pleural effusions. No pneumothorax is identified. No acute osseous abnormality is seen. IMPRESSION: Cardiomegaly and bilateral interstitial opacities compatible with edema. Small pleural effusions. Electronically Signed   By: Logan Bores M.D.   On: 06/21/2021 09:53    Cardiac Studies   Echocardiogram was done yesterday and was personally reviewed by me.  It showed normal LV systolic function with indeterminant diastolic function due to atrial fibrillation, moderate pulmonary hypertension, mild to moderate mitral regurgitation, calcified aortic valve without  significant stenosis and significantly dilated IVC at 2.4 cm with no respiratory variation suggestive of right atrial pressure of at least 15 mmHg.  Patient Profile     88 y.o. male with a hx of Hypertension, hyperlipidemia, diabetes, GERD, sleep apnea, chronic kidney disease stage IV, diastolic CHF presenting with worsening shortness of breath and was found to be in atrial fibrillation.  Assessment & Plan    1.  Atrial fibrillation: Unknown duration.  The last EKG we have on him in the system was in 2019 and he was in sinus rhythm at that time.  I suspect that atrial fibrillation is contributing to his diastolic heart failure. His ventricular rate is controlled and thus I recommend continuing carvedilol. Continue unfractionated heparin and switch to Eliquis 2.5 mg twice daily once no plans for any invasive procedures. Given his age and unknown duration of atrial fibrillation, I think the best option is to continue with rate control and anticoagulation.  2.  Acute diastolic heart failure: His echocardiogram is suggestive of significant volume overload.  He had excellent urine output with furosemide and I elected to decrease the dose to 60 mg twice daily to avoid overdiuresis.  He does have underlying chronic kidney disease.  Monitor renal function closely.  SGLT2 inhibitors might not be the best option given his stage IV chronic kidney disease.  3.  Essential hypertension: Blood pressure is running high but his blood pressure medications were held.  As an outpatient, he was on carvedilol, amlodipine, hydralazine and losartan.  I am going to resume carvedilol and amlodipine for now.  Total encounter time more than 50 minutes. Greater than 50% was spent in counseling and coordination of care with the patient.     For questions or updates, please contact [place] Please consult [email or web address] for contact info under  Signed, Kathlyn Sacramento, MD  06/22/2021, 10:40 AM

## 2021-06-22 NOTE — Progress Notes (Signed)
PHARMACIST - PHYSICIAN COMMUNICATION  CONCERNING: Antibiotic IV to Oral Route Change Policy  RECOMMENDATION: This patient is receiving azithromycin by the intravenous route.  Based on criteria approved by the Pharmacy and Therapeutics Committee, the antibiotic(s) is/are being converted to the equivalent oral dose form(s).   DESCRIPTION: These criteria include: Patient being treated for a respiratory tract infection, urinary tract infection, cellulitis or clostridium difficile associated diarrhea if on metronidazole The patient is not neutropenic and does not exhibit a GI malabsorption state The patient is eating (either orally or via tube) and/or has been taking other orally administered medications for a least 24 hours The patient is improving clinically and has a Tmax < 100.5  If you have questions about this conversion, please contact the [place]  06/22/21

## 2021-06-22 NOTE — Progress Notes (Signed)
ANTICOAGULATION CONSULT NOTE   Pharmacy Consult for IV Heparin Indication: atrial fibrillation  Patient Measurements: Height: 5\' 4"  (162.6 cm) Weight: 94.9 kg (209 lb 3.5 oz) IBW/kg (Calculated) : 59.2 Heparin Dosing Weight: 80 kg  Labs: Recent Labs    06/21/21 0920 06/21/21 1149 06/22/21 0321 06/22/21 1132  HGB 12.5*  --   --   --   HCT 38.1*  --   --   --   PLT 179  --   --   --   LABPROT 15.2  --   --   --   INR 1.2  --   --   --   HEPARINUNFRC  --   --  0.52 0.32  CREATININE 1.92*  --  2.10*  --   TROPONINIHS 24* 24*  --   --     Estimated Creatinine Clearance: 23.3 mL/min (A) (by C-G formula based on SCr of 2.1 mg/dL (H)).  Assessment: 88 year old male admitted with new onset Afib, pharmacy consulted to assist with systemic heparin anticoagulation. No prior anticoagulation on record - lovenox dc'd prior to initial administration.  Goal of Therapy:  Heparin level 0.3-0.7 units/ml Monitor platelets by anticoagulation protocol: Yes  6/16 0321 HL 0.52, therapeutic x 1 6/16 1132 HL 0.32, therapeutic x 2   Plan:  Continue heparin infusion at 950 units/hr HL tomorrow AM CBC daily while on heparin  Benita Gutter  06/22/2021 12:32 PM

## 2021-06-23 ENCOUNTER — Other Ambulatory Visit: Payer: Self-pay | Admitting: Family Medicine

## 2021-06-23 DIAGNOSIS — J9601 Acute respiratory failure with hypoxia: Secondary | ICD-10-CM | POA: Diagnosis not present

## 2021-06-23 DIAGNOSIS — E669 Obesity, unspecified: Secondary | ICD-10-CM | POA: Diagnosis not present

## 2021-06-23 DIAGNOSIS — I5033 Acute on chronic diastolic (congestive) heart failure: Secondary | ICD-10-CM | POA: Diagnosis not present

## 2021-06-23 LAB — BASIC METABOLIC PANEL
Anion gap: 9 (ref 5–15)
BUN: 67 mg/dL — ABNORMAL HIGH (ref 8–23)
CO2: 24 mmol/L (ref 22–32)
Calcium: 9.3 mg/dL (ref 8.9–10.3)
Chloride: 102 mmol/L (ref 98–111)
Creatinine, Ser: 2.1 mg/dL — ABNORMAL HIGH (ref 0.61–1.24)
GFR, Estimated: 29 mL/min — ABNORMAL LOW (ref >60)
Glucose, Bld: 308 mg/dL — ABNORMAL HIGH (ref 70–99)
Potassium: 4.3 mmol/L (ref 3.5–5.1)
Sodium: 135 mmol/L (ref 135–145)

## 2021-06-23 LAB — CBC
HCT: 34.6 % — ABNORMAL LOW (ref 39.0–52.0)
Hemoglobin: 11.8 g/dL — ABNORMAL LOW (ref 13.0–17.0)
MCH: 31.6 pg (ref 26.0–34.0)
MCHC: 34.1 g/dL (ref 30.0–36.0)
MCV: 92.8 fL (ref 80.0–100.0)
Platelets: 177 10*3/uL (ref 150–400)
RBC: 3.73 MIL/uL — ABNORMAL LOW (ref 4.22–5.81)
RDW: 13.8 % (ref 11.5–15.5)
WBC: 19.1 10*3/uL — ABNORMAL HIGH (ref 4.0–10.5)
nRBC: 0 % (ref 0.0–0.2)

## 2021-06-23 LAB — GLUCOSE, CAPILLARY
Glucose-Capillary: 308 mg/dL — ABNORMAL HIGH (ref 70–99)
Glucose-Capillary: 273 mg/dL — ABNORMAL HIGH (ref 70–99)
Glucose-Capillary: 373 mg/dL — ABNORMAL HIGH (ref 70–99)
Glucose-Capillary: 377 mg/dL — ABNORMAL HIGH (ref 70–99)

## 2021-06-23 LAB — HEPARIN LEVEL (UNFRACTIONATED): Heparin Unfractionated: 0.31 IU/mL (ref 0.30–0.70)

## 2021-06-23 MED ORDER — IPRATROPIUM-ALBUTEROL 0.5-2.5 (3) MG/3ML IN SOLN
3.0000 mL | Freq: Three times a day (TID) | RESPIRATORY_TRACT | Status: DC
  Administered 2021-06-23 – 2021-06-24 (×3): 3 mL via RESPIRATORY_TRACT
  Filled 2021-06-23 (×3): qty 3

## 2021-06-23 MED ORDER — APIXABAN 2.5 MG PO TABS
2.5000 mg | ORAL_TABLET | Freq: Two times a day (BID) | ORAL | Status: DC
  Administered 2021-06-23 – 2021-06-26 (×7): 2.5 mg via ORAL
  Filled 2021-06-23 (×7): qty 1

## 2021-06-23 MED ORDER — AMLODIPINE BESYLATE 5 MG PO TABS
5.0000 mg | ORAL_TABLET | Freq: Every day | ORAL | Status: DC
  Administered 2021-06-23 – 2021-06-26 (×4): 5 mg via ORAL
  Filled 2021-06-23 (×4): qty 1

## 2021-06-23 NOTE — Progress Notes (Signed)
Cardiology Progress Note   Patient Name: Randy Dalton Date of Encounter: 06/23/2021  Primary Cardiologist: Laurelyn Sickle, MD  Subjective   Breathing much improved.  No chest pain or dyspnea.  Family @ bedside.  Inpatient Medications    Scheduled Meds:  amLODipine  5 mg Oral Daily   azithromycin  500 mg Oral Daily   carvedilol  6.25 mg Oral BID WC   Chlorhexidine Gluconate Cloth  6 each Topical Daily   cholecalciferol  2,000 Units Oral Daily   furosemide  60 mg Intravenous Q12H   insulin aspart  0-5 Units Subcutaneous QHS   insulin aspart  0-9 Units Subcutaneous TID WC   ipratropium-albuterol  3 mL Nebulization Q6H   levothyroxine  175 mcg Oral Q0600   methylPREDNISolone (SOLU-MEDROL) injection  80 mg Intravenous Daily   multivitamin with minerals  1 tablet Oral Daily   mouth rinse  15 mL Mouth Rinse 4 times per day   polyethylene glycol  17 g Oral Daily   simvastatin  20 mg Oral q1800   Continuous Infusions:  cefTRIAXone (ROCEPHIN)  IV Stopped (06/22/21 1124)   heparin 950 Units/hr (06/22/21 1831)   PRN Meds: acetaminophen, albuterol, dextromethorphan-guaiFENesin, hydrALAZINE, ondansetron (ZOFRAN) IV, mouth rinse   Vital Signs    Vitals:   06/22/21 2027 06/23/21 0020 06/23/21 0529 06/23/21 0757  BP:  (!) 142/78 138/68 (!) 151/80  Pulse:  78 63 65  Resp:  19 19 17   Temp:  98 F (36.7 C) (!) 97.5 F (36.4 C) 98 F (36.7 C)  TempSrc:  Oral Oral Oral  SpO2: 94% 95% 95% 93%  Weight:   89.4 kg   Height:        Intake/Output Summary (Last 24 hours) at 06/23/2021 0903 Last data filed at 06/23/2021 0757 Gross per 24 hour  Intake 540.06 ml  Output 4450 ml  Net -3909.94 ml   Filed Weights   06/21/21 0923 06/21/21 1145 06/23/21 0529  Weight: 90.7 kg 94.9 kg 89.4 kg    Physical Exam   GEN: Well nourished, well developed, in no acute distress.  HEENT: Grossly normal.  Neck: Supple, no carotid bruits or masses.  Difficult to gauge JVP 2/2 body habitus. Cardiac: IR,  IR, no murmurs, rubs, or gallops. No clubbing, cyanosis, 1+ bilat LE edema.  Radials 2+, DP/PT 2+ and equal bilaterally.  Respiratory:  Respirations regular and unlabored, diminished breath sounds @ bases w/ diffuse exp wheezing.   GI: Soft, nontender, nondistended, BS + x 4. MS: no deformity or atrophy. Skin: warm and dry, no rash. Neuro:  Strength and sensation are intact. Psych: AAOx3.  Normal affect.  Labs    Chemistry Recent Labs  Lab 06/21/21 0920 06/22/21 0321 06/23/21 0425  NA 138 139 135  K 4.8 4.1 4.3  CL 109 109 102  CO2 22 22 24   GLUCOSE 274* 250* 308*  BUN 49* 55* 67*  CREATININE 1.92* 2.10* 2.10*  CALCIUM 9.6 9.3 9.3  PROT 7.2  --   --   ALBUMIN 4.0  --   --   AST 15  --   --   ALT 16  --   --   ALKPHOS 80  --   --   BILITOT 1.7*  --   --   GFRNONAA 32* 29* 29*  ANIONGAP 7 8 9      Hematology Recent Labs  Lab 06/21/21 0920 06/23/21 0425  WBC 19.7* 19.1*  RBC 3.98* 3.73*  HGB 12.5* 11.8*  HCT 38.1* 34.6*  MCV 95.7 92.8  MCH 31.4 31.6  MCHC 32.8 34.1  RDW 14.0 13.8  PLT 179 177    Cardiac Enzymes  Recent Labs  Lab 06/21/21 0920 06/21/21 1149  TROPONINIHS 24* 24*      BNP    Component Value Date/Time   BNP 562.0 (H) 06/21/2021 0920   Lipids  Lab Results  Component Value Date   CHOL 151 06/13/2020   HDL 39 (L) 06/13/2020   LDLCALC 88 06/13/2020   TRIG 133 06/13/2020   CHOLHDL 3.9 06/13/2020    HbA1c  Lab Results  Component Value Date   HGBA1C 8.0 (A) 02/13/2021    Radiology    DG Chest Portable 1 View  Result Date: 06/21/2021 CLINICAL DATA:  Shortness of breath. EXAM: PORTABLE CHEST 1 VIEW COMPARISON:  Chest radiographs 01/29/2017 FINDINGS: The patient is rotated to the right. The cardiac silhouette is mildly enlarged. There are symmetric bilateral interstitial opacities which are greater than on the prior study, and there are small bilateral pleural effusions. No pneumothorax is identified. No acute osseous abnormality is  seen. IMPRESSION: Cardiomegaly and bilateral interstitial opacities compatible with edema. Small pleural effusions. Electronically Signed   By: Logan Bores M.D.   On: 06/21/2021 09:53    Telemetry    Afib, 60's - Personally Reviewed  Cardiac Studies   2D Echocardiogram 6.15.2023  1. Left ventricular ejection fraction, by estimation, is 60 to 65%. The  left ventricle has normal function. The left ventricle has no regional  wall motion abnormalities. Left ventricular diastolic parameters are  indeterminate.   2. Right ventricular systolic function is normal. The right ventricular  size is normal. There is mildly elevated pulmonary artery systolic  pressure. The estimated right ventricular systolic pressure is 66.0 mmHg.   3. The mitral valve is normal in structure. Mild mitral valve  regurgitation. No evidence of mitral stenosis.   4. Tricuspid valve regurgitation is mild to moderate.   5. The aortic valve is normal in structure. Aortic valve regurgitation is  not visualized. Aortic valve sclerosis/calcification is present, without  any evidence of aortic stenosis.   6. Mild pulmonic stenosis.   7. The inferior vena cava is normal in size with greater than 50%  respiratory variability, suggesting right atrial pressure of 3 mmHg.   Patient Profile     88 y.o. male with a hx of hypertension, hyperlipidemia, diabetes, GERD, sleep apnea, chronic kidney disease stage IV, and HFpEF who presented 6/15 with worsening shortness of breath and was found to be in atrial fibrillation.  Assessment & Plan    1.  Persistent Atrial Fibrillation: unknown duration.  Followed by Dr. Humphrey Rolls as outpt.  Rates well-controlled on ? blocker therapy. CHA2DSVASc = 4.  Will transition heparin to eliquis 2.5 bid.  Plan for ongoing rate control given advanced age.  2.  Acute on chronic HFpEF:  EF 60-65% by echo this admission.  RVSP 44.4 mmHg, mild MR< mild-mod TR, mild PS.  Volume overloaded on admission - ? Role  of afib.  Has responded well to IV lasix (dose reduced to 60 BID on 6/16).  Minus 3.4 L yesterday and minus 5.9 L since admission.  Wt down to 89.4 kg this AM, which is significantly lower than recorded weights earlier in the year.  Still volume overloaded w/ lower ext edema on exam.  BUN rising, creat stable.  Cont IV diuresis today.  HRs stable.  BP's could be better - amlodipine increased to  5mg  daily - watch for inc in lower ext edema.  Per dtr, pt was noncompliant w/ outpt diuretic.  3.  Essential HTN:  As above, trending up.  Amlodipine increased to 5 mg daily today.  Cont IV lasix and carvedilol.  4.  CKD IV: BUN 67 this AM. Creat stable @ 2.10.  Follow w/ ongoing diuresis.  5.  Bronchitis:  Wheezing on exam.  Abx/nebs/steroids per IM.  6.  DMII:  Insulin mgmt per IM.  Signed, Murray Hodgkins, NP  06/23/2021, 9:03 AM    For questions or updates, please contact   Please consult [email or web address] for contact info under Cardiology/STEMI.

## 2021-06-23 NOTE — Progress Notes (Signed)
ANTICOAGULATION CONSULT NOTE   Pharmacy Consult for IV Heparin Indication: atrial fibrillation  Patient Measurements: Height: 5\' 4"  (162.6 cm) Weight: 94.9 kg (209 lb 3.5 oz) IBW/kg (Calculated) : 59.2 Heparin Dosing Weight: 80 kg  Labs: Recent Labs    06/21/21 0920 06/21/21 1149 06/22/21 0321 06/22/21 1132 06/23/21 0425  HGB 12.5*  --   --   --  11.8*  HCT 38.1*  --   --   --  34.6*  PLT 179  --   --   --  177  LABPROT 15.2  --   --   --   --   INR 1.2  --   --   --   --   HEPARINUNFRC  --   --  0.52 0.32 0.31  CREATININE 1.92*  --  2.10*  --  2.10*  TROPONINIHS 24* 24*  --   --   --     Estimated Creatinine Clearance: 23.3 mL/min (A) (by C-G formula based on SCr of 2.1 mg/dL (H)).  Assessment: 88 year old male admitted with new onset Afib, pharmacy consulted to assist with systemic heparin anticoagulation. No prior anticoagulation on record - lovenox dc'd prior to initial administration.  Goal of Therapy:  Heparin level 0.3-0.7 units/ml Monitor platelets by anticoagulation protocol: Yes  6/16 0321 HL 0.52, therapeutic x 1 6/16 1132 HL 0.32, therapeutic x 2 6/17 0425 HL 0.31, therapeutic x 3   Plan:  Continue heparin infusion at 950 units/hr HL tomorrow AM CBC daily while on heparin  Renda Rolls, PharmD, [facility] 06/23/2021 6:03 AM

## 2021-06-23 NOTE — Progress Notes (Signed)
PROGRESS NOTE    Randy Dalton  GEZ:[id number] DOB: [birth year] DOA: 06/21/2021 PCP: Jerrol Banana., MD    Assessment & Plan:   Principal Problem:   Acute respiratory failure with hypoxia [facility]) Active Problems:   Acute on chronic diastolic CHF (congestive heart failure) (HCC)   Acute bronchitis   Sepsis ([place])   Essential (primary) hypertension   HLD (hyperlipidemia)   Adult hypothyroidism   Obstructive sleep apnea   Hypothermia   Elevated troponin   Obesity with body mass index (BMI) of 30.0 to 39.9   CKD (chronic kidney disease), stage IV (HCC)   Type II diabetes mellitus with renal manifestations ([place])   New onset atrial fibrillation (HCC)  Assessment and Plan: Acute hypoxic respiratory failure: likely secondary to CHF exacerbation. Weaned off of BiPAP and currently on 4L [place]. Continue on supplemental oxygen and wean as tolerated. Continue on IV lasix    Acute on chronic diastolic CHF: echo in 6503 showed EF 70% with grade 1 diastolic dysfunction. Echo shows EF 54-65%, diastolic parameters are indeterminate, mild MR, mild-mod TR, & no atrial level shunt detected. Continue on IV lasix. Monitor daily weights & I/Os  Acute bronchitis: continue on IV rocephin, azithromycin, steroids, bronchodilators. Encourage incentive spirometry. Resp PCR panel was neg   Sepsis: met criteria w/ leukocytosis, hypothermia, tachypnea & likely acute bronchitis. Continue on IV abxs.   HTN: restart coreg, amlodipine. Hold losartan, hydralazine    HLD: continue on statin    Hypothyroidism: continue on home dose of synthroid   Obstructive sleep apnea: CPAP qhs    Hypothermia: resolved   Elevated troponin: likely secondary to demand ischemia   Obesity: BMI 33.8. Complicates overall care & prognosis    CKDIV: baseline Cr 1.8-2.0. Cr is stable from day prior   DM2: HbA1c 8.0, poorly controlled. Continue on SSI w/ accuchecks    A.fib: new onset. CHA2DS2-VASc Score is 5.  D/c IV heparin  and started on eliquis. Continue on coreg.        DVT prophylaxis: heparin  Code Status: DNR Family Communication: discussed pt's care w/ pt's family at bedside and answered their questions  Disposition Plan: likely d/c back home   Level of care: Progressive  Status is: Inpatient Remains inpatient appropriate because: severity of illness    Consultants:  Cardio   Procedures:   Antimicrobials: rocephin, azithromycin    Subjective: Pt c/o malaise    Objective: Vitals:   06/22/21 1927 06/22/21 2027 06/23/21 0020 06/23/21 0529  BP: (!) 143/72  (!) 142/78 138/68  Pulse: (!) 56  78 63  Resp: 20  19 19   Temp: 97.8 F (36.6 C)  98 F (36.7 C) (!) 97.5 F (36.4 C)  TempSrc: Oral  Oral Oral  SpO2: 94% 94% 95% 95%  Weight:    89.4 kg  Height:        Intake/Output Summary (Last 24 hours) at 06/23/2021 0738 Last data filed at 06/23/2021 0500 Gross per 24 hour  Intake 540.06 ml  Output 3950 ml  Net -3409.94 ml   Filed Weights   06/21/21 0923 06/21/21 1145 06/23/21 0529  Weight: 90.7 kg 94.9 kg 89.4 kg    Examination:  General exam: Appears comfortable  Respiratory system: decreased breath sounds b/l  Cardiovascular system: irregularly irregular. No rubs or gallops  Gastrointestinal system: Abd is soft, NT, obese & hypoactive bowel sounds  Central nervous system: alert and oriented. Moves all extremities  Psychiatry: judgement and insight appear normal. Appropriate mood  and affect    Data Reviewed: I have personally reviewed following labs and imaging studies  CBC: Recent Labs  Lab 06/21/21 0920 06/23/21 0425  WBC 19.7* 19.1*  NEUTROABS 17.4*  --   HGB 12.5* 11.8*  HCT 38.1* 34.6*  MCV 95.7 92.8  PLT 179 580   Basic Metabolic Panel: Recent Labs  Lab 06/21/21 0920 06/22/21 0321 06/23/21 0425  NA 138 139 135  K 4.8 4.1 4.3  CL 109 109 102  CO2 22 22 24   GLUCOSE 274* 250* 308*  BUN 49* 55* 67*  CREATININE 1.92* 2.10* 2.10*  CALCIUM 9.6 9.3  9.3  MG  --  2.1  --    GFR: Estimated Creatinine Clearance: 22.6 mL/min (A) (by C-G formula based on SCr of 2.1 mg/dL (H)). Liver Function Tests: Recent Labs  Lab 06/21/21 0920  AST 15  ALT 16  ALKPHOS 80  BILITOT 1.7*  PROT 7.2  ALBUMIN 4.0   No results for input(s): "LIPASE", "AMYLASE" in the last 168 hours. No results for input(s): "AMMONIA" in the last 168 hours. Coagulation Profile: Recent Labs  Lab 06/21/21 0920  INR 1.2   Cardiac Enzymes: No results for input(s): "CKTOTAL", "CKMB", "CKMBINDEX", "TROPONINI" in the last 168 hours. BNP (last 3 results) No results for input(s): "PROBNP" in the last 8760 hours. HbA1C: No results for input(s): "HGBA1C" in the last 72 hours. CBG: Recent Labs  Lab 06/21/21 2129 06/22/21 0726 06/22/21 1117 06/22/21 1651 06/22/21 2106  GLUCAP 309* 286* 304* 390* 385*   Lipid Profile: No results for input(s): "CHOL", "HDL", "LDLCALC", "TRIG", "CHOLHDL", "LDLDIRECT" in the last 72 hours. Thyroid Function Tests: No results for input(s): "TSH", "T4TOTAL", "FREET4", "T3FREE", "THYROIDAB" in the last 72 hours. Anemia Panel: No results for input(s): "VITAMINB12", "FOLATE", "FERRITIN", "TIBC", "IRON", "RETICCTPCT" in the last 72 hours. Sepsis Labs: Recent Labs  Lab 06/21/21 0920 06/21/21 1149 06/21/21 1446  PROCALCITON 0.24  --   --   LATICACIDVEN  --  1.4 1.2    Recent Results (from the past 240 hour(s))  Culture, blood (routine x 2)     Status: None (Preliminary result)   Collection Time: 06/21/21  9:20 AM   Specimen: BLOOD  Result Value Ref Range Status   Specimen Description BLOOD BLOOD RIGHT ARM  Final   Special Requests   Final    BOTTLES DRAWN AEROBIC AND ANAEROBIC Blood Culture results may not be optimal due to an excessive volume of blood received in culture bottles   Culture   Final    NO GROWTH 2 DAYS Performed at [facility], [address], [place], [place] [address]    Report Status PENDING   Incomplete  SARS Coronavirus 2 by RT PCR (hospital order, performed in [facility] hospital lab) *cepheid single result test* Anterior Nasal Swab     Status: None   Collection Time: 06/21/21  9:20 AM   Specimen: Anterior Nasal Swab  Result Value Ref Range Status   SARS Coronavirus 2 by RT PCR NEGATIVE NEGATIVE Final    Comment: (NOTE) SARS-CoV-2 target nucleic acids are NOT DETECTED.  The SARS-CoV-2 RNA is generally detectable in upper and lower respiratory specimens during the acute phase of infection. The lowest concentration of SARS-CoV-2 viral copies this assay can detect is 250 copies / mL. A negative result does not preclude SARS-CoV-2 infection and should not be used as the sole basis for treatment or other patient management decisions.  A negative result may occur with improper specimen  collection / handling, submission of specimen other than nasopharyngeal swab, presence of viral mutation(s) within the areas targeted by this assay, and inadequate number of viral copies (<250 copies / mL). A negative result must be combined with clinical observations, patient history, and epidemiological information.  Fact Sheet for Patients:   https://www.patel.info/  Fact Sheet for Healthcare Providers: https://hall.com/  This test is not yet approved or  cleared by the [place] FDA and has been authorized for detection and/or diagnosis of SARS-CoV-2 by FDA under an Emergency Use Authorization (EUA).  This EUA will remain in effect (meaning this test can be used) for the duration of the COVID-19 declaration under Section 564(b)(1) of the Act, 21 U.S.C. section 360bbb-3(b)(1), unless the authorization is terminated or revoked sooner.  Performed at [facility], [address] [place], [place] [address]   Culture, blood (routine x 2)     Status: None (Preliminary result)   Collection Time: 06/21/21  9:28 AM   Specimen: BLOOD   Result Value Ref Range Status   Specimen Description BLOOD RIGHT ANTECUBITAL  Final   Special Requests   Final    BOTTLES DRAWN AEROBIC AND ANAEROBIC Blood Culture adequate volume   Culture   Final    NO GROWTH 2 DAYS Performed at [facility], [address] [place], [place] [address]    Report Status PENDING  Incomplete  Respiratory (~20 pathogens) panel by PCR     Status: None   Collection Time: 06/21/21 12:45 PM   Specimen: Nasopharyngeal Swab; Respiratory  Result Value Ref Range Status   Adenovirus NOT DETECTED NOT DETECTED Final   Coronavirus 229E NOT DETECTED NOT DETECTED Final    Comment: (NOTE) The Coronavirus on the Respiratory Panel, DOES NOT test for the novel  Coronavirus (2019 nCoV)    Coronavirus HKU1 NOT DETECTED NOT DETECTED Final   Coronavirus NL63 NOT DETECTED NOT DETECTED Final   Coronavirus OC43 NOT DETECTED NOT DETECTED Final   Metapneumovirus NOT DETECTED NOT DETECTED Final   Rhinovirus / Enterovirus NOT DETECTED NOT DETECTED Final   Influenza A NOT DETECTED NOT DETECTED Final   Influenza B NOT DETECTED NOT DETECTED Final   Parainfluenza Virus 1 NOT DETECTED NOT DETECTED Final   Parainfluenza Virus 2 NOT DETECTED NOT DETECTED Final   Parainfluenza Virus 3 NOT DETECTED NOT DETECTED Final   Parainfluenza Virus 4 NOT DETECTED NOT DETECTED Final   Respiratory Syncytial Virus NOT DETECTED NOT DETECTED Final   Bordetella pertussis NOT DETECTED NOT DETECTED Final   Bordetella Parapertussis NOT DETECTED NOT DETECTED Final   Chlamydophila pneumoniae NOT DETECTED NOT DETECTED Final   Mycoplasma pneumoniae NOT DETECTED NOT DETECTED Final    Comment: Performed at [facility], [address] [place], [place] [address]  MRSA Next Gen by PCR, Nasal     Status: None   Collection Time: 06/21/21 12:45 PM   Specimen: Nasal Mucosa; Nasal Swab  Result Value Ref Range Status   MRSA by PCR Next Gen NOT DETECTED NOT DETECTED Final    Comment: (NOTE) The  GeneXpert MRSA Assay (FDA approved for NASAL specimens only), is one component of a comprehensive MRSA colonization surveillance program. It is not intended to diagnose MRSA infection nor to guide or monitor treatment for MRSA infections. Test performance is not FDA approved in patients less than 52 years old. Performed at [facility], [address], [place], [place] [address]          Radiology Studies:  ECHOCARDIOGRAM COMPLETE  Result Date: 06/21/2021    ECHOCARDIOGRAM REPORT   Patient Name:   Randy Dalton Date of Exam: 06/21/2021 Medical Rec #:  [id number]     Height:       64.0 in Accession #:    [id number]    Weight:       209.2 lb Date of Birth:  1927/09/11     BSA:          1.994 m Patient Age:    61 years      BP:           110/53 mmHg Patient Gender: M             HR:           63 bpm. Exam Location:  [facility] Procedure: 2D Echo, Color Doppler and Cardiac Doppler Indications:     I50.31 congestive heart failure-Acute Diastolic  History:         Patient has prior history of Echocardiogram examinations. CHF,                  CKD; Risk Factors:Hypertension, Diabetes, HCL and Sleep Apnea.  Sonographer:     Charmayne Sheer Referring Phys:  [id number] Ivor Costa Diagnosing Phys: Ida Rogue MD  Sonographer Comments: Suboptimal apical window. IMPRESSIONS  1. Left ventricular ejection fraction, by estimation, is 60 to 65%. The left ventricle has normal function. The left ventricle has no regional wall motion abnormalities. Left ventricular diastolic parameters are indeterminate.  2. Right ventricular systolic function is normal. The right ventricular size is normal. There is mildly elevated pulmonary artery systolic pressure. The estimated right ventricular systolic pressure is 32.1 mmHg.  3. The mitral valve is normal in structure. Mild mitral valve regurgitation. No evidence of mitral stenosis.  4. Tricuspid valve regurgitation is mild to moderate.  5. The aortic valve is normal in structure. Aortic  valve regurgitation is not visualized. Aortic valve sclerosis/calcification is present, without any evidence of aortic stenosis.  6. Mild pulmonic stenosis.  7. The inferior vena cava is normal in size with greater than 50% respiratory variability, suggesting right atrial pressure of 3 mmHg. FINDINGS  Left Ventricle: Left ventricular ejection fraction, by estimation, is 60 to 65%. The left ventricle has normal function. The left ventricle has no regional wall motion abnormalities. The left ventricular internal cavity size was normal in size. There is  no left ventricular hypertrophy. Left ventricular diastolic parameters are indeterminate. Right Ventricle: The right ventricular size is normal. No increase in right ventricular wall thickness. Right ventricular systolic function is normal. There is mildly elevated pulmonary artery systolic pressure. The tricuspid regurgitant velocity is 3.14  m/s, and with an assumed right atrial pressure of 5 mmHg, the estimated right ventricular systolic pressure is 22.4 mmHg. Left Atrium: Left atrial size was normal in size. Right Atrium: Right atrial size was normal in size. Pericardium: There is no evidence of pericardial effusion. Mitral Valve: The mitral valve is normal in structure. Mild mitral valve regurgitation. No evidence of mitral valve stenosis. MV peak gradient, 5.3 mmHg. The mean mitral valve gradient is 2.0 mmHg. Tricuspid Valve: The tricuspid valve is normal in structure. Tricuspid valve regurgitation is mild to moderate. No evidence of tricuspid stenosis. Aortic Valve: The aortic valve is normal in structure. Aortic valve regurgitation is not visualized. Aortic valve sclerosis/calcification is present, without any evidence of aortic stenosis. Aortic valve mean gradient measures 8.5 mmHg. Aortic valve peak  gradient measures 15.6 mmHg. Aortic  valve area, by VTI measures 1.66 cm. Pulmonic Valve: The pulmonic valve was normal in structure. Pulmonic valve  regurgitation is not visualized. Mild pulmonic stenosis. Aorta: The aortic root is normal in size and structure. Venous: The inferior vena cava is normal in size with greater than 50% respiratory variability, suggesting right atrial pressure of 3 mmHg. IAS/Shunts: No atrial level shunt detected by color flow Doppler.  LEFT VENTRICLE PLAX 2D LVIDd:         4.33 cm   Diastology LVIDs:         2.29 cm   LV e' medial:    9.36 cm/s LV PW:         1.18 cm   LV E/e' medial:  11.0 LV IVS:        0.80 cm   LV e' lateral:   12.20 cm/s LVOT diam:     2.00 cm   LV E/e' lateral: 8.4 LV SV:         64 LV SV Index:   32 LVOT Area:     3.14 cm  RIGHT VENTRICLE RV Basal diam:  3.76 cm LEFT ATRIUM           Index        RIGHT ATRIUM           Index LA diam:      4.50 cm 2.26 cm/m   RA Area:     19.30 cm LA Vol (A2C): 45.5 ml 22.82 ml/m  RA Volume:   57.20 ml  28.68 ml/m LA Vol (A4C): 55.8 ml 27.98 ml/m  AORTIC VALVE                     PULMONIC VALVE AV Area (Vmax):    1.57 cm      PV Vmax:          0.87 m/s AV Area (Vmean):   1.52 cm      PV Vmean:         52.000 cm/s AV Area (VTI):     1.66 cm      PV VTI:           0.151 m AV Vmax:           197.50 cm/s   PV Peak grad:     3.0 mmHg AV Vmean:          138.000 cm/s  PV Mean grad:     1.0 mmHg AV VTI:            0.389 m       PR End Diast Vel: 4.75 msec AV Peak Grad:      15.6 mmHg AV Mean Grad:      8.5 mmHg LVOT Vmax:         98.40 cm/s LVOT Vmean:        66.900 cm/s LVOT VTI:          0.205 m LVOT/AV VTI ratio: 0.53  AORTA Ao Root diam: 3.50 cm MITRAL VALVE                TRICUSPID VALVE MV Area (PHT): 3.53 cm     TR Peak grad:   39.4 mmHg MV Area VTI:   2.52 cm     TR Vmax:        314.00 cm/s MV Peak grad:  5.3 mmHg MV Mean grad:  2.0 mmHg     SHUNTS MV Vmax:  1.15 m/s     Systemic VTI:  0.20 m MV Vmean:      63.3 cm/s    Systemic Diam: 2.00 cm MV Decel Time: 215 msec MV E velocity: 102.70 cm/s Ida Rogue MD Electronically signed by Ida Rogue MD Signature  Date/Time: 06/21/2021/3:09:58 PM    Final    DG Chest Portable 1 View  Result Date: 06/21/2021 CLINICAL DATA:  Shortness of breath. EXAM: PORTABLE CHEST 1 VIEW COMPARISON:  Chest radiographs 01/29/2017 FINDINGS: The patient is rotated to the right. The cardiac silhouette is mildly enlarged. There are symmetric bilateral interstitial opacities which are greater than on the prior study, and there are small bilateral pleural effusions. No pneumothorax is identified. No acute osseous abnormality is seen. IMPRESSION: Cardiomegaly and bilateral interstitial opacities compatible with edema. Small pleural effusions. Electronically Signed   By: Logan Bores M.D.   On: 06/21/2021 09:53        Scheduled Meds:  amLODipine  2.5 mg Oral Daily   azithromycin  500 mg Oral Daily   carvedilol  6.25 mg Oral BID WC   Chlorhexidine Gluconate Cloth  6 each Topical Daily   cholecalciferol  2,000 Units Oral Daily   furosemide  60 mg Intravenous Q12H   insulin aspart  0-5 Units Subcutaneous QHS   insulin aspart  0-9 Units Subcutaneous TID WC   ipratropium-albuterol  3 mL Nebulization Q6H   levothyroxine  175 mcg Oral Q0600   methylPREDNISolone (SOLU-MEDROL) injection  80 mg Intravenous Daily   multivitamin with minerals  1 tablet Oral Daily   mouth rinse  15 mL Mouth Rinse 4 times per day   polyethylene glycol  17 g Oral Daily   simvastatin  20 mg Oral q1800   Continuous Infusions:  cefTRIAXone (ROCEPHIN)  IV Stopped (06/22/21 1124)   heparin 950 Units/hr (06/22/21 1831)     LOS: 2 days    Time spent: 30 mins     Wyvonnia Dusky, MD [facility] Pager [phone]  If 7PM-7AM, please contact night-coverage 06/23/2021, 7:38 AM

## 2021-06-23 NOTE — Evaluation (Signed)
Occupational Therapy Evaluation Patient Details Name: Randy Dalton MRN: [id number] DOB: 07-Apr-1927 Today's Date: 06/23/2021   History of Present Illness Randy Dalton is a 88 y.o. male with medical history significant of hypertension, hyperlipidemia, diabetes mellitus, GERD, hypothyroidism, OSA on CPAP, CKD-4, CHF, presents with worsening shortness of breath.   Clinical Impression   Randy Dalton was seen for OT evaluation this date. Pt was independent in all ADL and functional mobility, living in a 1 story home with 2 STE. Pt endorses use of CPAP for O2 at night, but otherwise denies home O2 use. He reports he is currently waiting on a new CPAP machine from his PCP. Pt reports becoming easily fatigued or out of breath with minimal exertion recently. Pt currently requires MIN A for LB ADL management and SUPERVISION for functional mobility with a RW due to current functional impairments (See OT Problem List below). Pt educated in energy conservation strategies including pursed lip breathing, activity pacing, home/routines modifications, work simplification, AE/DME, and falls prevention. Pt verbalized understanding and would benefit from additional skilled OT services to maximize recall and carryover of learned techniques and facilitate implementation of learned techniques into daily routines. Upon discharge, recommend Randy Dalton services.         Recommendations for follow up therapy are one component of a multi-disciplinary discharge planning process, led by the attending physician.  Recommendations may be updated based on patient status, additional functional criteria and insurance authorization.   Follow Up Recommendations  Home health OT    Assistance Recommended at Discharge Intermittent Supervision/Assistance  Patient can return home with the following      Functional Status Assessment     Equipment Recommendations  BSC/3in1    Recommendations for Other Services       Precautions /  Restrictions Precautions Precautions: Fall Restrictions Weight Bearing Restrictions: No      Mobility Bed Mobility Overal bed mobility: Needs Assistance Bed Mobility: Sit to Supine, Supine to Sit     Supine to sit: Supervision, HOB elevated Sit to supine: Min guard, HOB elevated   General bed mobility comments: Heavy use of bed rails and HOB elevated for bed mobility    Transfers Overall transfer level: Needs assistance Equipment used: Rolling walker (2 wheels) Transfers: Sit to/from Stand Sit to Stand: Min guard, Supervision           General transfer comment: MIN GUARD progressing to supervision for standing/side stepping at EOB.      Balance Overall balance assessment: Needs assistance Sitting-balance support: No upper extremity supported, Feet supported Sitting balance-Leahy Scale: Good Sitting balance - Comments: steady static sitting, min posterior lean with weight shift during functional task, requries cueing to self correct.   Standing balance support: Reliant on assistive device for balance, During functional activity, Bilateral upper extremity supported Standing balance-Leahy Scale: Fair Standing balance comment: heavy reliance on AE for standing balance. Initial posterior lean when coming to standing. improves during session.                           ADL either performed or assessed with clinical judgement   ADL Overall ADL's : Needs assistance/impaired                                       General ADL Comments: Pt is functionally limited by cardiopulmonary status, decreased activity tolerance, and decreased  safety awareness. He requires SUPERVISION for bed mobility, MIN GUARD with cueing for technique for STS at EOB, MIN A for LB dressing during session. Easily fatigued with minimial exertion. Remains on 4L [place] t/o session.     Vision Patient Visual Report: No change from baseline       Perception     Praxis       Pertinent Vitals/Pain Pain Assessment Pain Assessment: No/denies pain     Hand Dominance     Extremity/Trunk Assessment Upper Extremity Assessment Upper Extremity Assessment: Generalized weakness   Lower Extremity Assessment Lower Extremity Assessment: Generalized weakness   Cervical / Trunk Assessment Cervical / Trunk Assessment: Normal   Communication Communication Communication: No difficulties   Cognition Arousal/Alertness: Awake/alert Behavior During Therapy: WFL for tasks assessed/performed Overall Cognitive Status: Within Functional Limits for tasks assessed                                 General Comments: Pleasant, talkative t/o session.     General Comments  Pt remains on 4L [place] t/o session. Pt denies home O2 use. SpO2 noted to drop to 88% with exertion during session. Improves with education on PLB and therapeutic rest breaks. Pt requires consistent cueing for PLB t/o session to maintain O2 saturation WNL.    Exercises Other Exercises Other Exercises: Pt/family educated on role of OT in acute setting, safe use of AE/DME for ADL management, energy conservation strategies including activity pacing, pursed lipped breathing, and routines modifications to support safety and functional independence during ADL management, and DC recs.   Shoulder Instructions      Home Living Family/patient expects to be discharged to:: Private residence Living Arrangements: Spouse/significant other;Children (Daughter) Available Help at Discharge: Family;Available 24 hours/day Type of Home: House Home Access: Stairs to enter [employer] of Steps: 2 Entrance Stairs-Rails: Right Home Layout: One level;Laundry or work area in basement (pt does not access basement on a regular basis.)     Bathroom Shower/Tub: Occupational psychologist: Standard     Home Equipment: Conservation officer, nature (2 wheels);Cane - single point   Additional Comments: Has multiple  walking sticks/canes      Prior Functioning/Environment Prior Level of Function : Independent/Modified Independent;Driving             Mobility Comments: Pt reports primarily using a SPC for functional mobility, particularly community distances. per family does not use it regularly. ADLs Comments: Generally independent for BADL management, endorses driving, denies falls history in last year.        OT Problem List: Decreased strength;Decreased coordination;Decreased activity tolerance;Decreased safety awareness;Impaired balance (sitting and/or standing);Decreased knowledge of use of DME or AE;Cardiopulmonary status limiting activity;Decreased range of motion;Increased edema      OT Treatment/Interventions: Self-care/ADL training;Therapeutic exercise;Therapeutic activities;DME and/or AE instruction;Patient/family education;Balance training;Energy conservation    OT Goals(Current goals can be found in the care plan section) Acute Rehab OT Goals Patient Stated Goal: To go home OT Goal Formulation: With patient/family Time For Goal Achievement: 07/07/21 Potential to Achieve Goals: Good ADL Goals Pt Will Perform Grooming: with modified independence;sitting;with adaptive equipment (c LRAD PRN and min cueing for implementation of learned energy conservation strategies.) Pt Will Transfer to Toilet: with modified independence;ambulating;bedside commode (c LRAD PRN and min cueing for implementation of learned energy conservation strategies.) Pt Will Perform Toileting - Clothing Manipulation and hygiene: sit to/from stand;with modified independence;with adaptive equipment (c LRAD PRN and  min cueing for implementation of learned energy conservation strategies.)  OT Frequency: Min 2X/week    Co-evaluation              AM-PAC OT "6 Clicks" Daily Activity     Outcome Measure Help from another person eating meals?: A Little Help from another person taking care of personal grooming?: A  Little Help from another person toileting, which includes using toliet, bedpan, or urinal?: A Little Help from another person bathing (including washing, rinsing, drying)?: A Little Help from another person to put on and taking off regular upper body clothing?: A Little Help from another person to put on and taking off regular lower body clothing?: A Little 6 Click Score: 18   End of Session Equipment Utilized During Treatment: Rolling walker (2 wheels) Nurse Communication: Mobility status  Activity Tolerance: Patient tolerated treatment well Patient left: in bed;with call bell/phone within reach;with bed alarm set;with family/visitor present  OT Visit Diagnosis: Other abnormalities of gait and mobility (R26.89);Muscle weakness (generalized) (M62.81)                Time: 1005-1036 OT Time Calculation (min): 31 min Charges:  OT General Charges $OT Visit: 1 Visit OT Evaluation $OT Eval Moderate Complexity: 1 Mod OT Treatments $Self Care/Home Management : 8-22 mins  Shara Blazing, M.S., OTR/L Ascom: [phone] 06/23/21, 11:35 AM

## 2021-06-23 NOTE — Evaluation (Signed)
Physical Therapy Evaluation Patient Details Name: Randy Dalton MRN: [id number] DOB: 1927/10/28 Today's Date: 06/23/2021  History of Present Illness  Randy Dalton is a 88 y.o. male with medical history significant of hypertension, hyperlipidemia, diabetes mellitus, GERD, hypothyroidism, OSA on CPAP, CKD-4, CHF, presents with worsening shortness of breath.    Clinical Impression  Pt received in Semi-Fowler's position and agreeable to therapy.  Pt performed well with all transfers and is able to ambulate with wife's cane well.  Pt does have some instability during ambulation and a walker was suggested, however pt reports he does not like them.  Pt has forward flexed posture during ambulation that might be resolved with a walker.  Would prefer to use a walker at some point during hospital stay if possible.  Pt transferred back to seated at EOB with nursing coming in to assess vitals.  All needs met with nursing in room upon leaving.  Due to instability at times during walking, current discharge plans to home with HHPT are appropriate at this time.  Pt will continue to benefit from skilled therapy in order to address deficits listed below.       Recommendations for follow up therapy are one component of a multi-disciplinary discharge planning process, led by the attending physician.  Recommendations may be updated based on patient status, additional functional criteria and insurance authorization.  Follow Up Recommendations Home health PT    Assistance Recommended at Discharge PRN  Patient can return home with the following       Equipment Recommendations Other (comment) (woudl recommend using a walker, but pt does not like them.)  Recommendations for Other Services       Functional Status Assessment Patient has had a recent decline in their functional status and demonstrates the ability to make significant improvements in function in a reasonable and predictable amount of time.      Precautions / Restrictions Precautions Precautions: Fall Restrictions Weight Bearing Restrictions: No      Mobility  Bed Mobility Overal bed mobility: Needs Assistance Bed Mobility: Sit to Supine, Supine to Sit     Supine to sit: Supervision, HOB elevated Sit to supine: Min guard, HOB elevated   General bed mobility comments: Heavy use of bed rails and HOB elevated for bed mobility    Transfers Overall transfer level: Needs assistance Equipment used: Rolling walker (2 wheels) Transfers: Sit to/from Stand Sit to Stand: Min guard, Supervision           General transfer comment: MIN GUARD progressing to supervision for standing/side stepping at EOB.    Ambulation/Gait Ambulation/Gait assistance: Supervision Gait Distance (Feet): 160 Feet Assistive device: Straight cane Gait Pattern/deviations: WFL(Within Functional Limits), Step-through pattern Gait velocity: decreased     General Gait Details: pt with good technique in walking, slight veering at times, but safe.  Stairs            Wheelchair Mobility    Modified Rankin (Stroke Patients Only)       Balance Overall balance assessment: Needs assistance Sitting-balance support: No upper extremity supported, Feet supported Sitting balance-Leahy Scale: Good Sitting balance - Comments: steady static sitting, min posterior lean with weight shift during functional task, requries cueing to self correct.   Standing balance support: Reliant on assistive device for balance, During functional activity, Bilateral upper extremity supported Standing balance-Leahy Scale: Fair Standing balance comment: pt relies on cane for support.  Pertinent Vitals/Pain Pain Assessment Pain Assessment: No/denies pain    Home Living Family/patient expects to be discharged to:: Private residence Living Arrangements: Spouse/significant other;Children (Daughter) Available Help at Discharge:  Family;Available 24 hours/day Type of Home: House Home Access: Stairs to enter Entrance Stairs-Rails: Right Entrance Stairs-Number of Steps: 2   Home Layout: One level;Laundry or work area in basement (pt does not access basement on a regular basis.) Home Equipment: Conservation officer, nature (2 wheels);Cane - single point Additional Comments: Has multiple walking sticks/canes    Prior Function Prior Level of Function : Independent/Modified Independent;Driving             Mobility Comments: Pt reports primarily using a SPC for functional mobility, particularly community distances. per family does not use it regularly. ADLs Comments: Generally independent for BADL management, endorses driving, denies falls history in last year.     Hand Dominance        Extremity/Trunk Assessment   Upper Extremity Assessment Upper Extremity Assessment: Generalized weakness    Lower Extremity Assessment Lower Extremity Assessment: Generalized weakness    Cervical / Trunk Assessment Cervical / Trunk Assessment: Normal  Communication   Communication: No difficulties  Cognition Arousal/Alertness: Awake/alert Behavior During Therapy: WFL for tasks assessed/performed Overall Cognitive Status: Within Functional Limits for tasks assessed                                 General Comments: Pleasant, talkative t/o session.        General Comments      Exercises     Assessment/Plan    PT Assessment Patient needs continued PT services  PT Problem List Decreased strength;Decreased activity tolerance;Decreased balance;Decreased mobility       PT Treatment Interventions DME instruction;Gait training;Stair training;Functional mobility training;Therapeutic activities;Therapeutic exercise;Balance training    PT Goals (Current goals can be found in the Care Plan section)  Acute Rehab PT Goals Patient Stated Goal: to go home. PT Goal Formulation: With patient Time For Goal Achievement:  07/07/21 Potential to Achieve Goals: Good    Frequency Min 2X/week     Co-evaluation               AM-PAC PT "6 Clicks" Mobility  Outcome Measure Help needed turning from your back to your side while in a flat bed without using bedrails?: None Help needed moving from lying on your back to sitting on the side of a flat bed without using bedrails?: None Help needed moving to and from a bed to a chair (including a wheelchair)?: None Help needed standing up from a chair using your arms (e.g., wheelchair or bedside chair)?: A Little Help needed to walk in hospital room?: A Little Help needed climbing 3-5 steps with a railing? : A Little 6 Click Score: 21    End of Session Equipment Utilized During Treatment: Gait belt Activity Tolerance: Patient tolerated treatment well Patient left: in bed;with call bell/phone within reach;with nursing/sitter in room Nurse Communication: Mobility status PT Visit Diagnosis: Unsteadiness on feet (R26.81);Other abnormalities of gait and mobility (R26.89);Muscle weakness (generalized) (M62.81);Difficulty in walking, not elsewhere classified (R26.2)    Time: 1829-9371 PT Time Calculation (min) (ACUTE ONLY): 29 min   Charges:   PT Evaluation $PT Eval Low Complexity: 1 Low PT Treatments $Gait Training: 8-22 mins        Gwenlyn Saran, PT, DPT 06/23/21, 5:04 PM   Christie Nottingham 06/23/2021, 5:00 PM

## 2021-06-24 DIAGNOSIS — E669 Obesity, unspecified: Secondary | ICD-10-CM | POA: Diagnosis not present

## 2021-06-24 DIAGNOSIS — I5033 Acute on chronic diastolic (congestive) heart failure: Secondary | ICD-10-CM | POA: Diagnosis not present

## 2021-06-24 DIAGNOSIS — J9601 Acute respiratory failure with hypoxia: Secondary | ICD-10-CM | POA: Diagnosis not present

## 2021-06-24 DIAGNOSIS — I4819 Other persistent atrial fibrillation: Secondary | ICD-10-CM

## 2021-06-24 LAB — CBC
HCT: 35 % — ABNORMAL LOW (ref 39.0–52.0)
Hemoglobin: 11.9 g/dL — ABNORMAL LOW (ref 13.0–17.0)
MCH: 31.1 pg (ref 26.0–34.0)
MCHC: 34 g/dL (ref 30.0–36.0)
MCV: 91.4 fL (ref 80.0–100.0)
Platelets: 181 10*3/uL (ref 150–400)
RBC: 3.83 MIL/uL — ABNORMAL LOW (ref 4.22–5.81)
RDW: 13.7 % (ref 11.5–15.5)
WBC: 15.4 10*3/uL — ABNORMAL HIGH (ref 4.0–10.5)
nRBC: 0 % (ref 0.0–0.2)

## 2021-06-24 LAB — BASIC METABOLIC PANEL
Anion gap: 9 (ref 5–15)
BUN: 75 mg/dL — ABNORMAL HIGH (ref 8–23)
CO2: 25 mmol/L (ref 22–32)
Calcium: 9.3 mg/dL (ref 8.9–10.3)
Chloride: 101 mmol/L (ref 98–111)
Creatinine, Ser: 2.01 mg/dL — ABNORMAL HIGH (ref 0.61–1.24)
GFR, Estimated: 31 mL/min — ABNORMAL LOW (ref >60)
Glucose, Bld: 328 mg/dL — ABNORMAL HIGH (ref 70–99)
Potassium: 4.2 mmol/L (ref 3.5–5.1)
Sodium: 135 mmol/L (ref 135–145)

## 2021-06-24 LAB — GLUCOSE, CAPILLARY
Glucose-Capillary: 345 mg/dL — ABNORMAL HIGH (ref 70–99)
Glucose-Capillary: 396 mg/dL — ABNORMAL HIGH (ref 70–99)
Glucose-Capillary: 424 mg/dL — ABNORMAL HIGH (ref 70–99)
Glucose-Capillary: 420 mg/dL — ABNORMAL HIGH (ref 70–99)

## 2021-06-24 LAB — GLUCOSE, RANDOM
Glucose, Bld: 425 mg/dL — ABNORMAL HIGH (ref 70–99)
Glucose, Bld: 424 mg/dL — ABNORMAL HIGH (ref 70–99)

## 2021-06-24 MED ORDER — HYDRALAZINE HCL 25 MG PO TABS
25.0000 mg | ORAL_TABLET | Freq: Three times a day (TID) | ORAL | Status: DC
Start: 2021-06-24 — End: 2021-06-25
  Administered 2021-06-24 – 2021-06-25 (×3): 25 mg via ORAL
  Filled 2021-06-24 (×4): qty 1

## 2021-06-24 MED ORDER — FUROSEMIDE 10 MG/ML IJ SOLN
INTRAMUSCULAR | Status: AC
  Administered 2021-06-24: 20 mg
  Filled 2021-06-24: qty 2

## 2021-06-24 MED ORDER — IPRATROPIUM-ALBUTEROL 0.5-2.5 (3) MG/3ML IN SOLN: 3.0000 mL | Freq: Two times a day (BID) | RESPIRATORY_TRACT | Status: DC

## 2021-06-24 MED ORDER — IPRATROPIUM-ALBUTEROL 0.5-2.5 (3) MG/3ML IN SOLN: 3.0000 mL | Freq: Four times a day (QID) | RESPIRATORY_TRACT | Status: DC | PRN

## 2021-06-24 MED ORDER — INSULIN ASPART 100 UNIT/ML IJ SOLN
0.0000 [IU] | Freq: Three times a day (TID) | INTRAMUSCULAR | Status: DC
  Administered 2021-06-24: 15 [IU] via SUBCUTANEOUS
  Administered 2021-06-25 (×2): 8 [IU] via SUBCUTANEOUS
  Administered 2021-06-25 – 2021-06-26 (×2): 11 [IU] via SUBCUTANEOUS
  Filled 2021-06-24 (×4): qty 1

## 2021-06-24 MED ORDER — METHYLPREDNISOLONE SODIUM SUCC 40 MG IJ SOLR
40.0000 mg | Freq: Every day | INTRAMUSCULAR | Status: DC
  Administered 2021-06-25: 40 mg via INTRAVENOUS
  Filled 2021-06-24: qty 1

## 2021-06-24 MED ORDER — INSULIN ASPART 100 UNIT/ML IJ SOLN
1.0000 [IU] | Freq: Once | INTRAMUSCULAR | Status: AC
Start: 2021-06-24 — End: 2021-06-24
  Administered 2021-06-24: 1 [IU] via SUBCUTANEOUS
  Filled 2021-06-24: qty 1

## 2021-06-24 MED ORDER — FUROSEMIDE 10 MG/ML IJ SOLN
INTRAMUSCULAR | Status: AC
  Administered 2021-06-24: 40 mg
  Filled 2021-06-24: qty 4

## 2021-06-24 NOTE — Plan of Care (Signed)
  Problem: Education: Goal: Ability to describe self-care measures that may prevent or decrease complications (Diabetes Survival Skills Education) will improve Outcome: Progressing Goal: Individualized Educational Video(s) Outcome: Progressing   Problem: Coping: Goal: Ability to adjust to condition or change in health will improve Outcome: Progressing   Problem: Fluid Volume: Goal: Ability to maintain a balanced intake and output will improve Outcome: Progressing   Problem: Health Behavior/Discharge Planning: Goal: Ability to identify and utilize available resources and services will improve Outcome: Progressing Goal: Ability to manage health-related needs will improve Outcome: Progressing   Problem: Metabolic: Goal: Ability to maintain appropriate glucose levels will improve Outcome: Progressing   Problem: Nutritional: Goal: Maintenance of adequate nutrition will improve Outcome: Progressing Goal: Progress toward achieving an optimal weight will improve Outcome: Progressing   Problem: Skin Integrity: Goal: Risk for impaired skin integrity will decrease Outcome: Progressing   Problem: Tissue Perfusion: Goal: Adequacy of tissue perfusion will improve Outcome: Progressing   Problem: Education: Goal: Knowledge of General Education information will improve Description: Including pain rating scale, medication(s)/side effects and non-pharmacologic comfort measures Outcome: Progressing   Problem: Health Behavior/Discharge Planning: Goal: Ability to manage health-related needs will improve Outcome: Progressing   Problem: Clinical Measurements: Goal: Ability to maintain clinical measurements within normal limits will improve Outcome: Progressing Goal: Will remain free from infection Outcome: Progressing Goal: Diagnostic test results will improve Outcome: Progressing Goal: Respiratory complications will improve Outcome: Progressing Goal: Cardiovascular complication will  be avoided Outcome: Progressing   Problem: Activity: Goal: Risk for activity intolerance will decrease Outcome: Progressing   Problem: Nutrition: Goal: Adequate nutrition will be maintained Outcome: Progressing   Problem: Coping: Goal: Level of anxiety will decrease Outcome: Progressing   Problem: Elimination: Goal: Will not experience complications related to bowel motility Outcome: Progressing Goal: Will not experience complications related to urinary retention Outcome: Progressing   Problem: Pain Managment: Goal: General experience of comfort will improve Outcome: Progressing   Problem: Safety: Goal: Ability to remain free from injury will improve Outcome: Progressing   Problem: Skin Integrity: Goal: Risk for impaired skin integrity will decrease Outcome: Progressing   Problem: Education: Goal: Ability to demonstrate management of disease process will improve Outcome: Progressing Goal: Ability to verbalize understanding of medication therapies will improve Outcome: Progressing Goal: Individualized Educational Video(s) Outcome: Progressing   Problem: Activity: Goal: Capacity to carry out activities will improve Outcome: Progressing   Problem: Cardiac: Goal: Ability to achieve and maintain adequate cardiopulmonary perfusion will improve Outcome: Progressing   Problem: Activity: Goal: Ability to tolerate increased activity will improve Outcome: Progressing   Problem: Clinical Measurements: Goal: Ability to maintain a body temperature in the normal range will improve Outcome: Progressing   Problem: Respiratory: Goal: Ability to maintain adequate ventilation will improve Outcome: Progressing Goal: Ability to maintain a clear airway will improve Outcome: Progressing   

## 2021-06-24 NOTE — Progress Notes (Signed)
SATURATION QUALIFICATIONS: (This note is used to comply with regulatory documentation for home oxygen)  Patient Saturations on Room Air at Rest = 96%  Patient Saturations on Room Air while Ambulating = 88%  Patient Saturations on 2 Liters of oxygen while Ambulating = 97%  Please briefly explain why patient needs home oxygen: SOB with exertion

## 2021-06-24 NOTE — TOC Initial Note (Signed)
Transition of Care [facility]) - Initial/Assessment Note    Patient Details  Name: Randy Dalton MRN: [id number] Date of Birth: 03/06/1927  Transition of Care [facility]) CM/SW Contact:    Harriet Masson, RN Phone Number:[phone] 06/24/2021, 1:01 PM  Clinical Narrative:                 Discuss recommendations for HHPT/OT. Spoke with pt at bedside along with spouse and daughter. Pt has agreed to [facility] with any agency. Referral made to [facility] Cornea). Pt lives with his spouse in a home with his daughter available to assist at anytime. Reports pt has a RW and cane that he uses for ambulation and declined the 3-1 commode recommended.   TOC will remain available for any ongoing discharge needs.  Expected Discharge Plan: [place] Barriers to Discharge: Barriers Resolved   Patient Goals and CMS Choice        Expected Discharge Plan and Services Expected Discharge Plan: [place]   Discharge Planning Services: CM Consult   Living arrangements for the past 2 months: Single Family Home                           HH Arranged: PT, OT [place] Agency: [place] [facility]) Date HH Agency Contacted: 06/24/21 Time [place]: 41 Representative spoke with at [place]: Corene Cornea  Prior Living Arrangements/Services Living arrangements for the past 2 months: [place] with:: Spouse Patient language and need for interpreter reviewed:: Yes Do you feel safe going back to the place where you live?: Yes      Need for Family Participation in Patient Care: Yes (Comment) Care giver support system in place?: Yes (comment)   Criminal Activity/Legal Involvement Pertinent to Current Situation/Hospitalization: No - Comment as needed  Activities of Daily Living Home Assistive Devices/Equipment: Dentures (specify type), Cane (specify quad or straight), Eyeglasses ADL Screening (condition at time of admission) Patient's cognitive  ability adequate to safely complete daily activities?: Yes Is the patient deaf or have difficulty hearing?: No Does the patient have difficulty seeing, even when wearing glasses/contacts?: No Does the patient have difficulty concentrating, remembering, or making decisions?: No Patient able to express need for assistance with ADLs?: Yes Does the patient have difficulty dressing or bathing?: No Independently performs ADLs?: Yes (appropriate for developmental age) Does the patient have difficulty walking or climbing stairs?: No Weakness of Legs: None Weakness of Arms/Hands: None  Permission Sought/Granted   Permission granted to share information with : Yes, Verbal Permission Granted              Emotional Assessment Appearance:: Appears stated age Attitude/Demeanor/Rapport: Engaged Affect (typically observed): Accepting Orientation: : Oriented to Self, Oriented to Place, Oriented to  Time, Oriented to Situation Alcohol / Substance Use: Not Applicable Psych Involvement: No (comment)  Admission diagnosis:  Bronchospasm [J98.01] Acute on chronic diastolic congestive heart failure (HCC) [I50.33] Acute respiratory failure with hypoxia (HCC) [J96.01] Acute on chronic diastolic CHF (congestive heart failure) (HCC) [I50.33] Patient Active Problem List   Diagnosis Date Noted   Acute on chronic diastolic CHF (congestive heart failure) ([place]) 06/21/2021   Sepsis ([place]) 06/21/2021   Acute bronchitis 06/21/2021   New onset atrial fibrillation ([place]) 06/21/2021   Obstructive sleep apnea    Hypothermia    Acute respiratory failure with hypoxia (HCC)    Elevated troponin    Obesity with body mass index (BMI)  of 30.0 to 39.9    CKD (chronic kidney disease), stage IV (HCC)    Type II diabetes mellitus with renal manifestations ([place])    Benign hypertensive kidney disease with chronic kidney disease 08/23/2020   Chronic kidney disease, stage 4 (severe) ([place]) 07/14/2019   Proteinuria 12/02/2018    Chronic diastolic heart failure ([place]) 02/07/2017   Atherosclerosis of renal artery ([place]) 05/17/2014   ED (erectile dysfunction) of organic origin 05/17/2014   Essential (primary) hypertension 05/17/2014   HLD (hyperlipidemia) 05/17/2014   Adult hypothyroidism 05/17/2014   Adiposity 05/17/2014   Arthritis, degenerative 05/17/2014   Apnea, sleep 05/17/2014   Diabetes mellitus, type 2 ([place]) 05/17/2014   Avitaminosis D 05/17/2014   PCP:  Jerrol Banana., MD Pharmacy:   [id number] - [address], [place] AT [facility] OF [address] & [address] [place] [address] Phone: [phone] Fax: [phone]     Social Determinants of Health (SDOH) Interventions    Readmission Risk Interventions     No data to display

## 2021-06-24 NOTE — Progress Notes (Signed)
CBG 424 this afternoon, stat lab verification ordered. 15 units given per sliding scale and as Dr. Jimmye Norman advised this nurse.

## 2021-06-24 NOTE — Progress Notes (Signed)
PROGRESS NOTE    Randy Dalton  JIR:[id number] DOB: [birth year] DOA: 06/21/2021 PCP: Jerrol Banana., MD    Assessment & Plan:   Principal Problem:   Acute respiratory failure with hypoxia [facility]) Active Problems:   Acute on chronic diastolic CHF (congestive heart failure) (HCC)   Acute bronchitis   Sepsis ([place])   Essential (primary) hypertension   HLD (hyperlipidemia)   Adult hypothyroidism   Obstructive sleep apnea   Hypothermia   Elevated troponin   Obesity with body mass index (BMI) of 30.0 to 39.9   CKD (chronic kidney disease), stage IV (HCC)   Type II diabetes mellitus with renal manifestations ([place])   New onset atrial fibrillation (HCC)  Assessment and Plan: Acute hypoxic respiratory failure: likely secondary to CHF exacerbation. Weaned off of BiPAP. Continue on supplemental oxygen and wean as tolerated, currently on 2L [place]. Continue on IV lasix. Neg approx 2.6L    Acute on chronic diastolic CHF: echo in 7510 showed EF 70% with grade 1 diastolic dysfunction. Echo shows EF 25-85%, diastolic parameters are indeterminate, mild MR, mild-mod TR, & no atrial level shunt detected. Continue on IV lasix. Monitor I/Os and daily weights  Acute bronchitis: continue on IV rocephin, azithromycin, bronchodilators & start steroid taper. Encourage incentive spirometry   Sepsis: met criteria w/ leukocytosis, hypothermia, tachypnea & likely acute bronchitis. Continue on IV abxs. Sepsis resolved  HTN: continue on coreg, amlodipine, hydralazine. Hold losartan    HLD: continue on statin    Hypothyroidism: continue on home dose of levothyroxine   Obstructive sleep apnea: CPAP qhs    Hypothermia: resolved   Elevated troponin: likely secondary to demand ischemia   Obesity: BMI 33.7. Complicates overall care & prognosis    CKDIV: baseline Cr 1.8-2.0. Cr is trending down slightly from day prior   DM2: poorly controlled, HbA1c 8.0. Continue on SSI w/ accuchecks     A.fib: new  onset. CHA2DS2-VASc Score is 5. Continue on coreg, eliquis         DVT prophylaxis: eliquis  Code Status: DNR Family Communication:  Disposition Plan: likely d/c back home   Level of care: Progressive  Status is: Inpatient Remains inpatient appropriate because: severity of illness    Consultants:  Cardio   Procedures:   Antimicrobials: rocephin, azithromycin    Subjective: Pt c/o fatigue   Objective: Vitals:   06/24/21 0522 06/24/21 0805 06/24/21 0806 06/24/21 1209  BP: (!) 150/79 (!) 161/80  131/71  Pulse: 66 69  (!) 56  Resp: 14 20  20   Temp: 97.8 F (36.6 C) 97.7 F (36.5 C)  97.6 F (36.4 C)  TempSrc: Oral Oral  Oral  SpO2: 98% 97% 95% 98%  Weight: 89.2 kg     Height:        Intake/Output Summary (Last 24 hours) at 06/24/2021 1337 Last data filed at 06/24/2021 1100 Gross per 24 hour  Intake --  Output 2600 ml  Net -2600 ml   Filed Weights   06/21/21 1145 06/23/21 0529 06/24/21 0522  Weight: 94.9 kg 89.4 kg 89.2 kg    Examination:  General exam: Appears calm & comfortable   Respiratory system: diminished breath sounds b/l  Cardiovascular system: irregularly irregular Gastrointestinal system: Abd is soft, NT, obese & hypoactive bowel sounds  Central nervous system: Alert and oriented. Moves all extremities  Psychiatry: judgement and insight appear normal. Appropriate mood and affect     Data Reviewed: I have personally reviewed following labs and imaging studies  CBC: Recent Labs  Lab 06/21/21 0920 06/23/21 0425 06/24/21 0427  WBC 19.7* 19.1* 15.4*  NEUTROABS 17.4*  --   --   HGB 12.5* 11.8* 11.9*  HCT 38.1* 34.6* 35.0*  MCV 95.7 92.8 91.4  PLT 179 177 569   Basic Metabolic Panel: Recent Labs  Lab 06/21/21 0920 06/22/21 0321 06/23/21 0425 06/24/21 0427  NA 138 139 135 135  K 4.8 4.1 4.3 4.2  CL 109 109 102 101  CO2 22 22 24 25   GLUCOSE 274* 250* 308* 328*  BUN 49* 55* 67* 75*  CREATININE 1.92* 2.10* 2.10* 2.01*   CALCIUM 9.6 9.3 9.3 9.3  MG  --  2.1  --   --    GFR: Estimated Creatinine Clearance: 23.6 mL/min (A) (by C-G formula based on SCr of 2.01 mg/dL (H)). Liver Function Tests: Recent Labs  Lab 06/21/21 0920  AST 15  ALT 16  ALKPHOS 80  BILITOT 1.7*  PROT 7.2  ALBUMIN 4.0   No results for input(s): "LIPASE", "AMYLASE" in the last 168 hours. No results for input(s): "AMMONIA" in the last 168 hours. Coagulation Profile: Recent Labs  Lab 06/21/21 0920  INR 1.2   Cardiac Enzymes: No results for input(s): "CKTOTAL", "CKMB", "CKMBINDEX", "TROPONINI" in the last 168 hours. BNP (last 3 results) No results for input(s): "PROBNP" in the last 8760 hours. HbA1C: No results for input(s): "HGBA1C" in the last 72 hours. CBG: Recent Labs  Lab 06/23/21 1133 06/23/21 1642 06/23/21 1958 06/24/21 0802 06/24/21 1208  GLUCAP 273* 373* 377* 345* 396*   Lipid Profile: No results for input(s): "CHOL", "HDL", "LDLCALC", "TRIG", "CHOLHDL", "LDLDIRECT" in the last 72 hours. Thyroid Function Tests: No results for input(s): "TSH", "T4TOTAL", "FREET4", "T3FREE", "THYROIDAB" in the last 72 hours. Anemia Panel: No results for input(s): "VITAMINB12", "FOLATE", "FERRITIN", "TIBC", "IRON", "RETICCTPCT" in the last 72 hours. Sepsis Labs: Recent Labs  Lab 06/21/21 0920 06/21/21 1149 06/21/21 1446  PROCALCITON 0.24  --   --   LATICACIDVEN  --  1.4 1.2    Recent Results (from the past 240 hour(s))  Culture, blood (routine x 2)     Status: None (Preliminary result)   Collection Time: 06/21/21  9:20 AM   Specimen: BLOOD  Result Value Ref Range Status   Specimen Description BLOOD BLOOD RIGHT ARM  Final   Special Requests   Final    BOTTLES DRAWN AEROBIC AND ANAEROBIC Blood Culture results may not be optimal due to an excessive volume of blood received in culture bottles   Culture   Final    NO GROWTH 3 DAYS Performed at [facility], [address] [place], [place] [address]     Report Status PENDING  Incomplete  SARS Coronavirus 2 by RT PCR (hospital order, performed in [facility] hospital lab) *cepheid single result test* Anterior Nasal Swab     Status: None   Collection Time: 06/21/21  9:20 AM   Specimen: Anterior Nasal Swab  Result Value Ref Range Status   SARS Coronavirus 2 by RT PCR NEGATIVE NEGATIVE Final    Comment: (NOTE) SARS-CoV-2 target nucleic acids are NOT DETECTED.  The SARS-CoV-2 RNA is generally detectable in upper and lower respiratory specimens during the acute phase of infection. The lowest concentration of SARS-CoV-2 viral copies this assay can detect is 250 copies / mL. A negative result does not preclude SARS-CoV-2 infection and should not be used as the sole basis for treatment or other patient management decisions.  A negative  result may occur with improper specimen collection / handling, submission of specimen other than nasopharyngeal swab, presence of viral mutation(s) within the areas targeted by this assay, and inadequate number of viral copies (<250 copies / mL). A negative result must be combined with clinical observations, patient history, and epidemiological information.  Fact Sheet for Patients:   https://www.patel.info/  Fact Sheet for Healthcare Providers: https://hall.com/  This test is not yet approved or  cleared by the [place] FDA and has been authorized for detection and/or diagnosis of SARS-CoV-2 by FDA under an Emergency Use Authorization (EUA).  This EUA will remain in effect (meaning this test can be used) for the duration of the COVID-19 declaration under Section 564(b)(1) of the Act, 21 U.S.C. section 360bbb-3(b)(1), unless the authorization is terminated or revoked sooner.  Performed at [facility], [address] [place], [place] [address]   Culture, blood (routine x 2)     Status: None (Preliminary result)   Collection Time: 06/21/21  9:28 AM    Specimen: BLOOD  Result Value Ref Range Status   Specimen Description BLOOD RIGHT ANTECUBITAL  Final   Special Requests   Final    BOTTLES DRAWN AEROBIC AND ANAEROBIC Blood Culture adequate volume   Culture   Final    NO GROWTH 3 DAYS Performed at [facility], [address] [place], [place] [address]    Report Status PENDING  Incomplete  Respiratory (~20 pathogens) panel by PCR     Status: None   Collection Time: 06/21/21 12:45 PM   Specimen: Nasopharyngeal Swab; Respiratory  Result Value Ref Range Status   Adenovirus NOT DETECTED NOT DETECTED Final   Coronavirus 229E NOT DETECTED NOT DETECTED Final    Comment: (NOTE) The Coronavirus on the Respiratory Panel, DOES NOT test for the novel  Coronavirus (2019 nCoV)    Coronavirus HKU1 NOT DETECTED NOT DETECTED Final   Coronavirus NL63 NOT DETECTED NOT DETECTED Final   Coronavirus OC43 NOT DETECTED NOT DETECTED Final   Metapneumovirus NOT DETECTED NOT DETECTED Final   Rhinovirus / Enterovirus NOT DETECTED NOT DETECTED Final   Influenza A NOT DETECTED NOT DETECTED Final   Influenza B NOT DETECTED NOT DETECTED Final   Parainfluenza Virus 1 NOT DETECTED NOT DETECTED Final   Parainfluenza Virus 2 NOT DETECTED NOT DETECTED Final   Parainfluenza Virus 3 NOT DETECTED NOT DETECTED Final   Parainfluenza Virus 4 NOT DETECTED NOT DETECTED Final   Respiratory Syncytial Virus NOT DETECTED NOT DETECTED Final   Bordetella pertussis NOT DETECTED NOT DETECTED Final   Bordetella Parapertussis NOT DETECTED NOT DETECTED Final   Chlamydophila pneumoniae NOT DETECTED NOT DETECTED Final   Mycoplasma pneumoniae NOT DETECTED NOT DETECTED Final    Comment: Performed at [facility], [place]. [address], [place], [place] [address]  MRSA Next Gen by PCR, Nasal     Status: None   Collection Time: 06/21/21 12:45 PM   Specimen: Nasal Mucosa; Nasal Swab  Result Value Ref Range Status   MRSA by PCR Next Gen NOT DETECTED NOT DETECTED Final     Comment: (NOTE) The GeneXpert MRSA Assay (FDA approved for NASAL specimens only), is one component of a comprehensive MRSA colonization surveillance program. It is not intended to diagnose MRSA infection nor to guide or monitor treatment for MRSA infections. Test performance is not FDA approved in patients less than 64 years old. Performed at [facility], [address] [place], [place] [address]  Radiology Studies: No results found.      Scheduled Meds:  amLODipine  5 mg Oral Daily   apixaban  2.5 mg Oral BID   azithromycin  500 mg Oral Daily   carvedilol  6.25 mg Oral BID WC   cholecalciferol  2,000 Units Oral Daily   furosemide  60 mg Intravenous Q12H   hydrALAZINE  25 mg Oral Q8H   insulin aspart  0-5 Units Subcutaneous QHS   insulin aspart  0-9 Units Subcutaneous TID WC   ipratropium-albuterol  3 mL Nebulization BID   levothyroxine  175 mcg Oral Q0600   [START ON 06/25/2021] methylPREDNISolone (SOLU-MEDROL) injection  40 mg Intravenous Daily   multivitamin with minerals  1 tablet Oral Daily   polyethylene glycol  17 g Oral Daily   simvastatin  20 mg Oral q1800   Continuous Infusions:  cefTRIAXone (ROCEPHIN)  IV 2 g (06/24/21 0826)     LOS: 3 days    Time spent: 25 mins     Wyvonnia Dusky, MD [facility] Pager [phone]  If 7PM-7AM, please contact night-coverage 06/24/2021, 1:37 PM

## 2021-06-24 NOTE — Progress Notes (Signed)
Progress Note  Patient Name: Randy Dalton Date of Encounter: 06/24/2021  [facility] Cardiologist: Dionisio David, MD   Subjective   Breathing is much better, edema also much improved.  Net -2.9 L over the past 24 hours.  Inpatient Medications    Scheduled Meds:  amLODipine  5 mg Oral Daily   apixaban  2.5 mg Oral BID   azithromycin  500 mg Oral Daily   carvedilol  6.25 mg Oral BID WC   cholecalciferol  2,000 Units Oral Daily   furosemide  60 mg Intravenous Q12H   hydrALAZINE  25 mg Oral Q8H   insulin aspart  0-5 Units Subcutaneous QHS   insulin aspart  0-9 Units Subcutaneous TID WC   ipratropium-albuterol  3 mL Nebulization TID   levothyroxine  175 mcg Oral Q0600   methylPREDNISolone (SOLU-MEDROL) injection  80 mg Intravenous Daily   multivitamin with minerals  1 tablet Oral Daily   mouth rinse  15 mL Mouth Rinse 4 times per day   polyethylene glycol  17 g Oral Daily   simvastatin  20 mg Oral q1800   Continuous Infusions:  cefTRIAXone (ROCEPHIN)  IV 2 g (06/24/21 0826)   PRN Meds: acetaminophen, albuterol, dextromethorphan-guaiFENesin, hydrALAZINE, ondansetron (ZOFRAN) IV, mouth rinse   Vital Signs    Vitals:   06/24/21 0015 06/24/21 0522 06/24/21 0805 06/24/21 0806  BP: (!) 142/74 (!) 150/79 (!) 161/80   Pulse: 66 66 69   Resp: 18 14 20    Temp: 97.6 F (36.4 C) 97.8 F (36.6 C) 97.7 F (36.5 C)   TempSrc:  Oral Oral   SpO2: 96% 98% 97% 95%  Weight:  89.2 kg    Height:        Intake/Output Summary (Last 24 hours) at 06/24/2021 1114 Last data filed at 06/24/2021 0527 Gross per 24 hour  Intake --  Output 2450 ml  Net -2450 ml      06/24/2021    5:22 AM 06/23/2021    5:29 AM 06/21/2021   11:45 AM  Last 3 Weights  Weight (lbs) 196 lb 10.4 oz 197 lb 1.6 oz 209 lb 3.5 oz  Weight (kg) 89.2 kg 89.404 kg 94.9 kg      Telemetry    Atrial fibrillation, heart rate 73- Personally Reviewed  ECG     - Personally Reviewed  Physical Exam   GEN: No  acute distress.   Neck: No JVD Cardiac: Irregular irregular Respiratory: Diminished breath sounds, no wheezing GI: Soft, nontender, non-distended  MS: Trace edema Neuro:  Nonfocal  Psych: Normal affect   Labs    High Sensitivity Troponin:   Recent Labs  Lab 06/21/21 0920 06/21/21 1149  TROPONINIHS 24* 24*     Chemistry Recent Labs  Lab 06/21/21 0920 06/22/21 0321 06/23/21 0425 06/24/21 0427  NA 138 139 135 135  K 4.8 4.1 4.3 4.2  CL 109 109 102 101  CO2 22 22 24 25   GLUCOSE 274* 250* 308* 328*  BUN 49* 55* 67* 75*  CREATININE 1.92* 2.10* 2.10* 2.01*  CALCIUM 9.6 9.3 9.3 9.3  MG  --  2.1  --   --   PROT 7.2  --   --   --   ALBUMIN 4.0  --   --   --   AST 15  --   --   --   ALT 16  --   --   --   ALKPHOS 80  --   --   --  BILITOT 1.7*  --   --   --   GFRNONAA 32* 29* 29* 31*  ANIONGAP 7 8 9 9     Lipids No results for input(s): "CHOL", "TRIG", "HDL", "LABVLDL", "LDLCALC", "CHOLHDL" in the last 168 hours.  Hematology Recent Labs  Lab 06/21/21 0920 06/23/21 0425 06/24/21 0427  WBC 19.7* 19.1* 15.4*  RBC 3.98* 3.73* 3.83*  HGB 12.5* 11.8* 11.9*  HCT 38.1* 34.6* 35.0*  MCV 95.7 92.8 91.4  MCH 31.4 31.6 31.1  MCHC 32.8 34.1 34.0  RDW 14.0 13.8 13.7  PLT 179 177 181   Thyroid No results for input(s): "TSH", "FREET4" in the last 168 hours.  BNP Recent Labs  Lab 06/21/21 0920  BNP 562.0*    DDimer No results for input(s): "DDIMER" in the last 168 hours.   Radiology    No results found.  Cardiac Studies   TTE 06/21/2021 1. Left ventricular ejection fraction, by estimation, is 60 to 65%. The  left ventricle has normal function. The left ventricle has no regional  wall motion abnormalities. Left ventricular diastolic parameters are  indeterminate.   2. Right ventricular systolic function is normal. The right ventricular  size is normal. There is mildly elevated pulmonary artery systolic  pressure. The estimated right ventricular systolic pressure is  70.6 mmHg.   3. The mitral valve is normal in structure. Mild mitral valve  regurgitation. No evidence of mitral stenosis.   4. Tricuspid valve regurgitation is mild to moderate.   5. The aortic valve is normal in structure. Aortic valve regurgitation is  not visualized. Aortic valve sclerosis/calcification is present, without  any evidence of aortic stenosis.   6. Mild pulmonic stenosis.   7. The inferior vena cava is normal in size with greater than 50%  respiratory variability, suggesting right atrial pressure of 3 mmHg.   Patient Profile     88 y.o. male with history of hypertension, hyperlipidemia, HFpEF, CKD 4 presenting with worsening shortness of breath, being seen for HFpEF and atrial fibrillation.  Assessment & Plan    1.  HFpEF -Edema improving -Net -2.9 L over the past 24 hours. -Continue IV Lasix 60 mg twice daily -Monitor creatinine with diuresing. -Titrate off steroids as tolerable per primary team, this is contributing to both edema and hypertension   2.  Persistent A-fib -Continue rate control strategy -Asymptomatic -Coreg 6.25 mg twice daily, Eliquis 2.5 mg twice daily.     3.  Hypertension -BP elevated -Norvasc to 5 mg daily.  Continue Coreg 6.25 twice daily. -Start hydralazine 25 mg 3 times daily  4.  Respiratory dysfunction, on steroids -recommend Tapering off steroids as clinical function permits   Greater than 50% was spent in counseling and coordination of care with patient Total encounter time 50 minutes     Signed, Kate Sable, MD  06/24/2021, 11:14 AM

## 2021-06-25 DIAGNOSIS — J9601 Acute respiratory failure with hypoxia: Secondary | ICD-10-CM | POA: Diagnosis not present

## 2021-06-25 DIAGNOSIS — I5033 Acute on chronic diastolic (congestive) heart failure: Secondary | ICD-10-CM | POA: Diagnosis not present

## 2021-06-25 DIAGNOSIS — E669 Obesity, unspecified: Secondary | ICD-10-CM | POA: Diagnosis not present

## 2021-06-25 LAB — BASIC METABOLIC PANEL
Anion gap: 11 (ref 5–15)
BUN: 82 mg/dL — ABNORMAL HIGH (ref 8–23)
CO2: 27 mmol/L (ref 22–32)
Calcium: 9.3 mg/dL (ref 8.9–10.3)
Chloride: 98 mmol/L (ref 98–111)
Creatinine, Ser: 2.03 mg/dL — ABNORMAL HIGH (ref 0.61–1.24)
GFR, Estimated: 30 mL/min — ABNORMAL LOW (ref >60)
Glucose, Bld: 280 mg/dL — ABNORMAL HIGH (ref 70–99)
Potassium: 4.3 mmol/L (ref 3.5–5.1)
Sodium: 136 mmol/L (ref 135–145)

## 2021-06-25 LAB — CBC
HCT: 38.1 % — ABNORMAL LOW (ref 39.0–52.0)
Hemoglobin: 13 g/dL (ref 13.0–17.0)
MCH: 31 pg (ref 26.0–34.0)
MCHC: 34.1 g/dL (ref 30.0–36.0)
MCV: 90.9 fL (ref 80.0–100.0)
Platelets: 206 10*3/uL (ref 150–400)
RBC: 4.19 MIL/uL — ABNORMAL LOW (ref 4.22–5.81)
RDW: 13.6 % (ref 11.5–15.5)
WBC: 15.3 10*3/uL — ABNORMAL HIGH (ref 4.0–10.5)
nRBC: 0 % (ref 0.0–0.2)

## 2021-06-25 LAB — GLUCOSE, CAPILLARY
Glucose-Capillary: 269 mg/dL — ABNORMAL HIGH (ref 70–99)
Glucose-Capillary: 304 mg/dL — ABNORMAL HIGH (ref 70–99)
Glucose-Capillary: 272 mg/dL — ABNORMAL HIGH (ref 70–99)
Glucose-Capillary: 296 mg/dL — ABNORMAL HIGH (ref 70–99)

## 2021-06-25 MED ORDER — INSULIN ASPART 100 UNIT/ML IJ SOLN
3.0000 [IU] | Freq: Three times a day (TID) | INTRAMUSCULAR | Status: DC
Start: 2021-06-25 — End: 2021-06-26
  Administered 2021-06-25 – 2021-06-26 (×4): 3 [IU] via SUBCUTANEOUS
  Filled 2021-06-25 (×4): qty 1

## 2021-06-25 MED ORDER — TORSEMIDE 20 MG PO TABS
20.0000 mg | ORAL_TABLET | Freq: Two times a day (BID) | ORAL | Status: DC
  Administered 2021-06-25 – 2021-06-26 (×3): 20 mg via ORAL
  Filled 2021-06-25 (×3): qty 1

## 2021-06-25 MED ORDER — INSULIN DETEMIR 100 UNIT/ML ~~LOC~~ SOLN
9.0000 [IU] | Freq: Every day | SUBCUTANEOUS | Status: DC
  Administered 2021-06-25 – 2021-06-26 (×2): 9 [IU] via SUBCUTANEOUS
  Filled 2021-06-25 (×2): qty 0.09

## 2021-06-25 MED ORDER — PREDNISONE 20 MG PO TABS
20.0000 mg | ORAL_TABLET | Freq: Every day | ORAL | Status: DC
  Administered 2021-06-26: 20 mg via ORAL
  Filled 2021-06-25: qty 1

## 2021-06-25 MED ORDER — HYDRALAZINE HCL 50 MG PO TABS
50.0000 mg | ORAL_TABLET | Freq: Three times a day (TID) | ORAL | Status: DC
  Administered 2021-06-25 – 2021-06-26 (×3): 50 mg via ORAL
  Filled 2021-06-25 (×4): qty 1

## 2021-06-25 NOTE — Progress Notes (Addendum)
SATURATION QUALIFICATIONS: (This note is used to comply with regulatory documentation for home oxygen)  Patient Saturations on Room Air at Rest = 93%  Patient Saturations on Room Air while Ambulating = 89-90%  Patient Saturations on n/a Liters of oxygen while Ambulating = n/a%  Please briefly explain why patient needs home oxygen:

## 2021-06-25 NOTE — Telephone Encounter (Signed)
Requested medication (s) are due for refill today: yes  Requested medication (s) are on the active medication list: {yes  Last refill:  09/15/20 #90 2 RF  Future visit scheduled: no  Notes to clinic:  overdue lab   Requested Prescriptions  Pending Prescriptions Disp Refills   levothyroxine (SYNTHROID) 175 MCG tablet [Pharmacy Med Name: LEVOTHYROXINE 0.175MG  (175MCG) TABS] 90 tablet 2    Sig: TAKE 1 TABLET BY MOUTH EVERY DAY     Endocrinology:  Hypothyroid Agents Failed - 06/23/2021  7:10 AM      Failed - TSH in normal range and within 360 days    TSH  Date Value Ref Range Status  06/13/2020 3.590 0.450 - 4.500 uIU/mL Final         Passed - Valid encounter within last 12 months    Recent Outpatient Visits           4 months ago Primary osteoarthritis involving multiple joints   [facility] Jerrol Banana., MD   4 months ago Type 2 diabetes mellitus with stage 3a chronic kidney disease, without long-term current use of insulin ([place])   [facility] Jerrol Banana., MD   8 months ago Type 2 diabetes mellitus with stage 3a chronic kidney disease, without long-term current use of insulin [facility])   [facility] Jerrol Banana., MD   1 year ago Type 2 diabetes mellitus with stage 3a chronic kidney disease, without long-term current use of insulin [facility])   [facility] Jerrol Banana., MD   1 year ago Type 2 diabetes mellitus with stage 3a chronic kidney disease, without long-term current use of insulin [facility])   [facility] Jerrol Banana., MD

## 2021-06-25 NOTE — Plan of Care (Signed)
  Problem: Education: Goal: Ability to describe self-care measures that may prevent or decrease complications (Diabetes Survival Skills Education) will improve Outcome: Progressing Goal: Individualized Educational Video(s) Outcome: Progressing   Problem: Coping: Goal: Ability to adjust to condition or change in health will improve Outcome: Progressing   Problem: Fluid Volume: Goal: Ability to maintain a balanced intake and output will improve Outcome: Progressing   Problem: Health Behavior/Discharge Planning: Goal: Ability to identify and utilize available resources and services will improve Outcome: Progressing Goal: Ability to manage health-related needs will improve Outcome: Progressing   Problem: Metabolic: Goal: Ability to maintain appropriate glucose levels will improve Outcome: Progressing   Problem: Nutritional: Goal: Maintenance of adequate nutrition will improve Outcome: Progressing Goal: Progress toward achieving an optimal weight will improve Outcome: Progressing   Problem: Skin Integrity: Goal: Risk for impaired skin integrity will decrease Outcome: Progressing   Problem: Tissue Perfusion: Goal: Adequacy of tissue perfusion will improve Outcome: Progressing   Problem: Education: Goal: Knowledge of General Education information will improve Description: Including pain rating scale, medication(s)/side effects and non-pharmacologic comfort measures Outcome: Progressing   Problem: Health Behavior/Discharge Planning: Goal: Ability to manage health-related needs will improve Outcome: Progressing   Problem: Clinical Measurements: Goal: Ability to maintain clinical measurements within normal limits will improve Outcome: Progressing Goal: Will remain free from infection Outcome: Progressing Goal: Diagnostic test results will improve Outcome: Progressing Goal: Respiratory complications will improve Outcome: Progressing Goal: Cardiovascular complication will  be avoided Outcome: Progressing   Problem: Activity: Goal: Risk for activity intolerance will decrease Outcome: Progressing   Problem: Nutrition: Goal: Adequate nutrition will be maintained Outcome: Progressing   Problem: Coping: Goal: Level of anxiety will decrease Outcome: Progressing   Problem: Elimination: Goal: Will not experience complications related to bowel motility Outcome: Progressing Goal: Will not experience complications related to urinary retention Outcome: Progressing   Problem: Pain Managment: Goal: General experience of comfort will improve Outcome: Progressing   Problem: Safety: Goal: Ability to remain free from injury will improve Outcome: Progressing   Problem: Skin Integrity: Goal: Risk for impaired skin integrity will decrease Outcome: Progressing   Problem: Education: Goal: Ability to demonstrate management of disease process will improve Outcome: Progressing Goal: Ability to verbalize understanding of medication therapies will improve Outcome: Progressing Goal: Individualized Educational Video(s) Outcome: Progressing   Problem: Activity: Goal: Capacity to carry out activities will improve Outcome: Progressing   Problem: Cardiac: Goal: Ability to achieve and maintain adequate cardiopulmonary perfusion will improve Outcome: Progressing   Problem: Activity: Goal: Ability to tolerate increased activity will improve Outcome: Progressing   Problem: Clinical Measurements: Goal: Ability to maintain a body temperature in the normal range will improve Outcome: Progressing   Problem: Respiratory: Goal: Ability to maintain adequate ventilation will improve Outcome: Progressing Goal: Ability to maintain a clear airway will improve Outcome: Progressing   

## 2021-06-25 NOTE — Progress Notes (Signed)
PT Cancellation Note  Patient Details Name: Randy Dalton MRN: [id number] DOB: February 14, 1927   Cancelled Treatment:    Reason Eval/Treat Not Completed: Patient declined, no reason specified. Patient politely declined getting OOB or performing bed level exercise. He reports he has already walked today with nursing assistance. PT will continue with attempts.   Minna Merritts, PT, MPT  [name] 06/25/2021, 2:10 PM

## 2021-06-25 NOTE — Inpatient Diabetes Management (Signed)
Inpatient Diabetes Program Recommendations  AACE/ADA: New Consensus Statement on Inpatient Glycemic Control   Target Ranges:  Prepandial:   less than 140 mg/dL      Peak postprandial:   less than 180 mg/dL (1-2 hours)      Critically ill patients:  140 - 180 mg/dL    Latest Reference Range & Units 06/24/21 08:02 06/24/21 12:08 06/24/21 17:05 06/24/21 20:33 06/25/21 07:41  Glucose-Capillary 70 - 99 mg/dL 345 (H) 396 (H) 424 (H) 420 (H) 269 (H)   Review of Glycemic Control  Diabetes history: DM2 Outpatient Diabetes medications: Amaryl 4 mg daily, Tradjenta 5 mg daily Current orders for Inpatient glycemic control: Novolog 0-15 units TID with meals, Novolog 0-5 units QHS; Solumedrol 40 mg daily  Inpatient Diabetes Program Recommendations:    Insulin: Glucose ranged from 345-424 mg/dl on 06/24/21 and fasting glucose 269 mg/dl today. If steroids are continued, please consider ordering Levemir 9 units Q24H and Novolog 3 units TID with meals for meal coverage if patient eats at least 50% of meals.  Thanks, Barnie Alderman, RN, MSN, [place] Diabetes Coordinator Inpatient Diabetes Program [phone] (Team Pager from 8am to [place])

## 2021-06-25 NOTE — Progress Notes (Signed)
Progress Note  Patient Name: Randy Dalton Date of Encounter: 06/25/2021  [facility] Cardiologist: Dionisio David, MD   Subjective   Breathing is much better, edema also much improved.  No chest pain or palpitations. Documented UOP -2.6 L over the past 24 hours, net - 11 L for the admission. Weight trend 89.2 to 87.6 kg over the past 24 hours. BUN trending up, SCr largely stable. CO2 trending up, chloride down trending.   Inpatient Medications    Scheduled Meds:  amLODipine  5 mg Oral Daily   apixaban  2.5 mg Oral BID   azithromycin  500 mg Oral Daily   carvedilol  6.25 mg Oral BID WC   cholecalciferol  2,000 Units Oral Daily   furosemide  60 mg Intravenous Q12H   hydrALAZINE  25 mg Oral Q8H   insulin aspart  0-15 Units Subcutaneous TID WC   insulin aspart  0-5 Units Subcutaneous QHS   levothyroxine  175 mcg Oral Q0600   methylPREDNISolone (SOLU-MEDROL) injection  40 mg Intravenous Daily   multivitamin with minerals  1 tablet Oral Daily   polyethylene glycol  17 g Oral Daily   simvastatin  20 mg Oral q1800   Continuous Infusions:  cefTRIAXone (ROCEPHIN)  IV Stopped (06/24/21 0856)   PRN Meds: acetaminophen, dextromethorphan-guaiFENesin, hydrALAZINE, ipratropium-albuterol, ondansetron (ZOFRAN) IV   Vital Signs    Vitals:   06/24/21 2030 06/24/21 2355 06/25/21 0412 06/25/21 0742  BP: (!) 156/80 139/84 (!) 151/82 (!) 143/91  Pulse: 66 (!) 59 86 (!) 56  Resp: 18 18 15 17   Temp: 98.1 F (36.7 C) 97.8 F (36.6 C) 98 F (36.7 C) 97.8 F (36.6 C)  TempSrc: Oral  Oral   SpO2: 94% 98% 98% 97%  Weight:   87.6 kg   Height:   5\' 4"  (1.626 m)     Intake/Output Summary (Last 24 hours) at 06/25/2021 0746 Last data filed at 06/25/2021 0414 Gross per 24 hour  Intake 200 ml  Output 2850 ml  Net -2650 ml       06/25/2021    4:12 AM 06/24/2021    5:22 AM 06/23/2021    5:29 AM  Last 3 Weights  Weight (lbs) 193 lb 2 oz 196 lb 10.4 oz 197 lb 1.6 oz  Weight (kg) 87.6 kg  89.2 kg 89.404 kg      Telemetry    Not on telemetry - Personally Reviewed  ECG    No new tracings - Personally Reviewed  Physical Exam   GEN: No acute distress.   Neck: No JVD Cardiac: Irregular irregular, I/VI systolic murmur LSB, no rubs or gallops Respiratory: Diminished breath sounds, no wheezing GI: Soft, nontender, non-distended  MS: Trivial pretibial edema bilaterally  Neuro:  Nonfocal  Psych: Normal affect   Labs    High Sensitivity Troponin:   Recent Labs  Lab 06/21/21 0920 06/21/21 1149  TROPONINIHS 24* 24*      Chemistry Recent Labs  Lab 06/21/21 0920 06/22/21 0321 06/23/21 0425 06/24/21 0427 06/24/21 1839 06/24/21 2202 06/25/21 0526  NA 138 139 135 135  --   --  136  K 4.8 4.1 4.3 4.2  --   --  4.3  CL 109 109 102 101  --   --  98  CO2 22 22 24 25   --   --  27  GLUCOSE 274* 250* 308* 328* 425* 424* 280*  BUN 49* 55* 67* 75*  --   --  82*  CREATININE 1.92* 2.10* 2.10* 2.01*  --   --  2.03*  CALCIUM 9.6 9.3 9.3 9.3  --   --  9.3  MG  --  2.1  --   --   --   --   --   PROT 7.2  --   --   --   --   --   --   ALBUMIN 4.0  --   --   --   --   --   --   AST 15  --   --   --   --   --   --   ALT 16  --   --   --   --   --   --   ALKPHOS 80  --   --   --   --   --   --   BILITOT 1.7*  --   --   --   --   --   --   GFRNONAA 32* 29* 29* 31*  --   --  30*  ANIONGAP 7 8 9 9   --   --  11     Lipids No results for input(s): "CHOL", "TRIG", "HDL", "LABVLDL", "LDLCALC", "CHOLHDL" in the last 168 hours.  Hematology Recent Labs  Lab 06/23/21 0425 06/24/21 0427 06/25/21 0526  WBC 19.1* 15.4* 15.3*  RBC 3.73* 3.83* 4.19*  HGB 11.8* 11.9* 13.0  HCT 34.6* 35.0* 38.1*  MCV 92.8 91.4 90.9  MCH 31.6 31.1 31.0  MCHC 34.1 34.0 34.1  RDW 13.8 13.7 13.6  PLT 177 181 206    Thyroid No results for input(s): "TSH", "FREET4" in the last 168 hours.  BNP Recent Labs  Lab 06/21/21 0920  BNP 562.0*     DDimer No results for input(s): "DDIMER" in the last  168 hours.   Radiology    No results found.  Cardiac Studies   2D echo 06/21/2021: 1. Left ventricular ejection fraction, by estimation, is 60 to 65%. The  left ventricle has normal function. The left ventricle has no regional  wall motion abnormalities. Left ventricular diastolic parameters are  indeterminate.   2. Right ventricular systolic function is normal. The right ventricular  size is normal. There is mildly elevated pulmonary artery systolic  pressure. The estimated right ventricular systolic pressure is 42.5 mmHg.   3. The mitral valve is normal in structure. Mild mitral valve  regurgitation. No evidence of mitral stenosis.   4. Tricuspid valve regurgitation is mild to moderate.   5. The aortic valve is normal in structure. Aortic valve regurgitation is  not visualized. Aortic valve sclerosis/calcification is present, without  any evidence of aortic stenosis.   6. Mild pulmonic stenosis.   7. The inferior vena cava is normal in size with greater than 50%  respiratory variability, suggesting right atrial pressure of 3 mmHg.   Patient Profile     88 y.o. male with history of hypertension, hyperlipidemia, HFpEF, CKD 4 presenting with worsening shortness of breath, being seen for HFpEF and atrial fibrillation.  Assessment & Plan    1.  HFpEF -Edema improving -Net -2.6 L over the past 24 hours, net - 11 L for the admission with BUN and CO2 up trending with down trending chloride and largely stable SCr -Transition from IV Lasix to torsemide 20 mg bid  -Titrate off steroids as tolerable per primary team, this is contributing to both edema and hypertension -Medication and dietary adherence recommended  2.  Persistent A-fib -Timing of onset unclear  -Continue rate control strategy -Asymptomatic, no plans for rhythm control strategy at this time -Coreg 6.25 mg twice daily -CHADS2VASc at least 4 (CHF, HTN, age x 2) -Eliquis 2.5 mg twice daily (age and SCr)    3.   Hypertension -BP elevated -Norvasc to 5 mg, Coreg 6.25 twice daily (rates preclude titration of beta blocker) -Titrate hydralazine to 50 mg 3 times daily  4.  Respiratory dysfunction, on steroids -Multifactorial including HFpEF and bronchitis -Weaned off BiPAP, now on 3 L via nasal cannula  -Recommend Tapering off steroids as clinical function permits  5. Elevated troponin: -Mildly elevated and flat trending -Not consistent with ACS -Likely supply demand ischemia in the setting of CKD stage IV, HFpEF, Afib, and bronchitis -Echo this admission with preserved LVSF -No plans for inpatient ischemic evaluation at this time       SignedChristell Faith, PA-C  06/25/2021, 7:46 AM

## 2021-06-25 NOTE — Progress Notes (Signed)
PROGRESS NOTE    Randy Dalton  MKT:[id number] DOB: 1927-08-06 DOA: 06/21/2021 PCP: Jerrol Banana., MD    Assessment & Plan:   Principal Problem:   Acute respiratory failure with hypoxia [facility]) Active Problems:   Acute on chronic diastolic CHF (congestive heart failure) (HCC)   Acute bronchitis   Sepsis ([place])   Essential (primary) hypertension   HLD (hyperlipidemia)   Adult hypothyroidism   Obstructive sleep apnea   Hypothermia   Elevated troponin   Obesity with body mass index (BMI) of 30.0 to 39.9   CKD (chronic kidney disease), stage IV (HCC)   Type II diabetes mellitus with renal manifestations ([place])   New onset atrial fibrillation (HCC)  Assessment and Plan: Acute hypoxic respiratory failure: likely secondary to CHF exacerbation. Weaned off of BiPAP. Continue on supplemental oxygen and wean as tolerated, currently on 2L [place]. Continue on IV lasix. Neg approx 2.6L    Acute on chronic diastolic CHF: echo in 8706 showed EF 70% with grade 1 diastolic dysfunction. Echo shows EF 58-26%, diastolic parameters are indeterminate, mild MR, mild-mod TR, & no atrial level shunt detected. IV lasix was changed to po torsemide today as per cardio. Monitor I/Os   Acute bronchitis: completed abxs. Continue on steroid taper, bronchodilators & encourage incentive spirometry   Sepsis: met criteria w/ leukocytosis, hypothermia, tachypnea & likely acute bronchitis. Continue on IV abxs. Sepsis resolved  HTN: continue on coreg, amlodipine, hydralazine. Hold losartan    HLD: continue on statin     Hypothyroidism: continue on home of levothyroxine   Obstructive sleep apnea: CPAP qhs    Hypothermia: resolved   Elevated troponin: likely secondary to demand ischemia   Obesity: BMI 33.1. Complicates overall care & prognosis    CKDIV: baseline Cr 1.8-2.0. Cr is labile   DM2: HbA1c 8.0, poorly controlled. Continue on SSI w/ accuchecks    A.fib: new onset. CHA2DS2-VASc Score is 5.  Continue on eliquis, coreg        DVT prophylaxis: eliquis  Code Status: DNR Family Communication: discussed pt's care w/ pt's family at bedside and answered their questions  Disposition Plan: likely d/c back home   Level of care: Progressive  Status is: Inpatient Remains inpatient appropriate because: severity of illness    Consultants:  Cardio   Procedures:   Antimicrobials:    Subjective: Pt c/o malaise  Objective: Vitals:   06/24/21 1209 06/24/21 2030 06/24/21 2355 06/25/21 0412  BP: 131/71 (!) 156/80 139/84 (!) 151/82  Pulse: (!) 56 66 (!) 59 86  Resp: _0 Temp: 97.6 F (36.4 C) 98.1 F (36.7 C) 97.8 F (36.6 C) 98 F (36.7 C)  TempSrc: Oral Oral  Oral  SpO2: 98% 94% 98% 98%  Weight:    87.6 kg  Height:    5' 4" (1.626 m)    Intake/Output Summary (Last 24 hours) at 06/25/2021 0738 Last data filed at 06/25/2021 0414 Gross per 24 hour  Intake 200 ml  Output 2850 ml  Net -2650 ml   Filed Weights   06/23/21 0529 06/24/21 0522 06/25/21 0412  Weight: 89.4 kg 89.2 kg 87.6 kg    Examination:  General exam: Appears comfortable  Respiratory system: decreased breath sounds b/l. Intermittent rales  Cardiovascular system: irregularly irregular. No rubs or clicks  Gastrointestinal system: Abd is soft, NT, obese & normal bowel sounds  Central nervous system: alert and oriented. Moves all extremities Psychiatry: Judgement and insight appear normal. Appropriate mood and affect  Data Reviewed: I have personally reviewed following labs and imaging studies  CBC: Recent Labs  Lab 06/21/21 0920 06/23/21 0425 06/24/21 0427 06/25/21 0526  WBC 19.7* 19.1* 15.4* 15.3*  NEUTROABS 17.4*  --   --   --   HGB 12.5* 11.8* 11.9* 13.0  HCT 38.1* 34.6* 35.0* 38.1*  MCV 95.7 92.8 91.4 90.9  PLT 179 177 181 803   Basic Metabolic Panel: Recent Labs  Lab 06/21/21 0920 06/22/21 0321 06/23/21 0425 06/24/21 0427 06/24/21 1839 06/24/21 2202  06/25/21 0526  NA 138 139 135 135  --   --  136  K 4.8 4.1 4.3 4.2  --   --  4.3  CL 109 109 102 101  --   --  98  CO2 _0 --   --  27  GLUCOSE 274* 250* 308* 328* 425* 424* 280*  BUN 49* 55* 67* 75*  --   --  82*  CREATININE 1.92* 2.10* 2.10* 2.01*  --   --  2.03*  CALCIUM 9.6 9.3 9.3 9.3  --   --  9.3  MG  --  2.1  --   --   --   --   --    GFR: Estimated Creatinine Clearance: 23.2 mL/min (A) (by C-G formula based on SCr of 2.03 mg/dL (H)). Liver Function Tests: Recent Labs  Lab 06/21/21 0920  AST 15  ALT 16  ALKPHOS 80  BILITOT 1.7*  PROT 7.2  ALBUMIN 4.0   No results for input(s): "LIPASE", "AMYLASE" in the last 168 hours. No results for input(s): "AMMONIA" in the last 168 hours. Coagulation Profile: Recent Labs  Lab 06/21/21 0920  INR 1.2   Cardiac Enzymes: No results for input(s): "CKTOTAL", "CKMB", "CKMBINDEX", "TROPONINI" in the last 168 hours. BNP (last 3 results) No results for input(s): "PROBNP" in the last 8760 hours. HbA1C: No results for input(s): "HGBA1C" in the last 72 hours. CBG: Recent Labs  Lab 06/23/21 1958 06/24/21 0802 06/24/21 1208 06/24/21 1705 06/24/21 2033  GLUCAP 377* 345* 396* 424* 420*   Lipid Profile: No results for input(s): "CHOL", "HDL", "LDLCALC", "TRIG", "CHOLHDL", "LDLDIRECT" in the last 72 hours. Thyroid Function Tests: No results for input(s): "TSH", "T4TOTAL", "FREET4", "T3FREE", "THYROIDAB" in the last 72 hours. Anemia Panel: No results for input(s): "VITAMINB12", "FOLATE", "FERRITIN", "TIBC", "IRON", "RETICCTPCT" in the last 72 hours. Sepsis Labs: Recent Labs  Lab 06/21/21 0920 06/21/21 1149 06/21/21 1446  PROCALCITON 0.24  --   --   LATICACIDVEN  --  1.4 1.2    Recent Results (from the past 240 hour(s))  Culture, blood (routine x 2)     Status: None (Preliminary result)   Collection Time: 06/21/21  9:20 AM   Specimen: BLOOD  Result Value Ref Range Status   Specimen Description BLOOD BLOOD RIGHT  ARM  Final   Special Requests   Final    BOTTLES DRAWN AEROBIC AND ANAEROBIC Blood Culture results may not be optimal due to an excessive volume of blood received in culture bottles   Culture   Final    NO GROWTH 3 DAYS Performed at [facility], [address] [place], [place] [address]    Report Status PENDING  Incomplete  SARS Coronavirus 2 by RT PCR (hospital order, performed in [facility] hospital lab) *cepheid single result test* Anterior Nasal Swab     Status: None   Collection Time: 06/21/21  9:20 AM   Specimen: Anterior Nasal Swab  Result  Value Ref Range Status   SARS Coronavirus 2 by RT PCR NEGATIVE NEGATIVE Final    Comment: (NOTE) SARS-CoV-2 target nucleic acids are NOT DETECTED.  The SARS-CoV-2 RNA is generally detectable in upper and lower respiratory specimens during the acute phase of infection. The lowest concentration of SARS-CoV-2 viral copies this assay can detect is 250 copies / mL. A negative result does not preclude SARS-CoV-2 infection and should not be used as the sole basis for treatment or other patient management decisions.  A negative result may occur with improper specimen collection / handling, submission of specimen other than nasopharyngeal swab, presence of viral mutation(s) within the areas targeted by this assay, and inadequate number of viral copies (<250 copies / mL). A negative result must be combined with clinical observations, patient history, and epidemiological information.  Fact Sheet for Patients:   https://www.patel.info/  Fact Sheet for Healthcare Providers: https://hall.com/  This test is not yet approved or  cleared by the [place] FDA and has been authorized for detection and/or diagnosis of SARS-CoV-2 by FDA under an Emergency Use Authorization (EUA).  This EUA will remain in effect (meaning this test can be used) for the duration of the COVID-19 declaration under  Section 564(b)(1) of the Act, 21 U.S.C. section 360bbb-3(b)(1), unless the authorization is terminated or revoked sooner.  Performed at [facility], [address] [place], [place] [address]   Culture, blood (routine x 2)     Status: None (Preliminary result)   Collection Time: 06/21/21  9:28 AM   Specimen: BLOOD  Result Value Ref Range Status   Specimen Description BLOOD RIGHT ANTECUBITAL  Final   Special Requests   Final    BOTTLES DRAWN AEROBIC AND ANAEROBIC Blood Culture adequate volume   Culture   Final    NO GROWTH 3 DAYS Performed at [facility], [address] [place], [place] [address]    Report Status PENDING  Incomplete  Respiratory (~20 pathogens) panel by PCR     Status: None   Collection Time: 06/21/21 12:45 PM   Specimen: Nasopharyngeal Swab; Respiratory  Result Value Ref Range Status   Adenovirus NOT DETECTED NOT DETECTED Final   Coronavirus 229E NOT DETECTED NOT DETECTED Final    Comment: (NOTE) The Coronavirus on the Respiratory Panel, DOES NOT test for the novel  Coronavirus (2019 nCoV)    Coronavirus HKU1 NOT DETECTED NOT DETECTED Final   Coronavirus NL63 NOT DETECTED NOT DETECTED Final   Coronavirus OC43 NOT DETECTED NOT DETECTED Final   Metapneumovirus NOT DETECTED NOT DETECTED Final   Rhinovirus / Enterovirus NOT DETECTED NOT DETECTED Final   Influenza A NOT DETECTED NOT DETECTED Final   Influenza B NOT DETECTED NOT DETECTED Final   Parainfluenza Virus 1 NOT DETECTED NOT DETECTED Final   Parainfluenza Virus 2 NOT DETECTED NOT DETECTED Final   Parainfluenza Virus 3 NOT DETECTED NOT DETECTED Final   Parainfluenza Virus 4 NOT DETECTED NOT DETECTED Final   Respiratory Syncytial Virus NOT DETECTED NOT DETECTED Final   Bordetella pertussis NOT DETECTED NOT DETECTED Final   Bordetella Parapertussis NOT DETECTED NOT DETECTED Final   Chlamydophila pneumoniae NOT DETECTED NOT DETECTED Final   Mycoplasma pneumoniae NOT DETECTED NOT  DETECTED Final    Comment: Performed at [facility], [place]. [address], [place],  [address]  MRSA Next Gen by PCR, Nasal     Status: None   Collection Time: 06/21/21 12:45 PM   Specimen: Nasal Mucosa; Nasal Swab  Result Value Ref Range Status   MRSA by PCR Next Gen NOT DETECTED NOT DETECTED Final    Comment: (NOTE) The GeneXpert MRSA Assay (FDA approved for NASAL specimens only), is one component of a comprehensive MRSA colonization surveillance program. It is not intended to diagnose MRSA infection nor to guide or monitor treatment for MRSA infections. Test performance is not FDA approved in patients less than 51 years old. Performed at [facility], [address], [place], [place] [address]          Radiology Studies: No results found.      Scheduled Meds:  amLODipine  5 mg Oral Daily   apixaban  2.5 mg Oral BID   azithromycin  500 mg Oral Daily   carvedilol  6.25 mg Oral BID WC   cholecalciferol  2,000 Units Oral Daily   furosemide  60 mg Intravenous Q12H   hydrALAZINE  25 mg Oral Q8H   insulin aspart  0-15 Units Subcutaneous TID WC   insulin aspart  0-5 Units Subcutaneous QHS   levothyroxine  175 mcg Oral Q0600   methylPREDNISolone (SOLU-MEDROL) injection  40 mg Intravenous Daily   multivitamin with minerals  1 tablet Oral Daily   polyethylene glycol  17 g Oral Daily   simvastatin  20 mg Oral q1800   Continuous Infusions:  cefTRIAXone (ROCEPHIN)  IV Stopped (06/24/21 0856)     LOS: 4 days    Time spent: 25 mins     Wyvonnia Dusky, MD [facility] Pager [phone]  If 7PM-7AM, please contact night-coverage 06/25/2021, 7:38 AM

## 2021-06-26 ENCOUNTER — Telehealth: Payer: Self-pay | Admitting: *Deleted

## 2021-06-26 ENCOUNTER — Other Ambulatory Visit (HOSPITAL_COMMUNITY): Payer: Self-pay

## 2021-06-26 DIAGNOSIS — J9601 Acute respiratory failure with hypoxia: Secondary | ICD-10-CM | POA: Diagnosis not present

## 2021-06-26 DIAGNOSIS — I4891 Unspecified atrial fibrillation: Secondary | ICD-10-CM

## 2021-06-26 DIAGNOSIS — I5033 Acute on chronic diastolic (congestive) heart failure: Secondary | ICD-10-CM | POA: Diagnosis not present

## 2021-06-26 LAB — CBC
HCT: 38.6 % — ABNORMAL LOW (ref 39.0–52.0)
Hemoglobin: 13.2 g/dL (ref 13.0–17.0)
MCH: 31.6 pg (ref 26.0–34.0)
MCHC: 34.2 g/dL (ref 30.0–36.0)
MCV: 92.3 fL (ref 80.0–100.0)
Platelets: 201 10*3/uL (ref 150–400)
RBC: 4.18 MIL/uL — ABNORMAL LOW (ref 4.22–5.81)
RDW: 13.5 % (ref 11.5–15.5)
WBC: 15.5 10*3/uL — ABNORMAL HIGH (ref 4.0–10.5)
nRBC: 0 % (ref 0.0–0.2)

## 2021-06-26 LAB — CULTURE, BLOOD (ROUTINE X 2)
Culture: NO GROWTH
Culture: NO GROWTH
Special Requests: ADEQUATE

## 2021-06-26 LAB — BASIC METABOLIC PANEL
Anion gap: 11 (ref 5–15)
BUN: 98 mg/dL — ABNORMAL HIGH (ref 8–23)
CO2: 26 mmol/L (ref 22–32)
Calcium: 9 mg/dL (ref 8.9–10.3)
Chloride: 98 mmol/L (ref 98–111)
Creatinine, Ser: 1.92 mg/dL — ABNORMAL HIGH (ref 0.61–1.24)
GFR, Estimated: 32 mL/min — ABNORMAL LOW (ref >60)
Glucose, Bld: 352 mg/dL — ABNORMAL HIGH (ref 70–99)
Potassium: 4.2 mmol/L (ref 3.5–5.1)
Sodium: 135 mmol/L (ref 135–145)

## 2021-06-26 LAB — GLUCOSE, CAPILLARY: Glucose-Capillary: 340 mg/dL — ABNORMAL HIGH (ref 70–99)

## 2021-06-26 MED ORDER — TORSEMIDE 20 MG PO TABS: 20.0000 mg | ORAL_TABLET | Freq: Two times a day (BID) | ORAL | 0 refills | Status: DC

## 2021-06-26 MED ORDER — CARVEDILOL 6.25 MG PO TABS: 6.2500 mg | ORAL_TABLET | Freq: Two times a day (BID) | ORAL | 0 refills | Status: DC

## 2021-06-26 MED ORDER — APIXABAN 2.5 MG PO TABS: 2.5000 mg | ORAL_TABLET | Freq: Two times a day (BID) | ORAL | 0 refills | Status: DC

## 2021-06-26 MED ORDER — AMLODIPINE BESYLATE 5 MG PO TABS: 5.0000 mg | ORAL_TABLET | Freq: Every day | ORAL | 0 refills | Status: DC

## 2021-06-26 MED ORDER — HYDRALAZINE HCL 50 MG PO TABS: 50.0000 mg | ORAL_TABLET | Freq: Three times a day (TID) | ORAL | 0 refills | Status: DC

## 2021-06-26 NOTE — Progress Notes (Signed)
Progress Note  Patient Name: Randy Dalton Date of Encounter: 06/26/2021  [facility] Cardiologist: Dionisio David, MD   Subjective   Patient seen on AM rounds. Denies any chest pain or shortness of breath. Walked with physical therapy this morning without difficulty. Remains on room air. -2.6L in the last 24 hours, creatinine down to 1.92  Inpatient Medications    Scheduled Meds:  amLODipine  5 mg Oral Daily   apixaban  2.5 mg Oral BID   carvedilol  6.25 mg Oral BID WC   cholecalciferol  2,000 Units Oral Daily   hydrALAZINE  50 mg Oral Q8H   insulin aspart  0-15 Units Subcutaneous TID WC   insulin aspart  0-5 Units Subcutaneous QHS   insulin aspart  3 Units Subcutaneous TID WC   insulin detemir  9 Units Subcutaneous Daily   levothyroxine  175 mcg Oral Q0600   multivitamin with minerals  1 tablet Oral Daily   polyethylene glycol  17 g Oral Daily   predniSONE  20 mg Oral Q breakfast   simvastatin  20 mg Oral q1800   torsemide  20 mg Oral BID   Continuous Infusions:  PRN Meds: acetaminophen, dextromethorphan-guaiFENesin, hydrALAZINE, ipratropium-albuterol, ondansetron (ZOFRAN) IV   Vital Signs    Vitals:   06/26/21 0033 06/26/21 0425 06/26/21 0803 06/26/21 0808  BP: 140/71 137/71 (!) 171/107 (!) 157/85  Pulse: 61 61 67 69  Resp: 18 17 16    Temp: 98.1 F (36.7 C) 97.9 F (36.6 C) 97.9 F (36.6 C)   TempSrc: Oral Oral Oral   SpO2: 92% 98% 94% 96%  Weight:  87.6 kg    Height:        Intake/Output Summary (Last 24 hours) at 06/26/2021 1103 Last data filed at 06/26/2021 0806 Gross per 24 hour  Intake --  Output 2600 ml  Net -2600 ml      06/26/2021    4:25 AM 06/25/2021    4:12 AM 06/24/2021    5:22 AM  Last 3 Weights  Weight (lbs) 193 lb 2 oz 193 lb 2 oz 196 lb 10.4 oz  Weight (kg) 87.6 kg 87.6 kg 89.2 kg      Telemetry    No longer on telemetry- Personally Reviewed  ECG    No new tracings - Personally Reviewed  Physical Exam   GEN: No acute  distress. Sitting bed watching TV Neck: No JVD appreciated Cardiac: RRR, I/VI systolic murmur, without rubs, or gallops.  Respiratory: Clear to auscultation bilaterally. Respirations are unlabored on room air at rest GI: Soft, nontender, non-distended , obese, bowel sounds present in all 4 quadrants MS: Trace nonpitting edema; No deformity. Neuro:  Nonfocal  Psych: Normal affect   Labs    High Sensitivity Troponin:   Recent Labs  Lab 06/21/21 0920 06/21/21 1149  TROPONINIHS 24* 24*     Chemistry Recent Labs  Lab 06/21/21 0920 06/22/21 0321 06/23/21 0425 06/24/21 0427 06/24/21 1839 06/24/21 2202 06/25/21 0526 06/26/21 0530  NA 138 139   < > 135  --   --  136 135  K 4.8 4.1   < > 4.2  --   --  4.3 4.2  CL 109 109   < > 101  --   --  98 98  CO2 22 22   < > 25  --   --  27 26  GLUCOSE 274* 250*   < > 328*   < > 424* 280* 352*  BUN  49* 55*   < > 75*  --   --  82* 98*  CREATININE 1.92* 2.10*   < > 2.01*  --   --  2.03* 1.92*  CALCIUM 9.6 9.3   < > 9.3  --   --  9.3 9.0  MG  --  2.1  --   --   --   --   --   --   PROT 7.2  --   --   --   --   --   --   --   ALBUMIN 4.0  --   --   --   --   --   --   --   AST 15  --   --   --   --   --   --   --   ALT 16  --   --   --   --   --   --   --   ALKPHOS 80  --   --   --   --   --   --   --   BILITOT 1.7*  --   --   --   --   --   --   --   GFRNONAA 32* 29*   < > 31*  --   --  30* 32*  ANIONGAP 7 8   < > 9  --   --  11 11   < > = values in this interval not displayed.    Lipids No results for input(s): "CHOL", "TRIG", "HDL", "LABVLDL", "LDLCALC", "CHOLHDL" in the last 168 hours.  Hematology Recent Labs  Lab 06/24/21 0427 06/25/21 0526 06/26/21 0530  WBC 15.4* 15.3* 15.5*  RBC 3.83* 4.19* 4.18*  HGB 11.9* 13.0 13.2  HCT 35.0* 38.1* 38.6*  MCV 91.4 90.9 92.3  MCH 31.1 31.0 31.6  MCHC 34.0 34.1 34.2  RDW 13.7 13.6 13.5  PLT 181 206 201   Thyroid No results for input(s): "TSH", "FREET4" in the last 168 hours.   BNP Recent Labs  Lab 06/21/21 0920  BNP 562.0*    DDimer No results for input(s): "DDIMER" in the last 168 hours.   Radiology    No results found.  Cardiac Studies   Echocardiogram completed on 06/21/2021 1. Left ventricular ejection fraction, by estimation, is 60 to 65%. The  left ventricle has normal function. The left ventricle has no regional  wall motion abnormalities. Left ventricular diastolic parameters are  indeterminate.   2. Right ventricular systolic function is normal. The right ventricular  size is normal. There is mildly elevated pulmonary artery systolic  pressure. The estimated right ventricular systolic pressure is 38.4 mmHg.   3. The mitral valve is normal in structure. Mild mitral valve  regurgitation. No evidence of mitral stenosis.   4. Tricuspid valve regurgitation is mild to moderate.   5. The aortic valve is normal in structure. Aortic valve regurgitation is  not visualized. Aortic valve sclerosis/calcification is present, without  any evidence of aortic stenosis.   6. Mild pulmonic stenosis.   7. The inferior vena cava is normal in size with greater than 50%  respiratory variability, suggesting right atrial pressure of 3 mmHg.   Patient Profile     88 y.o. male with a history of essential hypertension, hyperlipidemia,HFpEF, CKD 4 presenting with worsening shortness of breath, being seen for HFpEF and atrial fibrillation  Assessment & Plan    HFpEF -Edema has improved -Shortness  of breath improved with rest and exertion walk with physical therapy and remains on room air -Continue torsemide 20 mg twice daily -Net balance negative 2.6 liters in the last 24 hours -Daily weight, I's and O's, low-sodium diet -Medication and dietary adherence discussed with patient, wife, and daughter  2.  Persistent atrial fibrillation -Rate controlled -Asymptomatic with no plans for control strategy at this time -Continue Coreg 12.5 mg twice daily -CHA2DS2-VASc  score of at least 4 -Eliquis 2.5 mg twice daily (age and SCr)  3.  Hypertension -Blood pressure has been better controlled -Continue Norvasc 5 mg daily -Continue Coreg 12.5 mg twice daily -Continue hydralazine 100 mg twice daily  4.  Respiratory dysfunction -Multifactorial including bronchitis and HFpEF -Weaned off of oxygen -Tapering of steroids per medicine team  5.  Elevated high-sensitivity troponins -Mildly elevated but trended flat -Not consistent with ACS -Likely demand ischemia in the setting of CKD stage V, heart failure preserved ejection fraction, atrial fibrillation, and bronchitis exacerbation -Echocardiogram completed which showed no wall motion abnormalities no plan for further ischemic work-up     For questions or updates, please contact [facility] Please consult [email or web address] for contact info under        Signed, Sokha Craker, NP  06/26/2021, 11:03 AM

## 2021-06-26 NOTE — Telephone Encounter (Signed)
-----   Message from Gerrie Nordmann, NP sent at 06/26/2021 12:12 PM EDT ----- Regarding: hospital follow up Please schedule patient hospital follow up in 1-2 weeks  Thank you

## 2021-06-26 NOTE — Telephone Encounter (Signed)
Currently admitted 6/20

## 2021-06-26 NOTE — TOC Benefit Eligibility Note (Signed)
Patient Teacher, English as a foreign language completed.    The patient is currently admitted and upon discharge could be taking Eliquis 5 mg.  The current 30 day co-pay is, $153.19 due to being in Coverage Gap (donut hole).   The patient is insured through [place], [place] Patient Advocate Specialist [place] Patient Advocate Team Direct Number: [phone]  Fax: [phone]

## 2021-06-26 NOTE — Consult Note (Addendum)
Heart Failure Nurse Navigator Note   HFpEF 60 to 65%.  Indeterminate diastolic dysfunction.  Mildly elevated pulmonary artery systolic pressures.  He presented to the emergency room with shortness of breath.  BNP was noted to be 562.  Was subtoxic with saturations in the 60s.  Comorbidities:  GERD Hypothyroidism Diabetes Hyperlipidemia Hypertension Obstructive sleep apnea on CPAP Chronic kidney disease stage IV Atrial fibrillation  Medications:  Amlodipine 5 mg daily Apixaban 2.5 mg daily Carvedilol 6.25 mg 2 times a day with meals If hydralazine 50 mg every 8 hours Levothyroxine 175 mcg daily Simvastatin 20 mg daily Torsemide 20 mg 2 times a day  Labs:  Sodium 135, potassium 4.2, chloride 98, CO2 26, BUN 98, creatinine 1.92, GFR 32 Weight is 87.6 kg Intake not documented Output 1950 mL  Initial meeting with patient on this admission, wife and daughter were at the bedside.  Discussed how he takes care of himself at home.  Wife states that he is not compliant with taking his diuretic daily, even if she points out to him that his ankles are looking puffy.  Stressed the importance of being compliant with his medications, following daily weights and what to report.  Went over the importance of the relationship between salt and sodium and fluids.  Also discussed the importance of follow-up in the outpatient heart failure clinic that she has an appointment on July 10 at 4 PM.  He has a 0% no-show ratio.  Again stressed the importance of compliance with medication, daily weights, sodium restriction and fluid restriction and maintaining a healthy life and to aid in keeping him out of the hospital.  Discussed the ventricle health program with the patient and family, for which he does qualify.  But he states he does not have a smart phone.  He was given the living with heart failure teaching booklet, zone magnet, info on low-sodium, heart failure and weight chart.  Pricilla Riffle RN [facility]

## 2021-06-26 NOTE — Progress Notes (Signed)
Occupational Therapy Treatment Patient Details Name: Randy Dalton MRN: [id number] DOB: 1927-02-11 Today's Date: 06/26/2021   History of present illness Randy Dalton is a 88 y.o. male with medical history significant of hypertension, hyperlipidemia, diabetes mellitus, GERD, hypothyroidism, OSA on CPAP, CKD-4, CHF, presents with worsening shortness of breath.   OT comments  Randy Dalton continues to show progress and makes good effort today. He is able to perform bed mobility, transfers, and ambulation without physical assistance; he demonstrates good dynamic standing balance, reaching beyond base of support, with and without RW. Daughter and spouse, present during session, report that pt is very stubborn about managing his DM and taking his medications. Provided educ re: importance of this and generated ideas for increasing compliance. Pt verbalized understanding but continued to say that he likely would not take them as prescribed because he didn't like them (e.g., says that pills get stuck in his throat and he doesn't like the taste of them). Discussed falls prevention, safe use of RW, with patient saying that he believed using a cane or RW would increase the likelihood of his falling. Recommended that he continue to address these issues with HHOT.    Recommendations for follow up therapy are one component of a multi-disciplinary discharge planning process, led by the attending physician.  Recommendations may be updated based on patient status, additional functional criteria and insurance authorization.    Follow Up Recommendations  Home health OT    Assistance Recommended at Discharge Intermittent Supervision/Assistance  Patient can return home with the following  A little help with walking and/or transfers;A little help with bathing/dressing/bathroom   Equipment Recommendations  None recommended by OT    Recommendations for Other Services      Precautions / Restrictions  Precautions Precautions: Fall Restrictions Weight Bearing Restrictions: No       Mobility Bed Mobility Overal bed mobility: Needs Assistance Bed Mobility: Supine to Sit, Sit to Supine     Supine to sit: Supervision, HOB elevated Sit to supine: Supervision   General bed mobility comments: increased time and effort    Transfers Overall transfer level: Needs assistance Equipment used: Rolling walker (2 wheels) Transfers: Sit to/from Stand Sit to Stand: Supervision                 Balance Overall balance assessment: Needs assistance Sitting-balance support: Feet supported Sitting balance-Leahy Scale: Good Sitting balance - Comments: good sitting balance, able to reach beyond BOS and to self-correct for occasional posterior lean   Standing balance support: Bilateral upper extremity supported, No upper extremity supported Standing balance-Leahy Scale: Good Standing balance comment: trialed with and w/o RW                           ADL either performed or assessed with clinical judgement   ADL                                              Extremity/Trunk Assessment Upper Extremity Assessment Upper Extremity Assessment: Generalized weakness   Lower Extremity Assessment Lower Extremity Assessment: Generalized weakness   Cervical / Trunk Assessment Cervical / Trunk Assessment: Normal    Vision       Perception     Praxis      Cognition Arousal/Alertness: Awake/alert Behavior During Therapy: WFL for tasks assessed/performed Overall Cognitive Status:  Within Functional Limits for tasks assessed                                 General Comments: patient able to follow directions but is very talkative and required occasional cues for re-direction and attention to task.        Exercises Other Exercises Other Exercises: Educ re: medication mgmt, importance of compliance, falls prevention, safe use of DME     Shoulder Instructions       General Comments Enouraged pt to at least trial RW at home, as he argues that he thinks his balance will be better w/o use of DME    Pertinent Vitals/ Pain       Pain Assessment Pain Assessment: No/denies pain  Home Living                                          Prior Functioning/Environment              Frequency  Min 2X/week        Progress Toward Goals  OT Goals(current goals can now be found in the care plan section)  Progress towards OT goals: Progressing toward goals  Acute Rehab OT Goals OT Goal Formulation: With patient/family Time For Goal Achievement: 07/07/21 Potential to Achieve Goals: Good  Plan Discharge plan remains appropriate;Frequency remains appropriate    Co-evaluation                 AM-PAC OT "6 Clicks" Daily Activity     Outcome Measure   Help from another person eating meals?: A Little Help from another person taking care of personal grooming?: A Little Help from another person toileting, which includes using toliet, bedpan, or urinal?: A Little Help from another person bathing (including washing, rinsing, drying)?: A Little Help from another person to put on and taking off regular upper body clothing?: A Little Help from another person to put on and taking off regular lower body clothing?: A Little 6 Click Score: 18    End of Session Equipment Utilized During Treatment: Rolling walker (2 wheels)  OT Visit Diagnosis: Other abnormalities of gait and mobility (R26.89);Muscle weakness (generalized) (M62.81)   Activity Tolerance Patient tolerated treatment well   Patient Left in bed;with call bell/phone within reach;with bed alarm set;with family/visitor present   Nurse Communication          Time: 1572-6203 OT Time Calculation (min): 21 min  Charges: OT General Charges $OT Visit: 1 Visit OT Treatments $Self Care/Home Management : 8-22 mins Josiah Lobo, PhD, MS,  OTR/L 06/26/21, 3:39 PM

## 2021-06-26 NOTE — Discharge Summary (Addendum)
Physician Discharge Summary  [name]:[id number] DOB: 1927-07-24 DOA: 06/21/2021  PCP: Jerrol Banana., MD  Admit date: 06/21/2021 Discharge date: 06/26/2021  Admitted From: home  Disposition:  home w/ home health   Recommendations for Outpatient Follow-up:  Follow up with PCP in 1-2 weeks F/u w/ cardio in 1-2 weeks  Home Health:  yes Equipment/Devices:  Discharge Condition: stable CODE STATUS: DNR  Diet recommendation: Heart Healthy / Carb Modified   Brief/Interim Summary: HPI was taken from Dr. Blaine Hamper: Randy Dalton is a 88 y.o. male with medical history significant of hypertension, hyperlipidemia, diabetes mellitus, GERD, hypothyroidism, OSA on CPAP, CKD-4, CHF, presents with shortness of breath.   Per patient's daughter and wife at the bedside, patient's shortness of breath started in the early morning at about 3 AM, which has been progressively worsening.  Patient does not have chest pain or cough.  He has audible wheezing.  No fever or chills. Patient is not wearing oxygen normally. Patient was found to have oxygen desaturation to 60s% on room air, with severe respiratory distress, cannot speak in full sentence, using accessory muscle for breathing.  Initially 4 L oxygen was started with saturation 90s, then BiPAP started in ED.  Patient does not have nausea vomiting, diarrhea or abdominal pain.  No symptoms of UTI.  Per her daughter, patient is supposed to use CPAP, but has not been using it for more than 3 months.  They are waiting for a new machine coming.     Data Reviewed and ED Course: pt was found to have BNP 562, troponin level 24, stable renal function, pending COVID PCR, hypothermia with temperature 94.3, blood pressure 113/59, heart rate 59, RR 26, VBG with pH 7.23, CO2 51, O2 60.  Chest x-ray showed cardiomegaly with interstitial opacity.  Patient is admitted to stepdown bed as inpatient     EKG: I have personally reviewed.  Sinus rhythm, QTc 467, LAD, PVC,  nonspecific T wave change     As per Dr. Jimmye Norman 6/16-6/20/23: Pt presented w/ respiratory failure likely secondary to CHF exacerbation. Pt ws initially treated w/ IV lasix, supplemental oxygen. Pt was able to be weaned off of supplemental oxygen prior to d/c. IV lasix was later transitions to po torsemide as per cardio. Of note, pt was also found to have acute bronchitis and was treated w/ IV abxs & steroids. Pt completed abx & steroid course while inpatient. Also, pt was found to have persistent a. fib of unknown duration and was started on eliquis as per cardio. For more information, please see previous progress/consult notes.   Discharge Diagnoses:  Principal Problem:   Acute respiratory failure with hypoxia (HCC) Active Problems:   Acute on chronic diastolic CHF (congestive heart failure) (HCC)   Acute bronchitis   Sepsis ([place])   Essential (primary) hypertension   HLD (hyperlipidemia)   Adult hypothyroidism   Obstructive sleep apnea   Hypothermia   Elevated troponin   Obesity with body mass index (BMI) of 30.0 to 39.9   CKD (chronic kidney disease), stage IV (HCC)   Type II diabetes mellitus with renal manifestations ([place])   New onset atrial fibrillation (HCC)  Acute hypoxic respiratory failure: likely secondary to CHF exacerbation. Weaned off of BiPAP. Weaned off of supplemental oxygen.  D/c lasix and started on torsemide as per cardio.  Acute on chronic diastolic CHF: echo in 9528 showed EF 70% with grade 1 diastolic dysfunction. Echo shows EF 41-32%, diastolic parameters are indeterminate, mild  MR, mild-mod TR, & no atrial level shunt detected. IV lasix was changed to po torsemide as per cardio. Monitor I/Os    Acute bronchitis: completed abx & steroid courses. Continue on bronchodilators    Sepsis: met criteria w/ leukocytosis, hypothermia, tachypnea & likely acute bronchitis. Continue on IV abxs. Sepsis resolved   HTN: continue on coreg, amlodipine, hydralazine. Hold  losartan    HLD: continue on statin     Hypothyroidism: continue on home of levothyroxine    Obstructive sleep apnea: CPAP qhs    Hypothermia: resolved    Elevated troponin: likely secondary to demand ischemia    Obesity: BMI 33.1. Complicates overall care & prognosis    CKDIV: baseline Cr 1.8-2.0. Cr is labile    DM2: HbA1c 8.0, poorly controlled. Continue on SSI w/ accuchecks    A.fib: new onset, likely persistent. CHA2DS2-VASc Score is 5. Continue on eliquis, coreg  Discharge Instructions  Discharge Instructions     Diet - low sodium heart healthy   Complete by: As directed    Diet Carb Modified   Complete by: As directed    Discharge instructions   Complete by: As directed    F/u w/ PCP in 1-2 weeks. F/u w/ cardio in 1-2 weeks   Increase activity slowly   Complete by: As directed       Allergies as of 06/26/2021       Reactions   Aspirin Other (See Comments)   Feel bad Feel bad   Codeine Nausea Only        Medication List     STOP taking these medications    losartan 100 MG tablet Commonly known as: COZAAR   meloxicam 7.5 MG tablet Commonly known as: MOBIC   potassium chloride SA 20 MEQ tablet Commonly known as: KLOR-CON M   sildenafil 100 MG tablet Commonly known as: VIAGRA       TAKE these medications    acetaminophen 650 MG CR tablet Commonly known as: TYLENOL Take 650 mg by mouth every 8 (eight) hours as needed for mild pain.   amLODipine 5 MG tablet Commonly known as: NORVASC Take 1 tablet (5 mg total) by mouth daily. Start taking on: June 27, 2021 What changed:  medication strength See the new instructions.   apixaban 2.5 MG Tabs tablet Commonly known as: ELIQUIS Take 1 tablet (2.5 mg total) by mouth 2 (two) times daily.   carvedilol 6.25 MG tablet Commonly known as: COREG Take 1 tablet (6.25 mg total) by mouth 2 (two) times daily with a meal. What changed:  medication strength how much to take when to take this    glimepiride 4 MG tablet Commonly known as: AMARYL TAKE 1 TABLET(4 MG) BY MOUTH DAILY   hydrALAZINE 50 MG tablet Commonly known as: APRESOLINE Take 1 tablet (50 mg total) by mouth every 8 (eight) hours. What changed:  medication strength how much to take when to take this   levothyroxine 175 MCG tablet Commonly known as: SYNTHROID TAKE 1 TABLET BY MOUTH EVERY DAY What changed:  how much to take how to take this when to take this additional instructions   multivitamin tablet Take 1 tablet by mouth daily.   simvastatin 20 MG tablet Commonly known as: ZOCOR TAKE 1 TABLET(20 MG) BY MOUTH AT BEDTIME   torsemide 20 MG tablet Commonly known as: DEMADEX Take 1 tablet (20 mg total) by mouth 2 (two) times daily. What changed: when to take this   Tradjenta 5 MG Tabs  tablet Generic drug: linagliptin TAKE 1 TABLET(5 MG) BY MOUTH DAILY   Vitamin D 50 MCG (2000 UT) tablet Take 2,000 Units by mouth daily.        Allergies  Allergen Reactions   Aspirin Other (See Comments)    Feel bad Feel bad   Codeine Nausea Only    Consultations: Cardio    Procedures/Studies: ECHOCARDIOGRAM COMPLETE  Result Date: 06/21/2021    ECHOCARDIOGRAM REPORT   Patient Name:   Randy Dalton Date of Exam: 06/21/2021 Medical Rec #:  [id number]     Height:       64.0 in Accession #:    [id number]    Weight:       209.2 lb Date of Birth:  March 06, 1927     BSA:          1.994 m Patient Age:    22 years      BP:           110/53 mmHg Patient Gender: M             HR:           63 bpm. Exam Location:  [facility] Procedure: 2D Echo, Color Doppler and Cardiac Doppler Indications:     I50.31 congestive heart failure-Acute Diastolic  History:         Patient has prior history of Echocardiogram examinations. CHF,                  CKD; Risk Factors:Hypertension, Diabetes, HCL and Sleep Apnea.  Sonographer:     Charmayne Sheer Referring Phys:  [id number] Ivor Costa Diagnosing Phys: Ida Rogue MD  Sonographer Comments: Suboptimal  apical window. IMPRESSIONS  1. Left ventricular ejection fraction, by estimation, is 60 to 65%. The left ventricle has normal function. The left ventricle has no regional wall motion abnormalities. Left ventricular diastolic parameters are indeterminate.  2. Right ventricular systolic function is normal. The right ventricular size is normal. There is mildly elevated pulmonary artery systolic pressure. The estimated right ventricular systolic pressure is 14.4 mmHg.  3. The mitral valve is normal in structure. Mild mitral valve regurgitation. No evidence of mitral stenosis.  4. Tricuspid valve regurgitation is mild to moderate.  5. The aortic valve is normal in structure. Aortic valve regurgitation is not visualized. Aortic valve sclerosis/calcification is present, without any evidence of aortic stenosis.  6. Mild pulmonic stenosis.  7. The inferior vena cava is normal in size with greater than 50% respiratory variability, suggesting right atrial pressure of 3 mmHg. FINDINGS  Left Ventricle: Left ventricular ejection fraction, by estimation, is 60 to 65%. The left ventricle has normal function. The left ventricle has no regional wall motion abnormalities. The left ventricular internal cavity size was normal in size. There is  no left ventricular hypertrophy. Left ventricular diastolic parameters are indeterminate. Right Ventricle: The right ventricular size is normal. No increase in right ventricular wall thickness. Right ventricular systolic function is normal. There is mildly elevated pulmonary artery systolic pressure. The tricuspid regurgitant velocity is 3.14  m/s, and with an assumed right atrial pressure of 5 mmHg, the estimated right ventricular systolic pressure is 31.5 mmHg. Left Atrium: Left atrial size was normal in size. Right Atrium: Right atrial size was normal in size. Pericardium: There is no evidence of pericardial effusion. Mitral Valve: The mitral valve is normal in structure. Mild mitral valve  regurgitation. No evidence of mitral valve stenosis. MV peak gradient, 5.3 mmHg. The mean mitral valve gradient is  2.0 mmHg. Tricuspid Valve: The tricuspid valve is normal in structure. Tricuspid valve regurgitation is mild to moderate. No evidence of tricuspid stenosis. Aortic Valve: The aortic valve is normal in structure. Aortic valve regurgitation is not visualized. Aortic valve sclerosis/calcification is present, without any evidence of aortic stenosis. Aortic valve mean gradient measures 8.5 mmHg. Aortic valve peak  gradient measures 15.6 mmHg. Aortic valve area, by VTI measures 1.66 cm. Pulmonic Valve: The pulmonic valve was normal in structure. Pulmonic valve regurgitation is not visualized. Mild pulmonic stenosis. Aorta: The aortic root is normal in size and structure. Venous: The inferior vena cava is normal in size with greater than 50% respiratory variability, suggesting right atrial pressure of 3 mmHg. IAS/Shunts: No atrial level shunt detected by color flow Doppler.  LEFT VENTRICLE PLAX 2D LVIDd:         4.33 cm   Diastology LVIDs:         2.29 cm   LV e' medial:    9.36 cm/s LV PW:         1.18 cm   LV E/e' medial:  11.0 LV IVS:        0.80 cm   LV e' lateral:   12.20 cm/s LVOT diam:     2.00 cm   LV E/e' lateral: 8.4 LV SV:         64 LV SV Index:   32 LVOT Area:     3.14 cm  RIGHT VENTRICLE RV Basal diam:  3.76 cm LEFT ATRIUM           Index        RIGHT ATRIUM           Index LA diam:      4.50 cm 2.26 cm/m   RA Area:     19.30 cm LA Vol (A2C): 45.5 ml 22.82 ml/m  RA Volume:   57.20 ml  28.68 ml/m LA Vol (A4C): 55.8 ml 27.98 ml/m  AORTIC VALVE                     PULMONIC VALVE AV Area (Vmax):    1.57 cm      PV Vmax:          0.87 m/s AV Area (Vmean):   1.52 cm      PV Vmean:         52.000 cm/s AV Area (VTI):     1.66 cm      PV VTI:           0.151 m AV Vmax:           197.50 cm/s   PV Peak grad:     3.0 mmHg AV Vmean:          138.000 cm/s  PV Mean grad:     1.0 mmHg AV VTI:             0.389 m       PR End Diast Vel: 4.75 msec AV Peak Grad:      15.6 mmHg AV Mean Grad:      8.5 mmHg LVOT Vmax:         98.40 cm/s LVOT Vmean:        66.900 cm/s LVOT VTI:          0.205 m LVOT/AV VTI ratio: 0.53  AORTA Ao Root diam: 3.50 cm MITRAL VALVE  TRICUSPID VALVE MV Area (PHT): 3.53 cm     TR Peak grad:   39.4 mmHg MV Area VTI:   2.52 cm     TR Vmax:        314.00 cm/s MV Peak grad:  5.3 mmHg MV Mean grad:  2.0 mmHg     SHUNTS MV Vmax:       1.15 m/s     Systemic VTI:  0.20 m MV Vmean:      63.3 cm/s    Systemic Diam: 2.00 cm MV Decel Time: 215 msec MV E velocity: 102.70 cm/s Ida Rogue MD Electronically signed by Ida Rogue MD Signature Date/Time: 06/21/2021/3:09:58 PM    Final    DG Chest Portable 1 View  Result Date: 06/21/2021 CLINICAL DATA:  Shortness of breath. EXAM: PORTABLE CHEST 1 VIEW COMPARISON:  Chest radiographs 01/29/2017 FINDINGS: The patient is rotated to the right. The cardiac silhouette is mildly enlarged. There are symmetric bilateral interstitial opacities which are greater than on the prior study, and there are small bilateral pleural effusions. No pneumothorax is identified. No acute osseous abnormality is seen. IMPRESSION: Cardiomegaly and bilateral interstitial opacities compatible with edema. Small pleural effusions. Electronically Signed   By: Logan Bores M.D.   On: 06/21/2021 09:53   (Echo, Carotid, EGD, Colonoscopy, ERCP)    Subjective: Pt denies any complaints    Discharge Exam: Vitals:   06/26/21 0803 06/26/21 0808  BP: (!) 171/107 (!) 157/85  Pulse: 67 69  Resp: 16   Temp: 97.9 F (36.6 C)   SpO2: 94% 96%   Vitals:   06/26/21 0033 06/26/21 0425 06/26/21 0803 06/26/21 0808  BP: 140/71 137/71 (!) 171/107 (!) 157/85  Pulse: 61 61 67 69  Resp: _0 Temp: 98.1 F (36.7 C) 97.9 F (36.6 C) 97.9 F (36.6 C)   TempSrc: Oral Oral Oral   SpO2: 92% 98% 94% 96%  Weight:  87.6 kg    Height:        General: Pt is alert, awake,  not in acute distress Cardiovascular: irregularly irregular, no rubs, no gallops Respiratory: CTA bilaterally, no wheezing, no rhonchi Abdominal: Soft, NT, obese, bowel sounds + Extremities:  no cyanosis    The results of significant diagnostics from this hospitalization (including imaging, microbiology, ancillary and laboratory) are listed below for reference.     Microbiology: Recent Results (from the past 240 hour(s))  Culture, blood (routine x 2)     Status: None   Collection Time: 06/21/21  9:20 AM   Specimen: BLOOD  Result Value Ref Range Status   Specimen Description BLOOD BLOOD RIGHT ARM  Final   Special Requests   Final    BOTTLES DRAWN AEROBIC AND ANAEROBIC Blood Culture results may not be optimal due to an excessive volume of blood received in culture bottles   Culture   Final    NO GROWTH 5 DAYS Performed at [facility], [address] [place], [place] [address]    Report Status 06/26/2021 FINAL  Final  SARS Coronavirus 2 by RT PCR (hospital order, performed in [facility] hospital lab) *cepheid single result test* Anterior Nasal Swab     Status: None   Collection Time: 06/21/21  9:20 AM   Specimen: Anterior Nasal Swab  Result Value Ref Range Status   SARS Coronavirus 2 by RT PCR NEGATIVE NEGATIVE Final    Comment: (NOTE) SARS-CoV-2 target nucleic acids are NOT DETECTED.  The SARS-CoV-2 RNA is generally detectable  in upper and lower respiratory specimens during the acute phase of infection. The lowest concentration of SARS-CoV-2 viral copies this assay can detect is 250 copies / mL. A negative result does not preclude SARS-CoV-2 infection and should not be used as the sole basis for treatment or other patient management decisions.  A negative result may occur with improper specimen collection / handling, submission of specimen other than nasopharyngeal swab, presence of viral mutation(s) within the areas targeted by this assay, and inadequate number of  viral copies (<250 copies / mL). A negative result must be combined with clinical observations, patient history, and epidemiological information.  Fact Sheet for Patients:   https://www.patel.info/  Fact Sheet for Healthcare Providers: https://hall.com/  This test is not yet approved or  cleared by the [place] FDA and has been authorized for detection and/or diagnosis of SARS-CoV-2 by FDA under an Emergency Use Authorization (EUA).  This EUA will remain in effect (meaning this test can be used) for the duration of the COVID-19 declaration under Section 564(b)(1) of the Act, 21 U.S.C. section 360bbb-3(b)(1), unless the authorization is terminated or revoked sooner.  Performed at [facility], [address] [place], [place] [address]   Culture, blood (routine x 2)     Status: None   Collection Time: 06/21/21  9:28 AM   Specimen: BLOOD  Result Value Ref Range Status   Specimen Description BLOOD RIGHT ANTECUBITAL  Final   Special Requests   Final    BOTTLES DRAWN AEROBIC AND ANAEROBIC Blood Culture adequate volume   Culture   Final    NO GROWTH 5 DAYS Performed at [facility], [address] [place], [place] [address]    Report Status 06/26/2021 FINAL  Final  Respiratory (~20 pathogens) panel by PCR     Status: None   Collection Time: 06/21/21 12:45 PM   Specimen: Nasopharyngeal Swab; Respiratory  Result Value Ref Range Status   Adenovirus NOT DETECTED NOT DETECTED Final   Coronavirus 229E NOT DETECTED NOT DETECTED Final    Comment: (NOTE) The Coronavirus on the Respiratory Panel, DOES NOT test for the novel  Coronavirus (2019 nCoV)    Coronavirus HKU1 NOT DETECTED NOT DETECTED Final   Coronavirus NL63 NOT DETECTED NOT DETECTED Final   Coronavirus OC43 NOT DETECTED NOT DETECTED Final   Metapneumovirus NOT DETECTED NOT DETECTED Final   Rhinovirus / Enterovirus NOT DETECTED NOT DETECTED Final   Influenza  A NOT DETECTED NOT DETECTED Final   Influenza B NOT DETECTED NOT DETECTED Final   Parainfluenza Virus 1 NOT DETECTED NOT DETECTED Final   Parainfluenza Virus 2 NOT DETECTED NOT DETECTED Final   Parainfluenza Virus 3 NOT DETECTED NOT DETECTED Final   Parainfluenza Virus 4 NOT DETECTED NOT DETECTED Final   Respiratory Syncytial Virus NOT DETECTED NOT DETECTED Final   Bordetella pertussis NOT DETECTED NOT DETECTED Final   Bordetella Parapertussis NOT DETECTED NOT DETECTED Final   Chlamydophila pneumoniae NOT DETECTED NOT DETECTED Final   Mycoplasma pneumoniae NOT DETECTED NOT DETECTED Final    Comment: Performed at [facility], [place] [address] [place], [place] [address]  MRSA Next Gen by PCR, Nasal     Status: None   Collection Time: 06/21/21 12:45 PM   Specimen: Nasal Mucosa; Nasal Swab  Result Value Ref Range Status   MRSA by PCR Next Gen NOT DETECTED NOT DETECTED Final    Comment: (NOTE) The GeneXpert MRSA Assay (FDA approved for NASAL specimens only), is one component  of a comprehensive MRSA colonization surveillance program. It is not intended to diagnose MRSA infection nor to guide or monitor treatment for MRSA infections. Test performance is not FDA approved in patients less than 21 years old. Performed at [facility], [address] [place], [place] [address]      Labs: BNP (last 3 results) Recent Labs    06/21/21 0920  BNP 562.5*   Basic Metabolic Panel: Recent Labs  Lab 06/22/21 0321 06/23/21 0425 06/24/21 0427 06/24/21 1839 06/24/21 2202 06/25/21 0526 06/26/21 0530  NA 139 135 135  --   --  136 135  K 4.1 4.3 4.2  --   --  4.3 4.2  CL 109 102 101  --   --  98 98  CO2 _0 --   --  27 26  GLUCOSE 250* 308* 328* 425* 424* 280* 352*  BUN 55* 67* 75*  --   --  82* 98*  CREATININE 2.10* 2.10* 2.01*  --   --  2.03* 1.92*  CALCIUM 9.3 9.3 9.3  --   --  9.3 9.0  MG 2.1  --   --   --   --   --   --    Liver Function Tests: Recent Labs   Lab 06/21/21 0920  AST 15  ALT 16  ALKPHOS 80  BILITOT 1.7*  PROT 7.2  ALBUMIN 4.0   No results for input(s): "LIPASE", "AMYLASE" in the last 168 hours. No results for input(s): "AMMONIA" in the last 168 hours. CBC: Recent Labs  Lab 06/21/21 0920 06/23/21 0425 06/24/21 0427 06/25/21 0526 06/26/21 0530  WBC 19.7* 19.1* 15.4* 15.3* 15.5*  NEUTROABS 17.4*  --   --   --   --   HGB 12.5* 11.8* 11.9* 13.0 13.2  HCT 38.1* 34.6* 35.0* 38.1* 38.6*  MCV 95.7 92.8 91.4 90.9 92.3  PLT 179 177 181 206 201   Cardiac Enzymes: No results for input(s): "CKTOTAL", "CKMB", "CKMBINDEX", "TROPONINI" in the last 168 hours. BNP: Invalid input(s): "POCBNP" CBG: Recent Labs  Lab 06/25/21 0741 06/25/21 1128 06/25/21 1628 06/25/21 2101 06/26/21 0805  GLUCAP 269* 304* 272* 296* 340*   D-Dimer No results for input(s): "DDIMER" in the last 72 hours. Hgb A1c No results for input(s): "HGBA1C" in the last 72 hours. Lipid Profile No results for input(s): "CHOL", "HDL", "LDLCALC", "TRIG", "CHOLHDL", "LDLDIRECT" in the last 72 hours. Thyroid function studies No results for input(s): "TSH", "T4TOTAL", "T3FREE", "THYROIDAB" in the last 72 hours.  Invalid input(s): "FREET3" Anemia work up No results for input(s): "VITAMINB12", "FOLATE", "FERRITIN", "TIBC", "IRON", "RETICCTPCT" in the last 72 hours. Urinalysis No results found for: "COLORURINE", "APPEARANCEUR", "LABSPEC", "PHURINE", "GLUCOSEU", "HGBUR", "BILIRUBINUR", "KETONESUR", "PROTEINUR", "UROBILINOGEN", "NITRITE", "LEUKOCYTESUR" Sepsis Labs Recent Labs  Lab 06/23/21 0425 06/24/21 0427 06/25/21 0526 06/26/21 0530  WBC 19.1* 15.4* 15.3* 15.5*   Microbiology Recent Results (from the past 240 hour(s))  Culture, blood (routine x 2)     Status: None   Collection Time: 06/21/21  9:20 AM   Specimen: BLOOD  Result Value Ref Range Status   Specimen Description BLOOD BLOOD RIGHT ARM  Final   Special Requests   Final    BOTTLES DRAWN  AEROBIC AND ANAEROBIC Blood Culture results may not be optimal due to an excessive volume of blood received in culture bottles   Culture   Final    NO GROWTH 5 DAYS Performed at [facility], [address], [place], [place] [address]  Report Status 06/26/2021 FINAL  Final  SARS Coronavirus 2 by RT PCR (hospital order, performed in [facility] hospital lab) *cepheid single result test* Anterior Nasal Swab     Status: None   Collection Time: 06/21/21  9:20 AM   Specimen: Anterior Nasal Swab  Result Value Ref Range Status   SARS Coronavirus 2 by RT PCR NEGATIVE NEGATIVE Final    Comment: (NOTE) SARS-CoV-2 target nucleic acids are NOT DETECTED.  The SARS-CoV-2 RNA is generally detectable in upper and lower respiratory specimens during the acute phase of infection. The lowest concentration of SARS-CoV-2 viral copies this assay can detect is 250 copies / mL. A negative result does not preclude SARS-CoV-2 infection and should not be used as the sole basis for treatment or other patient management decisions.  A negative result may occur with improper specimen collection / handling, submission of specimen other than nasopharyngeal swab, presence of viral mutation(s) within the areas targeted by this assay, and inadequate number of viral copies (<250 copies / mL). A negative result must be combined with clinical observations, patient history, and epidemiological information.  Fact Sheet for Patients:   https://www.patel.info/  Fact Sheet for Healthcare Providers: https://hall.com/  This test is not yet approved or  cleared by the [place] FDA and has been authorized for detection and/or diagnosis of SARS-CoV-2 by FDA under an Emergency Use Authorization (EUA).  This EUA will remain in effect (meaning this test can be used) for the duration of the COVID-19 declaration under Section 564(b)(1) of the Act, 21 U.S.C. section  360bbb-3(b)(1), unless the authorization is terminated or revoked sooner.  Performed at [facility], [address] [place], [place] [address]   Culture, blood (routine x 2)     Status: None   Collection Time: 06/21/21  9:28 AM   Specimen: BLOOD  Result Value Ref Range Status   Specimen Description BLOOD RIGHT ANTECUBITAL  Final   Special Requests   Final    BOTTLES DRAWN AEROBIC AND ANAEROBIC Blood Culture adequate volume   Culture   Final    NO GROWTH 5 DAYS Performed at [facility], [address] [place],  [address]    Report Status 06/26/2021 FINAL  Final  Respiratory (~20 pathogens) panel by PCR     Status: None   Collection Time: 06/21/21 12:45 PM   Specimen: Nasopharyngeal Swab; Respiratory  Result Value Ref Range Status   Adenovirus NOT DETECTED NOT DETECTED Final   Coronavirus 229E NOT DETECTED NOT DETECTED Final    Comment: (NOTE) The Coronavirus on the Respiratory Panel, DOES NOT test for the novel  Coronavirus (2019 nCoV)    Coronavirus HKU1 NOT DETECTED NOT DETECTED Final   Coronavirus NL63 NOT DETECTED NOT DETECTED Final   Coronavirus OC43 NOT DETECTED NOT DETECTED Final   Metapneumovirus NOT DETECTED NOT DETECTED Final   Rhinovirus / Enterovirus NOT DETECTED NOT DETECTED Final   Influenza A NOT DETECTED NOT DETECTED Final   Influenza B NOT DETECTED NOT DETECTED Final   Parainfluenza Virus 1 NOT DETECTED NOT DETECTED Final   Parainfluenza Virus 2 NOT DETECTED NOT DETECTED Final   Parainfluenza Virus 3 NOT DETECTED NOT DETECTED Final   Parainfluenza Virus 4 NOT DETECTED NOT DETECTED Final   Respiratory Syncytial Virus NOT DETECTED NOT DETECTED Final   Bordetella pertussis NOT DETECTED NOT DETECTED Final   Bordetella Parapertussis NOT DETECTED NOT DETECTED Final   Chlamydophila pneumoniae NOT DETECTED NOT DETECTED Final   Mycoplasma pneumoniae NOT DETECTED  NOT DETECTED Final    Comment: Performed at [facility], [place] [address], [place], [place] [address]  MRSA Next Gen by PCR, Nasal     Status: None   Collection Time: 06/21/21 12:45 PM   Specimen: Nasal Mucosa; Nasal Swab  Result Value Ref Range Status   MRSA by PCR Next Gen NOT DETECTED NOT DETECTED Final    Comment: (NOTE) The GeneXpert MRSA Assay (FDA approved for NASAL specimens only), is one component of a comprehensive MRSA colonization surveillance program. It is not intended to diagnose MRSA infection nor to guide or monitor treatment for MRSA infections. Test performance is not FDA approved in patients less than 54 years old. Performed at [facility], [address] [place], [place] [address]      Time coordinating discharge: Over 30 minutes  SIGNED:   Wyvonnia Dusky, MD  [facility] 06/26/2021, 12:18 PM Pager   If 7PM-7AM, please contact night-coverage

## 2021-06-26 NOTE — Progress Notes (Signed)
Physical Therapy Treatment Patient Details Name: Randy Dalton MRN: [id number] DOB: 1927-11-27 Today's Date: 06/26/2021   History of Present Illness Randy Dalton is a 88 y.o. male with medical history significant of hypertension, hyperlipidemia, diabetes mellitus, GERD, hypothyroidism, OSA on CPAP, CKD-4, CHF, presents with worsening shortness of breath.  Patient is agreeable to PT with encouragement from his supportive family in the room. The patient ambulated a lap in the hallway with rolling walker with cues for proper rolling walker placement. Sp02 89-90% on room air while ambulating. Encouraged routine walking at home to maintain strength and endurance with use of rolling walker for safety and fall prevention. Recommend HHPT at discharge.   PT Comments      Recommendations for follow up therapy are one component of a multi-disciplinary discharge planning process, led by the attending physician.  Recommendations may be updated based on patient status, additional functional criteria and insurance authorization.  Follow Up Recommendations  Home health PT     Assistance Recommended at Discharge PRN  Patient can return home with the following Help with stairs or ramp for entrance;Assist for transportation   Equipment Recommendations  None recommended by PT    Recommendations for Other Services       Precautions / Restrictions Precautions Precautions: Fall Restrictions Weight Bearing Restrictions: No     Mobility  Bed Mobility Overal bed mobility: Needs Assistance Bed Mobility: Supine to Sit     Supine to sit: Supervision, HOB elevated Sit to supine: Min assist, HOB elevated   General bed mobility comments: assistance required intermittently for LE support to return to bed. increased time and effort. occasional cues for sequencing    Transfers Overall transfer level: Needs assistance Equipment used: Rolling walker (2 wheels) Transfers: Sit to/from Stand Sit to Stand:  Min guard, Supervision           General transfer comment: Min guard for the first stand and supervision for the second stand. verbal cues required with each standing bout for safe hand placement. and cues provided for increased eccentric control with sitting    Ambulation/Gait Ambulation/Gait assistance: Min guard Gait Distance (Feet): 175 Feet Assistive device: Rolling walker (2 wheels) Gait Pattern/deviations: Trunk flexed Gait velocity: decreased     General Gait Details: verbal cues and visual demonstration provided for proper rolling walker use. patient tends to stand too far from the walker and walks outside base of support of the walk with turns. he demonstrated carry over with instruction. Sp02 89-90% on room air.   Stairs             Wheelchair Mobility    Modified Rankin (Stroke Patients Only)       Balance Overall balance assessment: Needs assistance Sitting-balance support: Feet supported Sitting balance-Leahy Scale: Good     Standing balance support: Bilateral upper extremity supported, Reliant on assistive device for balance Standing balance-Leahy Scale: Fair Standing balance comment: no external support provided by therapist with rolling walker for UE support                            Cognition Arousal/Alertness: Awake/alert Behavior During Therapy: WFL for tasks assessed/performed Overall Cognitive Status: Within Functional Limits for tasks assessed                                 General Comments: patient able to follow all directions but is  very talkative and required occasional cues for re-direction and attention to task. very pleasant        Exercises      General Comments General comments (skin integrity, edema, etc.): encourgaed routine short distance ambulation using rolling walker to maintain strength and endurance.      Pertinent Vitals/Pain Pain Assessment Pain Assessment: No/denies pain    Home  Living                          Prior Function            PT Goals (current goals can now be found in the care plan section) Acute Rehab PT Goals Patient Stated Goal: to return home PT Goal Formulation: With patient Time For Goal Achievement: 07/07/21 Potential to Achieve Goals: Good Progress towards PT goals: Progressing toward goals    Frequency    Min 2X/week      PT Plan Current plan remains appropriate    Co-evaluation              AM-PAC PT "6 Clicks" Mobility   Outcome Measure  Help needed turning from your back to your side while in a flat bed without using bedrails?: None Help needed moving from lying on your back to sitting on the side of a flat bed without using bedrails?: None Help needed moving to and from a bed to a chair (including a wheelchair)?: None Help needed standing up from a chair using your arms (e.g., wheelchair or bedside chair)?: A Little Help needed to walk in hospital room?: A Little Help needed climbing 3-5 steps with a railing? : A Little 6 Click Score: 21    End of Session Equipment Utilized During Treatment: Gait belt Activity Tolerance: Patient tolerated treatment well Patient left: in bed;with call bell/phone within reach;with bed alarm set;with family/visitor present (spouse and daughter present) Nurse Communication: Mobility status PT Visit Diagnosis: Unsteadiness on feet (R26.81);Other abnormalities of gait and mobility (R26.89);Muscle weakness (generalized) (M62.81);Difficulty in walking, not elsewhere classified (R26.2)     Time: 1003-1030 PT Time Calculation (min) (ACUTE ONLY): 27 min  Charges:  $Gait Training: 8-22 mins $Therapeutic Activity: 8-22 mins                     Minna Merritts, PT, MPT    [name] 06/26/2021, 12:48 PM

## 2021-06-26 NOTE — Care Management Important Message (Signed)
Important Message  Patient Details  Name: Randy Dalton MRN: [id number] Date of Birth: Feb 28, 1927   Medicare Important Message Given:  Yes     Darius Bump Deziree Mokry 06/26/2021, 12:33 PM

## 2021-06-26 NOTE — TOC Transition Note (Signed)
Transition of Care [facility]) - CM/SW Discharge Note   Patient Details  Name: Randy Dalton MRN: [id number] Date of Birth: 1927/01/11  [facility] CM/SW Contact:  Alberteen Sam, LCSW Phone Number: 06/26/2021, 1:04 PM   Clinical Narrative:     Patient will DC to home with [facility] PT and OT through [place] [facility] informed of discharge today. Patient reported having all DME needs. No further dc needs identified.   [name], [name]   Final next level of care: Home w Home Health Services Barriers to Discharge: No Barriers Identified   Patient Goals and CMS Choice Patient states their goals for this hospitalization and ongoing recovery are:: to go home CMS Medicare.gov Compare Post Acute Care list provided to:: Patient Choice offered to / list presented to : Patient  Discharge Placement                       Discharge Plan and Services   Discharge Planning Services: CM Consult                      HH Arranged: PT, OT [facility] Agency: [place] ([facility]) Date [place]: 06/26/21 Time [place]: 0300 Representative spoke with at [place]: Corene Cornea  Social Determinants of Health ([place]) Interventions     Readmission Risk Interventions     No data to display

## 2021-06-27 ENCOUNTER — Telehealth: Payer: Self-pay

## 2021-06-27 DIAGNOSIS — G4733 Obstructive sleep apnea (adult) (pediatric): Secondary | ICD-10-CM | POA: Diagnosis not present

## 2021-06-27 DIAGNOSIS — Z7901 Long term (current) use of anticoagulants: Secondary | ICD-10-CM | POA: Diagnosis not present

## 2021-06-27 DIAGNOSIS — Z7984 Long term (current) use of oral hypoglycemic drugs: Secondary | ICD-10-CM | POA: Diagnosis not present

## 2021-06-27 DIAGNOSIS — J9601 Acute respiratory failure with hypoxia: Secondary | ICD-10-CM | POA: Diagnosis not present

## 2021-06-27 DIAGNOSIS — E669 Obesity, unspecified: Secondary | ICD-10-CM | POA: Diagnosis not present

## 2021-06-27 DIAGNOSIS — K219 Gastro-esophageal reflux disease without esophagitis: Secondary | ICD-10-CM | POA: Diagnosis not present

## 2021-06-27 DIAGNOSIS — I110 Hypertensive heart disease with heart failure: Secondary | ICD-10-CM | POA: Diagnosis not present

## 2021-06-27 DIAGNOSIS — E7800 Pure hypercholesterolemia, unspecified: Secondary | ICD-10-CM | POA: Diagnosis not present

## 2021-06-27 DIAGNOSIS — K5790 Diverticulosis of intestine, part unspecified, without perforation or abscess without bleeding: Secondary | ICD-10-CM | POA: Diagnosis not present

## 2021-06-27 DIAGNOSIS — N184 Chronic kidney disease, stage 4 (severe): Secondary | ICD-10-CM | POA: Diagnosis not present

## 2021-06-27 DIAGNOSIS — E1165 Type 2 diabetes mellitus with hyperglycemia: Secondary | ICD-10-CM | POA: Diagnosis not present

## 2021-06-27 DIAGNOSIS — E039 Hypothyroidism, unspecified: Secondary | ICD-10-CM | POA: Diagnosis not present

## 2021-06-27 DIAGNOSIS — Z6832 Body mass index (BMI) 32.0-32.9, adult: Secondary | ICD-10-CM | POA: Diagnosis not present

## 2021-06-27 DIAGNOSIS — I5033 Acute on chronic diastolic (congestive) heart failure: Secondary | ICD-10-CM | POA: Diagnosis not present

## 2021-06-27 DIAGNOSIS — Z9181 History of falling: Secondary | ICD-10-CM | POA: Diagnosis not present

## 2021-06-27 DIAGNOSIS — E1122 Type 2 diabetes mellitus with diabetic chronic kidney disease: Secondary | ICD-10-CM | POA: Diagnosis not present

## 2021-06-27 DIAGNOSIS — I4819 Other persistent atrial fibrillation: Secondary | ICD-10-CM | POA: Diagnosis not present

## 2021-06-27 NOTE — Telephone Encounter (Signed)
Transition Care Management Follow-up Telephone Call Date of discharge and from where: TCM DC [facility] 06-26-21 Dx: acute respiratory failure with hypoxia How have you been since you were released from the hospital? Doing much better  Any questions or concerns? No  Items Reviewed: Did the pt receive and understand the discharge instructions provided? Yes  Medications obtained and verified? Yes  Other? No  Any new allergies since your discharge? No  Dietary orders reviewed? Yes Do you have support at home? Yes   Home Care and Equipment/Supplies: Were home health services ordered? Yes PT If so, what is the name of the agency? [place] set up a time to come to the patient's home? yes Were any new equipment or medical supplies ordered?  No What is the name of the medical supply agency? na Were you able to get the supplies/equipment? not applicable Do you have any questions related to the use of the equipment or supplies? No  Functional Questionnaire: (I = Independent and D = Dependent) ADLs: I  Bathing/Dressing- I  Meal Prep- I  Eating- I  Maintaining continence- I-CANE  Transferring/Ambulation- I  Managing Meds- D  Follow up appointments reviewed:  PCP Hospital f/u appt confirmed? Yes  Scheduled to see Dr [name] on 07-04-21 @ 340pm. [facility] f/u appt confirmed? Yes  Scheduled to see Dr Jackelyn Hoehn on 07-16-21 @ 4pm.- Dr Humphrey Rolls on 07-19-21 at [place] Are transportation arrangements needed? No  If their condition worsens, is the pt aware to call PCP or go to the Emergency Dept.? Yes Was the patient provided with contact information for the PCP's office or ED? Yes Was to pt encouraged to call back with questions or concerns? Yes

## 2021-06-27 NOTE — Telephone Encounter (Signed)
No answer. No VM

## 2021-06-28 NOTE — Telephone Encounter (Signed)
Pt has been discharged. Will follow up re appointment scheduling.

## 2021-07-02 DIAGNOSIS — I2510 Atherosclerotic heart disease of native coronary artery without angina pectoris: Secondary | ICD-10-CM | POA: Diagnosis not present

## 2021-07-02 DIAGNOSIS — I340 Nonrheumatic mitral (valve) insufficiency: Secondary | ICD-10-CM | POA: Diagnosis not present

## 2021-07-02 DIAGNOSIS — E669 Obesity, unspecified: Secondary | ICD-10-CM | POA: Diagnosis not present

## 2021-07-02 DIAGNOSIS — I10 Essential (primary) hypertension: Secondary | ICD-10-CM | POA: Diagnosis not present

## 2021-07-02 DIAGNOSIS — E782 Mixed hyperlipidemia: Secondary | ICD-10-CM | POA: Diagnosis not present

## 2021-07-02 DIAGNOSIS — I4891 Unspecified atrial fibrillation: Secondary | ICD-10-CM | POA: Diagnosis not present

## 2021-07-02 DIAGNOSIS — G4730 Sleep apnea, unspecified: Secondary | ICD-10-CM | POA: Diagnosis not present

## 2021-07-02 NOTE — Telephone Encounter (Signed)
Attempted to schedule .  Per spouse patient not available at this time.  Will need a fu call.

## 2021-07-03 ENCOUNTER — Telehealth: Payer: Self-pay

## 2021-07-03 DIAGNOSIS — I5033 Acute on chronic diastolic (congestive) heart failure: Secondary | ICD-10-CM | POA: Diagnosis not present

## 2021-07-03 DIAGNOSIS — J9601 Acute respiratory failure with hypoxia: Secondary | ICD-10-CM | POA: Diagnosis not present

## 2021-07-03 DIAGNOSIS — N184 Chronic kidney disease, stage 4 (severe): Secondary | ICD-10-CM | POA: Diagnosis not present

## 2021-07-03 DIAGNOSIS — E1122 Type 2 diabetes mellitus with diabetic chronic kidney disease: Secondary | ICD-10-CM | POA: Diagnosis not present

## 2021-07-03 DIAGNOSIS — I110 Hypertensive heart disease with heart failure: Secondary | ICD-10-CM | POA: Diagnosis not present

## 2021-07-03 DIAGNOSIS — E1165 Type 2 diabetes mellitus with hyperglycemia: Secondary | ICD-10-CM | POA: Diagnosis not present

## 2021-07-04 ENCOUNTER — Encounter: Payer: Self-pay | Admitting: Family Medicine

## 2021-07-04 ENCOUNTER — Ambulatory Visit (INDEPENDENT_AMBULATORY_CARE_PROVIDER_SITE_OTHER): Payer: Medicare Other | Admitting: Family Medicine

## 2021-07-04 ENCOUNTER — Telehealth: Payer: Self-pay

## 2021-07-04 VITALS — BP 114/75 | HR 59 | Resp 16 | Wt 189.0 lb

## 2021-07-04 DIAGNOSIS — J209 Acute bronchitis, unspecified: Secondary | ICD-10-CM | POA: Diagnosis not present

## 2021-07-04 DIAGNOSIS — Z6832 Body mass index (BMI) 32.0-32.9, adult: Secondary | ICD-10-CM

## 2021-07-04 DIAGNOSIS — A419 Sepsis, unspecified organism: Secondary | ICD-10-CM

## 2021-07-04 DIAGNOSIS — I5033 Acute on chronic diastolic (congestive) heart failure: Secondary | ICD-10-CM

## 2021-07-04 DIAGNOSIS — N184 Chronic kidney disease, stage 4 (severe): Secondary | ICD-10-CM | POA: Diagnosis not present

## 2021-07-04 DIAGNOSIS — E6609 Other obesity due to excess calories: Secondary | ICD-10-CM

## 2021-07-04 DIAGNOSIS — E1122 Type 2 diabetes mellitus with diabetic chronic kidney disease: Secondary | ICD-10-CM

## 2021-07-04 DIAGNOSIS — G4733 Obstructive sleep apnea (adult) (pediatric): Secondary | ICD-10-CM | POA: Diagnosis not present

## 2021-07-04 DIAGNOSIS — I5032 Chronic diastolic (congestive) heart failure: Secondary | ICD-10-CM | POA: Diagnosis not present

## 2021-07-04 DIAGNOSIS — N1831 Chronic kidney disease, stage 3a: Secondary | ICD-10-CM | POA: Diagnosis not present

## 2021-07-04 LAB — BLOOD GAS, VENOUS
Acid-base deficit: 6.4 mmol/L — ABNORMAL HIGH (ref 0.0–2.0)
Bicarbonate: 21.4 mmol/L (ref 20.0–28.0)
O2 Saturation: 86.6 %
Patient temperature: 37
pCO2, Ven: 51 mmHg (ref 44–60)
pH, Ven: 7.23 — ABNORMAL LOW (ref 7.25–7.43)
pO2, Ven: 60 mmHg — ABNORMAL HIGH (ref 32–45)

## 2021-07-04 NOTE — Telephone Encounter (Signed)
Please advise 

## 2021-07-04 NOTE — Telephone Encounter (Unsigned)
Copied from [place]. Topic: Quick Communication - Home Health Verbal Orders >> Jul 04, 2021  4:19 PM Rudene Anda wrote: Caller/Agency: [facility]  Callback Number: [phone] Requesting: to extend PT  Frequency: 1x9

## 2021-07-04 NOTE — Telephone Encounter (Signed)
Copied from [place] [phone]. Topic: General - Other >> Jul 04, 2021 11:43 AM Gaetano Hawthorne wrote: Sherlynn Stalls OT from [facility] phone [phone]   Caller wanted to inform PCP, OT Eval was conducted and it determined that patient will not need OT services.

## 2021-07-04 NOTE — Progress Notes (Signed)
Established patient visit  I,[name],acting as a scribe for [name], MD.,have documented all relevant documentation on the behalf of [name], MD,as directed by  [name], MD while in the presence of [name], MD.   Patient: Randy Dalton   DOB: 03/17/1927   88 y.o. Male  MRN: [id number] Visit Date: 07/04/2021  Today's healthcare provider: [name], MD   Chief Complaint  Patient presents with   Hospitalization Follow-up   Subjective    HPI  She comes in today for transition of care visit for acute respiratory failure with hypoxia secondary to heart failure bronchitis and sepsis. He is feeling better.  Does not have much of an appetite but is getting stronger and has had no falls. CPAP is broken and he needs a new CPAP machine.  Follow up Hospitalization  Patient was admitted to [facility] on 06/21/2021 and discharged on 06/26/2021. He was treated for; Acute respiratory failure with hypoxia ([facility]). Treatment for this included; see notes in chart. Telephone follow up was done on 06/27/2021 He reports good compliance with treatment. He reports this condition is improved.  -----------------------------------------------------------------------------------------    Medications: Outpatient Medications Prior to Visit  Medication Sig   acetaminophen (TYLENOL) 650 MG CR tablet Take 650 mg by mouth every 8 (eight) hours as needed for mild pain.   amLODipine (NORVASC) 5 MG tablet Take 1 tablet (5 mg total) by mouth daily.   apixaban (ELIQUIS) 2.5 MG TABS tablet Take 1 tablet (2.5 mg total) by mouth 2 (two) times daily.   carvedilol (COREG) 6.25 MG tablet Take 1 tablet (6.25 mg total) by mouth 2 (two) times daily with a meal.   Cholecalciferol (VITAMIN D) 2000 UNITS tablet Take 2,000 Units by mouth daily.    glimepiride (AMARYL) 4 MG tablet TAKE 1 TABLET(4 MG) BY MOUTH DAILY   hydrALAZINE (APRESOLINE) 50 MG tablet Take 1 tablet (50 mg  total) by mouth every 8 (eight) hours.   levothyroxine (SYNTHROID) 175 MCG tablet TAKE 1 TABLET BY MOUTH EVERY DAY   Multiple Vitamin (MULTIVITAMIN) tablet Take 1 tablet by mouth daily.   simvastatin (ZOCOR) 20 MG tablet TAKE 1 TABLET(20 MG) BY MOUTH AT BEDTIME   torsemide (DEMADEX) 20 MG tablet Take 1 tablet (20 mg total) by mouth 2 (two) times daily.   TRADJENTA 5 MG TABS tablet TAKE 1 TABLET(5 MG) BY MOUTH DAILY   No facility-administered medications prior to visit.    Review of Systems  Constitutional:  Negative for appetite change, chills and fever.  Respiratory:  Negative for chest tightness, shortness of breath and wheezing.   Cardiovascular:  Negative for chest pain and palpitations.  Gastrointestinal:  Negative for abdominal pain, nausea and vomiting.    Last metabolic panel Lab Results  Component Value Date   GLUCOSE 194 (H) 07/04/2021   NA 135 07/04/2021   K 5.0 07/04/2021   CL 91 (L) 07/04/2021   CO2 27 07/04/2021   BUN 86 (HH) 07/04/2021   CREATININE 3.50 (H) 07/04/2021   EGFR 16 (L) 07/04/2021   CALCIUM 10.4 (H) 07/04/2021   PHOS 3.6 07/04/2021   PROT 7.2 06/21/2021   ALBUMIN 4.3 07/04/2021   LABGLOB 2.7 06/13/2020   AGRATIO 1.6 06/13/2020   BILITOT 1.7 (H) 06/21/2021   ALKPHOS 80 06/21/2021   AST 15 06/21/2021   ALT 16 06/21/2021   ANIONGAP 11 06/26/2021       Objective    BP 114/75 (  BP Location: Right Arm, Patient Position: Sitting, Cuff Size: Normal)   Pulse (!) 59   Resp 16   Wt 189 lb (85.7 kg)   SpO2 97%   BMI 32.44 kg/m  BP Readings from Last 3 Encounters:  07/04/21 114/75  06/26/21 (!) 157/85  02/21/21 (!) 167/62   Wt Readings from Last 3 Encounters:  07/04/21 189 lb (85.7 kg)  06/26/21 193 lb 2 oz (87.6 kg)  02/21/21 208 lb (94.3 kg)      Physical Exam Vitals and nursing note reviewed.  Constitutional:      Appearance: Normal appearance. He is normal weight.  HENT:     Right Ear: Tympanic membrane normal.     Left Ear:  Tympanic membrane normal.     Nose: Nose normal.     Mouth/Throat:     Mouth: Mucous membranes are moist.  Cardiovascular:     Rate and Rhythm: Normal rate and regular rhythm.     Pulses: Normal pulses.     Heart sounds: Normal heart sounds.  Pulmonary:     Effort: Pulmonary effort is normal.     Breath sounds: Normal breath sounds.  Abdominal:     General: Bowel sounds are normal.     Palpations: Abdomen is soft.  Musculoskeletal:     Cervical back: Neck supple.     Right lower leg: Edema present.     Left lower leg: Edema present.  Skin:    General: Skin is warm and dry.  Neurological:     General: No focal deficit present.     Mental Status: He is alert and oriented to person, place, and time.  Psychiatric:        Mood and Affect: Mood normal.        Behavior: Behavior normal.        Thought Content: Thought content normal.        Judgment: Judgment normal.       No results found for any visits on 07/04/21.  Assessment & Plan     1. Acute on chronic diastolic CHF (congestive heart failure) (HCC) Acute heart failure has been treated.  Continue present meds for now. He has follow-up at failure clinic in 2 weeks I will see him back in 1 week, he did discuss MOST forms for them to take home with him on that visit  2. Chronic diastolic heart failure (HCC)  - CBC w/Diff/Platelet - Renal function panel - Hemoglobin A1c  3. Sepsis, due to unspecified organism, unspecified whether acute organ dysfunction present [facility] [place] this is treated and resolved  4. Chronic kidney disease, stage 4 (severe) (HCC) Check renal panel, watch over diuresing - CBC w/Diff/Platelet - Renal function panel - Hemoglobin A1c  5. Type 2 diabetes mellitus with stage 3a chronic kidney disease, without long-term current use of insulin (HCC) Goal A1c less than 8.5-9 without hypoglycemia - CBC w/Diff/Platelet - Renal function panel - Hemoglobin A1c  6. Obstructive sleep apnea Order auto  titrating CPA  7. Acute bronchitis, unspecified organism Resolved  8. Class 1 obesity due to excess calories with serious comorbidity and body mass index (BMI) of 32.0 to 32.9 in adult    No follow-ups on file.      I, [name], MD, have reviewed all documentation for this visit. The documentation on 07/06/21 for the exam, diagnosis, procedures, and orders are all accurate and complete.    [name], MD  [facility] [phone] (phone) [phone] (fax)  [place]

## 2021-07-05 DIAGNOSIS — E1165 Type 2 diabetes mellitus with hyperglycemia: Secondary | ICD-10-CM | POA: Diagnosis not present

## 2021-07-05 DIAGNOSIS — I110 Hypertensive heart disease with heart failure: Secondary | ICD-10-CM | POA: Diagnosis not present

## 2021-07-05 DIAGNOSIS — I5033 Acute on chronic diastolic (congestive) heart failure: Secondary | ICD-10-CM | POA: Diagnosis not present

## 2021-07-05 DIAGNOSIS — J9601 Acute respiratory failure with hypoxia: Secondary | ICD-10-CM | POA: Diagnosis not present

## 2021-07-05 DIAGNOSIS — N184 Chronic kidney disease, stage 4 (severe): Secondary | ICD-10-CM | POA: Diagnosis not present

## 2021-07-05 DIAGNOSIS — E1122 Type 2 diabetes mellitus with diabetic chronic kidney disease: Secondary | ICD-10-CM | POA: Diagnosis not present

## 2021-07-05 LAB — CBC WITH DIFFERENTIAL/PLATELET
Basophils Absolute: 0 10*3/uL (ref 0.0–0.2)
Basos: 0 %
EOS (ABSOLUTE): 0.3 10*3/uL (ref 0.0–0.4)
Eos: 3 %
Hematocrit: 42.9 % (ref 37.5–51.0)
Hemoglobin: 14.2 g/dL (ref 13.0–17.7)
Immature Grans (Abs): 0.1 10*3/uL (ref 0.0–0.1)
Immature Granulocytes: 1 %
Lymphocytes Absolute: 1.7 10*3/uL (ref 0.7–3.1)
Lymphs: 16 %
MCH: 32 pg (ref 26.6–33.0)
MCHC: 33.1 g/dL (ref 31.5–35.7)
MCV: 97 fL (ref 79–97)
Monocytes Absolute: 1.2 10*3/uL — ABNORMAL HIGH (ref 0.1–0.9)
Monocytes: 11 %
Neutrophils Absolute: 7.2 10*3/uL — ABNORMAL HIGH (ref 1.4–7.0)
Neutrophils: 69 %
Platelets: 174 10*3/uL (ref 150–450)
RBC: 4.44 x10E6/uL (ref 4.14–5.80)
RDW: 13.1 % (ref 11.6–15.4)
WBC: 10.5 10*3/uL (ref 3.4–10.8)

## 2021-07-05 LAB — RENAL FUNCTION PANEL
Albumin: 4.3 g/dL (ref 3.5–4.6)
BUN/Creatinine Ratio: 25 — ABNORMAL HIGH (ref 10–24)
BUN: 86 mg/dL (ref 10–36)
CO2: 27 mmol/L (ref 20–29)
Calcium: 10.4 mg/dL — ABNORMAL HIGH (ref 8.6–10.2)
Chloride: 91 mmol/L — ABNORMAL LOW (ref 96–106)
Creatinine, Ser: 3.5 mg/dL — ABNORMAL HIGH (ref 0.76–1.27)
Glucose: 194 mg/dL — ABNORMAL HIGH (ref 70–99)
Phosphorus: 3.6 mg/dL (ref 2.8–4.1)
Potassium: 5 mmol/L (ref 3.5–5.2)
Sodium: 135 mmol/L (ref 134–144)
eGFR: 16 mL/min/{1.73_m2} — ABNORMAL LOW (ref >59)

## 2021-07-05 LAB — HEMOGLOBIN A1C
Est. average glucose Bld gHb Est-mCnc: 183 mg/dL
Hgb A1c MFr Bld: 8 % — ABNORMAL HIGH (ref 4.8–5.6)

## 2021-07-06 NOTE — Telephone Encounter (Signed)
Called and Left voicemail for ok verbal orders.

## 2021-07-11 ENCOUNTER — Ambulatory Visit (INDEPENDENT_AMBULATORY_CARE_PROVIDER_SITE_OTHER): Payer: Medicare Other | Admitting: Family Medicine

## 2021-07-11 ENCOUNTER — Encounter: Payer: Self-pay | Admitting: Family Medicine

## 2021-07-11 VITALS — BP 115/63 | HR 60 | Resp 16 | Wt 193.5 lb

## 2021-07-11 DIAGNOSIS — E1122 Type 2 diabetes mellitus with diabetic chronic kidney disease: Secondary | ICD-10-CM | POA: Diagnosis not present

## 2021-07-11 DIAGNOSIS — I5033 Acute on chronic diastolic (congestive) heart failure: Secondary | ICD-10-CM

## 2021-07-11 DIAGNOSIS — N1831 Chronic kidney disease, stage 3a: Secondary | ICD-10-CM | POA: Diagnosis not present

## 2021-07-11 DIAGNOSIS — N184 Chronic kidney disease, stage 4 (severe): Secondary | ICD-10-CM | POA: Diagnosis not present

## 2021-07-11 NOTE — Progress Notes (Signed)
I,Sulibeya S Dimas,acting as a scribe for [name], MD.,have documented all relevant documentation on the behalf of [name], MD,as directed by  [name], MD while in the presence of [name], MD.   Established patient visit   Patient: Randy Dalton   DOB: 09-Jan-1927   88 y.o. Male  MRN: [id number] Visit Date: 07/11/2021  Today's healthcare provider: [name], MD   Chief Complaint  Patient presents with   Follow-up   Subjective    HPI  Patient comes in today at my request after follow-up labs after hospitalization showed a creatinine of 3.5 and a GFR down to 16.  He is breathing well and is feeling much better.  Lower extremity edema is improved.  He is on torsemide 40 mg daily and half of a tablet in afternoon for extra swelling  Follow up for acute on chronic diastolic CHF  The patient was last seen for this 1 weeks ago. Changes made at last visit include checking labs. Stop torsemide and push fluids.  He reports excellent compliance with treatment. He feels that condition is Improved. He is not having side effects.   -----------------------------------------------------------------------------------------   Medications: Outpatient Medications Prior to Visit  Medication Sig   acetaminophen (TYLENOL) 650 MG CR tablet Take 650 mg by mouth every 8 (eight) hours as needed for mild pain.   amLODipine (NORVASC) 5 MG tablet Take 1 tablet (5 mg total) by mouth daily.   apixaban (ELIQUIS) 2.5 MG TABS tablet Take 1 tablet (2.5 mg total) by mouth 2 (two) times daily.   carvedilol (COREG) 6.25 MG tablet Take 1 tablet (6.25 mg total) by mouth 2 (two) times daily with a meal.   Cholecalciferol (VITAMIN D) 2000 UNITS tablet Take 2,000 Units by mouth daily.    glimepiride (AMARYL) 4 MG tablet TAKE 1 TABLET(4 MG) BY MOUTH DAILY   hydrALAZINE (APRESOLINE) 50 MG tablet Take 1 tablet (50 mg total) by mouth every 8 (eight) hours.    levothyroxine (SYNTHROID) 175 MCG tablet TAKE 1 TABLET BY MOUTH EVERY DAY   Multiple Vitamin (MULTIVITAMIN) tablet Take 1 tablet by mouth daily.   simvastatin (ZOCOR) 20 MG tablet TAKE 1 TABLET(20 MG) BY MOUTH AT BEDTIME   TRADJENTA 5 MG TABS tablet TAKE 1 TABLET(5 MG) BY MOUTH DAILY   torsemide (DEMADEX) 20 MG tablet Take 1 tablet (20 mg total) by mouth 2 (two) times daily. (Patient not taking: Reported on 07/11/2021)   No facility-administered medications prior to visit.    Review of Systems  Constitutional:  Negative for appetite change, chills, fatigue and fever.  Respiratory:  Negative for shortness of breath.   Cardiovascular:  Negative for chest pain and leg swelling.  Gastrointestinal:  Negative for abdominal pain, diarrhea, nausea and vomiting.       Objective    BP 115/63 (BP Location: Left Arm, Patient Position: Sitting, Cuff Size: Large)   Pulse 60   Resp 16   Wt 193 lb 8 oz (87.8 kg)   SpO2 100%   BMI 33.21 kg/m  BP Readings from Last 3 Encounters:  07/11/21 115/63  07/04/21 114/75  06/26/21 (!) 157/85   Wt Readings from Last 3 Encounters:  07/11/21 193 lb 8 oz (87.8 kg)  07/04/21 189 lb (85.7 kg)  06/26/21 193 lb 2 oz (87.6 kg)      Physical Exam Vitals and nursing note reviewed.  Constitutional:      Appearance: Normal appearance. He  is normal weight.  HENT:     Right Ear: Tympanic membrane normal.     Left Ear: Tympanic membrane normal.     Nose: Nose normal.     Mouth/Throat:     Mouth: Mucous membranes are moist.  Cardiovascular:     Rate and Rhythm: Normal rate and regular rhythm.     Pulses: Normal pulses.     Heart sounds: Normal heart sounds.  Pulmonary:     Effort: Pulmonary effort is normal.     Breath sounds: Normal breath sounds.  Abdominal:     General: Bowel sounds are normal.     Palpations: Abdomen is soft.  Musculoskeletal:     Cervical back: Neck supple.     Right lower leg: Edema present.     Left lower leg: Edema present.      Comments: Trace lower extremity edema  Skin:    General: Skin is warm and dry.  Neurological:     General: No focal deficit present.     Mental Status: He is alert and oriented to person, place, and time.  Psychiatric:        Mood and Affect: Mood normal.        Behavior: Behavior normal.        Thought Content: Thought content normal.        Judgment: Judgment normal.       No results found for any visits on 07/11/21.  Assessment & Plan     1. Acute on chronic diastolic CHF (congestive heart failure) (HCC) Clinically this is much improved.  He has appointment with Darylene Price from heart failure clinic next week. Patient has his daughter with him today.  She is very responsible and looking after her parents. At this time we will begin to have them hold the torsemide and have them weigh daily.  If he gains a couple of pounds on any day we will have him take a dose for a couple of days. - Renal Function Panel  2. Chronic kidney disease, stage 4 (severe) (HCC) We will try to take away the diuresis and see if his renal panel will improve. Fine-line between over diuresing and controlling his heart failure.  3. Type 2 diabetes mellitus with stage 3a chronic kidney disease, without long-term current use of insulin (HCC) Goal A1c less than 64 in this 88 year old   No follow-ups on file.      I, [name], MD, have reviewed all documentation for this visit. The documentation on 07/12/21 for the exam, diagnosis, procedures, and orders are all accurate and complete.    Analiyah Lechuga Cranford Mon, MD  [facility] [phone] (phone) [phone] (fax)  [place]

## 2021-07-12 DIAGNOSIS — E1165 Type 2 diabetes mellitus with hyperglycemia: Secondary | ICD-10-CM | POA: Diagnosis not present

## 2021-07-12 DIAGNOSIS — E1122 Type 2 diabetes mellitus with diabetic chronic kidney disease: Secondary | ICD-10-CM | POA: Diagnosis not present

## 2021-07-12 DIAGNOSIS — J9601 Acute respiratory failure with hypoxia: Secondary | ICD-10-CM | POA: Diagnosis not present

## 2021-07-12 DIAGNOSIS — I110 Hypertensive heart disease with heart failure: Secondary | ICD-10-CM | POA: Diagnosis not present

## 2021-07-12 DIAGNOSIS — N184 Chronic kidney disease, stage 4 (severe): Secondary | ICD-10-CM | POA: Diagnosis not present

## 2021-07-12 DIAGNOSIS — I5033 Acute on chronic diastolic (congestive) heart failure: Secondary | ICD-10-CM | POA: Diagnosis not present

## 2021-07-12 LAB — RENAL FUNCTION PANEL
Albumin: 3.9 g/dL (ref 3.5–4.6)
BUN/Creatinine Ratio: 21 (ref 10–24)
BUN: 56 mg/dL — ABNORMAL HIGH (ref 10–36)
CO2: 23 mmol/L (ref 20–29)
Calcium: 9.8 mg/dL (ref 8.6–10.2)
Chloride: 94 mmol/L — ABNORMAL LOW (ref 96–106)
Creatinine, Ser: 2.67 mg/dL — ABNORMAL HIGH (ref 0.76–1.27)
Glucose: 160 mg/dL — ABNORMAL HIGH (ref 70–99)
Phosphorus: 4.1 mg/dL (ref 2.8–4.1)
Potassium: 4.7 mmol/L (ref 3.5–5.2)
Sodium: 134 mmol/L (ref 134–144)
eGFR: 22 mL/min/{1.73_m2} — ABNORMAL LOW (ref >59)

## 2021-07-13 NOTE — Telephone Encounter (Signed)
Patient following up with cardiologist Dr Humphrey Rolls

## 2021-07-15 NOTE — Progress Notes (Unsigned)
Patient ID: Randy Dalton, male    DOB: Oct 12, 1927, 88 y.o.   MRN: [id number]  HPI  Mr Hoctor is an 88 y/o male with a history of DM, hyperlipidemia, HTN, thyroid disease, obstructive sleep apnea (CPAP) diverticulosis and chronic heart failure.   Echo report from 06/21/21 reviewed and showed an EF of 60-65% along with mildly elevated PA pressure of 44.4 mmHg, mild MR and mild/moderate TR. Echo report from 01/28/17 reviewed and showed an EF of 70% along with trivial AR, mild MR, mild AS and mildly elevated PA pressure of 40 mm Hg.   Admitted 06/21/21 due to shortness of breath. Initially placed on 4L due to hypoxia and then transitioned to bipap. Had not been using CPAP as required. Cardiology consult obtained. Initially given IV lasix with transition to oral diuretics. Weaned off bipap as well as off oxygen prior to discharge. Given IV antibiotics/prednisone due to bronchitis. Started on eliquis for persistent atrial fibrillation. Discharged after 4 days.    He presents today for a follow-up visit although hasn't been seen since 2019. He presents with a chief complaint of minimal fatigue with moderate exertion. Describes this as chronic in nature. He has associated shortness of breath along with this. She denies any dizziness, difficulty sleeping, abdominal distention, palpitations, pedal edema, chest pain, cough or weight gain.   Says that he had an echo done earlier today at cardiology office and returns later this week for the results.   Currently has diuretic on hold due to worsening renal function. Weight has been stable since diuretic has been held.   Admits that he has difficulty in following a low sodium diet. Does enjoy going out to eat.   Past Medical History:  Diagnosis Date   CHF (congestive heart failure) (HCC)    Chronic kidney disease    Diabetes mellitus without complication (HCC)    Diverticulosis    GERD (gastroesophageal reflux disease)    Hypercholesteremia    Hypertension     Hypothyroidism    Obstructive sleep apnea    CPAP   Wears dentures    partial top   Past Surgical History:  Procedure Laterality Date   BACK SURGERY     CATARACT EXTRACTION W/PHACO Left 10/24/2014   Procedure: CATARACT EXTRACTION PHACO AND INTRAOCULAR LENS PLACEMENT ([place]);  Surgeon: Ronnell Freshwater, MD;  Location: [facility];  Service: Ophthalmology;  Laterality: Left;  DIABETIC - oral meds   CATARACT EXTRACTION W/PHACO Right 06/17/2017   Procedure: CATARACT EXTRACTION PHACO AND INTRAOCULAR LENS PLACEMENT ([facility]);  Surgeon: Birder Robson, MD;  Location: [facility];  Service: Ophthalmology;  Laterality: Right;  [place] 01:43 AP% 19.7 CDE 20.48 Fluid pack lot # [id number]   COLONOSCOPY     HERNIA REPAIR     PILONIDAL CYST EXCISION     STOMACH SURGERY     Family History  Problem Relation Age of Onset   Hypertension Mother    Kidney failure Mother    Diabetes Father    Lung cancer Brother    Colon cancer Brother    Cancer Brother        skin, removed   Dementia Sister    Dementia Sister    Hypertension Sister    Social History   Tobacco Use   Smoking status: Never   Smokeless tobacco: Never  Substance Use Topics   Alcohol use: No    Alcohol/week: 0.0 standard drinks of alcohol   Allergies  Allergen Reactions   Aspirin Other (See  Comments)    Feel bad Feel bad   Codeine Nausea Only   Prior to Admission medications   Medication Sig Start Date End Date Taking? Authorizing Provider  acetaminophen (TYLENOL) 650 MG CR tablet Take 650 mg by mouth every 8 (eight) hours as needed for mild pain.   Yes [provider]  amLODipine (NORVASC) 5 MG tablet Take 1 tablet (5 mg total) by mouth daily. 06/27/21 07/27/21 Yes Wyvonnia Dusky, MD  apixaban (ELIQUIS) 2.5 MG TABS tablet Take 1 tablet (2.5 mg total) by mouth 2 (two) times daily. 06/26/21 07/26/21 Yes Wyvonnia Dusky, MD  carvedilol (COREG) 6.25 MG tablet Take 1 tablet (6.25 mg total) by mouth 2  (two) times daily with a meal. 06/26/21 07/26/21 Yes Wyvonnia Dusky, MD  Cholecalciferol (VITAMIN D) 2000 UNITS tablet Take 2,000 Units by mouth daily.  11/08/10  Yes [provider]  glimepiride (AMARYL) 4 MG tablet TAKE 1 TABLET(4 MG) BY MOUTH DAILY 05/29/21  Yes Jerrol Banana., MD  hydrALAZINE (APRESOLINE) 50 MG tablet Take 1 tablet (50 mg total) by mouth every 8 (eight) hours. 06/26/21 07/26/21 Yes Wyvonnia Dusky, MD  levothyroxine (SYNTHROID) 175 MCG tablet TAKE 1 TABLET BY MOUTH EVERY DAY 06/25/21  Yes Jerrol Banana., MD  Multiple Vitamin (MULTIVITAMIN) tablet Take 1 tablet by mouth daily.   Yes [provider]  simvastatin (ZOCOR) 20 MG tablet TAKE 1 TABLET(20 MG) BY MOUTH AT BEDTIME 03/13/21  Yes Jerrol Banana., MD  TRADJENTA 5 MG TABS tablet TAKE 1 TABLET(5 MG) BY MOUTH DAILY 04/12/21  Yes Jerrol Banana., MD  torsemide (DEMADEX) 20 MG tablet Take 1 tablet (20 mg total) by mouth 2 (two) times daily. Patient not taking: Reported on 07/11/2021 06/26/21 07/26/21  Wyvonnia Dusky, MD   Review of Systems  Constitutional:  Positive for fatigue. Negative for appetite change.  HENT:  Negative for congestion, postnasal drip and sore throat.   Eyes: Negative.   Respiratory:  Positive for shortness of breath (with moderate exertion). Negative for cough and chest tightness.   Cardiovascular:  Negative for chest pain, palpitations and leg swelling.  Gastrointestinal:  Negative for abdominal distention and abdominal pain.  Endocrine: Negative.   Genitourinary: Negative.   Musculoskeletal:  Negative for back pain and neck pain.  Skin: Negative.   Allergic/Immunologic: Negative.   Neurological:  Negative for dizziness and light-headedness.  Hematological:  Negative for adenopathy. Does not bruise/bleed easily.  Psychiatric/Behavioral:  Negative for dysphoric mood and sleep disturbance (wearing CPAP). The patient is not nervous/anxious.    Vitals:    07/16/21 1551  BP: (!) 156/96  Pulse: 65  Resp: 18  SpO2: 100%  Weight: 196 lb 2 oz (89 kg)  Height: 5\' 4"  (1.626 m)   Wt Readings from Last 3 Encounters:  07/16/21 196 lb 2 oz (89 kg)  07/11/21 193 lb 8 oz (87.8 kg)  07/04/21 189 lb (85.7 kg)   Lab Results  Component Value Date   CREATININE 2.67 (H) 07/11/2021   CREATININE 3.50 (H) 07/04/2021   CREATININE 1.92 (H) 06/26/2021   Physical Exam Vitals and nursing note reviewed.  Constitutional:      Appearance: He is well-developed.  HENT:     Head: Normocephalic and atraumatic.  Neck:     Vascular: No JVD.  Cardiovascular:     Rate and Rhythm: Normal rate and regular rhythm.  Pulmonary:     Effort: Pulmonary effort is normal.  Breath sounds: No wheezing or rales.  Abdominal:     General: There is no distension.     Palpations: Abdomen is soft.     Tenderness: There is no abdominal tenderness.  Musculoskeletal:        General: No tenderness.     Cervical back: Normal range of motion and neck supple.     Right lower leg: Edema (2+ pitting) present.     Left lower leg: Edema (2+ pitting) present.  Skin:    General: Skin is warm and dry.  Neurological:     Mental Status: He is alert and oriented to person, place, and time.  Psychiatric:        Behavior: Behavior normal.        Thought Content: Thought content normal.   Assessment & Plan:  1: Chronic heart failure with preserved ejection fraction without structural changes- - NYHA class II - euvolemic today - weighing daily  and home weight chart reviewed; home weight has been between 180-182 pounds over the last couple of weeks; reminded to call for an overnight weight gain of >2 pounds or a weekly weight gain of >5 pounds - not adding salt to his food and tries to eat low sodium but admits that it's difficult because he says that food tastes "terrible"; reviewed the importance of keeping daily sodium intake to 2000mg / day - reviewed that once a month he could eat  higher sodium foods but he still needs to be aware of sodium content and be prepared to possibly have to take his diuretic - should he develop above weight gain or worsening swelling, he's to take the diuretic - saw cardiology Humphrey Rolls) 03/06/21; had echo done earlier today and returns to get results in 3 days - encouraged to wear compression socks daily with putting them on every morning and removing them before bedtime - BNP 06/21/21 was 562.0  2: Diabetes- - home glucose today was 114 - A1c 07/04/21 was 8.0%  3: HTN- - BP mildly elevated (156/96); BP at PCP office 5 days ago was great (115/63) - saw PCP [name]) 07/11/21 and torsemide was held due to renal function; now to be used PRN  - BMP from 07/11/21 reviewed and showed sodium 134, potassium 4.7, creatinine 2.67 and GFR 22   Medication list reviewed.   Return in 5 weeks, sooner if needed.

## 2021-07-16 ENCOUNTER — Ambulatory Visit: Payer: Medicare Other | Attending: Family | Admitting: Family

## 2021-07-16 ENCOUNTER — Encounter: Payer: Self-pay | Admitting: Family

## 2021-07-16 VITALS — BP 156/96 | HR 65 | Resp 18 | Ht 64.0 in | Wt 196.1 lb

## 2021-07-16 DIAGNOSIS — G4733 Obstructive sleep apnea (adult) (pediatric): Secondary | ICD-10-CM | POA: Insufficient documentation

## 2021-07-16 DIAGNOSIS — N1831 Chronic kidney disease, stage 3a: Secondary | ICD-10-CM

## 2021-07-16 DIAGNOSIS — I130 Hypertensive heart and chronic kidney disease with heart failure and stage 1 through stage 4 chronic kidney disease, or unspecified chronic kidney disease: Secondary | ICD-10-CM | POA: Diagnosis not present

## 2021-07-16 DIAGNOSIS — N189 Chronic kidney disease, unspecified: Secondary | ICD-10-CM | POA: Diagnosis not present

## 2021-07-16 DIAGNOSIS — I5032 Chronic diastolic (congestive) heart failure: Secondary | ICD-10-CM | POA: Diagnosis not present

## 2021-07-16 DIAGNOSIS — Z9989 Dependence on other enabling machines and devices: Secondary | ICD-10-CM | POA: Diagnosis not present

## 2021-07-16 DIAGNOSIS — Z7984 Long term (current) use of oral hypoglycemic drugs: Secondary | ICD-10-CM | POA: Diagnosis not present

## 2021-07-16 DIAGNOSIS — R0602 Shortness of breath: Secondary | ICD-10-CM | POA: Diagnosis not present

## 2021-07-16 DIAGNOSIS — R5383 Other fatigue: Secondary | ICD-10-CM | POA: Insufficient documentation

## 2021-07-16 DIAGNOSIS — Z7901 Long term (current) use of anticoagulants: Secondary | ICD-10-CM | POA: Insufficient documentation

## 2021-07-16 DIAGNOSIS — E1122 Type 2 diabetes mellitus with diabetic chronic kidney disease: Secondary | ICD-10-CM | POA: Diagnosis not present

## 2021-07-16 DIAGNOSIS — I4819 Other persistent atrial fibrillation: Secondary | ICD-10-CM | POA: Diagnosis not present

## 2021-07-16 DIAGNOSIS — I10 Essential (primary) hypertension: Secondary | ICD-10-CM | POA: Diagnosis not present

## 2021-07-16 DIAGNOSIS — E785 Hyperlipidemia, unspecified: Secondary | ICD-10-CM | POA: Diagnosis not present

## 2021-07-16 DIAGNOSIS — I340 Nonrheumatic mitral (valve) insufficiency: Secondary | ICD-10-CM | POA: Diagnosis not present

## 2021-07-16 NOTE — Patient Instructions (Addendum)
Continue weighing daily and call for an overnight weight gain of 3 pounds or more or a weekly weight gain of more than 5 pounds.   If you have voicemail, please make sure your mailbox is cleaned out so that we may leave a message and please make sure to listen to any voicemails.    If you receive a satisfaction survey regarding the [facility], please take the time to fill it out. This way we can continue to provide excellent care and make any changes that need to be made.    Put the compression socks on every morning and remove them at bedtime.

## 2021-07-19 DIAGNOSIS — E782 Mixed hyperlipidemia: Secondary | ICD-10-CM | POA: Diagnosis not present

## 2021-07-19 DIAGNOSIS — I2510 Atherosclerotic heart disease of native coronary artery without angina pectoris: Secondary | ICD-10-CM | POA: Diagnosis not present

## 2021-07-19 DIAGNOSIS — R0602 Shortness of breath: Secondary | ICD-10-CM | POA: Diagnosis not present

## 2021-07-19 DIAGNOSIS — I4891 Unspecified atrial fibrillation: Secondary | ICD-10-CM | POA: Diagnosis not present

## 2021-07-19 DIAGNOSIS — I340 Nonrheumatic mitral (valve) insufficiency: Secondary | ICD-10-CM | POA: Diagnosis not present

## 2021-07-19 DIAGNOSIS — G4739 Other sleep apnea: Secondary | ICD-10-CM | POA: Diagnosis not present

## 2021-07-19 DIAGNOSIS — I10 Essential (primary) hypertension: Secondary | ICD-10-CM | POA: Diagnosis not present

## 2021-07-20 DIAGNOSIS — I5033 Acute on chronic diastolic (congestive) heart failure: Secondary | ICD-10-CM | POA: Diagnosis not present

## 2021-07-20 DIAGNOSIS — E1122 Type 2 diabetes mellitus with diabetic chronic kidney disease: Secondary | ICD-10-CM | POA: Diagnosis not present

## 2021-07-20 DIAGNOSIS — J9601 Acute respiratory failure with hypoxia: Secondary | ICD-10-CM | POA: Diagnosis not present

## 2021-07-20 DIAGNOSIS — I110 Hypertensive heart disease with heart failure: Secondary | ICD-10-CM | POA: Diagnosis not present

## 2021-07-20 DIAGNOSIS — N184 Chronic kidney disease, stage 4 (severe): Secondary | ICD-10-CM | POA: Diagnosis not present

## 2021-07-20 DIAGNOSIS — E1165 Type 2 diabetes mellitus with hyperglycemia: Secondary | ICD-10-CM | POA: Diagnosis not present

## 2021-07-26 DIAGNOSIS — I5033 Acute on chronic diastolic (congestive) heart failure: Secondary | ICD-10-CM | POA: Diagnosis not present

## 2021-07-26 DIAGNOSIS — I110 Hypertensive heart disease with heart failure: Secondary | ICD-10-CM | POA: Diagnosis not present

## 2021-07-26 DIAGNOSIS — N184 Chronic kidney disease, stage 4 (severe): Secondary | ICD-10-CM | POA: Diagnosis not present

## 2021-07-26 DIAGNOSIS — E1122 Type 2 diabetes mellitus with diabetic chronic kidney disease: Secondary | ICD-10-CM | POA: Diagnosis not present

## 2021-07-26 DIAGNOSIS — J9601 Acute respiratory failure with hypoxia: Secondary | ICD-10-CM | POA: Diagnosis not present

## 2021-07-26 DIAGNOSIS — E1165 Type 2 diabetes mellitus with hyperglycemia: Secondary | ICD-10-CM | POA: Diagnosis not present

## 2021-07-27 ENCOUNTER — Other Ambulatory Visit: Payer: Self-pay

## 2021-07-27 ENCOUNTER — Telehealth: Payer: Self-pay | Admitting: Family Medicine

## 2021-07-27 DIAGNOSIS — K5790 Diverticulosis of intestine, part unspecified, without perforation or abscess without bleeding: Secondary | ICD-10-CM | POA: Diagnosis not present

## 2021-07-27 DIAGNOSIS — E669 Obesity, unspecified: Secondary | ICD-10-CM | POA: Diagnosis not present

## 2021-07-27 DIAGNOSIS — E7800 Pure hypercholesterolemia, unspecified: Secondary | ICD-10-CM | POA: Diagnosis not present

## 2021-07-27 DIAGNOSIS — Z6832 Body mass index (BMI) 32.0-32.9, adult: Secondary | ICD-10-CM | POA: Diagnosis not present

## 2021-07-27 DIAGNOSIS — G4733 Obstructive sleep apnea (adult) (pediatric): Secondary | ICD-10-CM | POA: Diagnosis not present

## 2021-07-27 DIAGNOSIS — E1165 Type 2 diabetes mellitus with hyperglycemia: Secondary | ICD-10-CM | POA: Diagnosis not present

## 2021-07-27 DIAGNOSIS — I110 Hypertensive heart disease with heart failure: Secondary | ICD-10-CM | POA: Diagnosis not present

## 2021-07-27 DIAGNOSIS — J9601 Acute respiratory failure with hypoxia: Secondary | ICD-10-CM | POA: Diagnosis not present

## 2021-07-27 DIAGNOSIS — K219 Gastro-esophageal reflux disease without esophagitis: Secondary | ICD-10-CM | POA: Diagnosis not present

## 2021-07-27 DIAGNOSIS — N184 Chronic kidney disease, stage 4 (severe): Secondary | ICD-10-CM | POA: Diagnosis not present

## 2021-07-27 DIAGNOSIS — Z7901 Long term (current) use of anticoagulants: Secondary | ICD-10-CM | POA: Diagnosis not present

## 2021-07-27 DIAGNOSIS — Z7984 Long term (current) use of oral hypoglycemic drugs: Secondary | ICD-10-CM | POA: Diagnosis not present

## 2021-07-27 DIAGNOSIS — I5033 Acute on chronic diastolic (congestive) heart failure: Secondary | ICD-10-CM | POA: Diagnosis not present

## 2021-07-27 DIAGNOSIS — E039 Hypothyroidism, unspecified: Secondary | ICD-10-CM | POA: Diagnosis not present

## 2021-07-27 DIAGNOSIS — Z9181 History of falling: Secondary | ICD-10-CM | POA: Diagnosis not present

## 2021-07-27 DIAGNOSIS — I4819 Other persistent atrial fibrillation: Secondary | ICD-10-CM | POA: Diagnosis not present

## 2021-07-27 DIAGNOSIS — E1122 Type 2 diabetes mellitus with diabetic chronic kidney disease: Secondary | ICD-10-CM | POA: Diagnosis not present

## 2021-07-27 MED ORDER — HYDRALAZINE HCL 50 MG PO TABS: 50.0000 mg | ORAL_TABLET | Freq: Three times a day (TID) | ORAL | 3 refills | Status: DC

## 2021-07-27 NOTE — Telephone Encounter (Signed)
Pts wife is calling to speak to [facility] stating she would like a refill on hydrALAZINE (APRESOLINE) 50 MG tablet [[id number]]  ENDED that was prescribed in the hospital. Pts wife declined appt stating that the pt was just seen by Dr. [name]. Please advise [phone]

## 2021-07-31 DIAGNOSIS — J9601 Acute respiratory failure with hypoxia: Secondary | ICD-10-CM | POA: Diagnosis not present

## 2021-07-31 DIAGNOSIS — E1122 Type 2 diabetes mellitus with diabetic chronic kidney disease: Secondary | ICD-10-CM | POA: Diagnosis not present

## 2021-07-31 DIAGNOSIS — I5033 Acute on chronic diastolic (congestive) heart failure: Secondary | ICD-10-CM | POA: Diagnosis not present

## 2021-07-31 DIAGNOSIS — I110 Hypertensive heart disease with heart failure: Secondary | ICD-10-CM | POA: Diagnosis not present

## 2021-07-31 DIAGNOSIS — E1165 Type 2 diabetes mellitus with hyperglycemia: Secondary | ICD-10-CM | POA: Diagnosis not present

## 2021-07-31 DIAGNOSIS — N184 Chronic kidney disease, stage 4 (severe): Secondary | ICD-10-CM | POA: Diagnosis not present

## 2021-08-03 DIAGNOSIS — H40053 Ocular hypertension, bilateral: Secondary | ICD-10-CM | POA: Diagnosis not present

## 2021-08-03 LAB — HM DIABETES EYE EXAM

## 2021-08-08 DIAGNOSIS — N184 Chronic kidney disease, stage 4 (severe): Secondary | ICD-10-CM | POA: Diagnosis not present

## 2021-08-08 DIAGNOSIS — I110 Hypertensive heart disease with heart failure: Secondary | ICD-10-CM | POA: Diagnosis not present

## 2021-08-08 DIAGNOSIS — J9601 Acute respiratory failure with hypoxia: Secondary | ICD-10-CM | POA: Diagnosis not present

## 2021-08-08 DIAGNOSIS — I5033 Acute on chronic diastolic (congestive) heart failure: Secondary | ICD-10-CM | POA: Diagnosis not present

## 2021-08-08 DIAGNOSIS — E1165 Type 2 diabetes mellitus with hyperglycemia: Secondary | ICD-10-CM | POA: Diagnosis not present

## 2021-08-08 DIAGNOSIS — E1122 Type 2 diabetes mellitus with diabetic chronic kidney disease: Secondary | ICD-10-CM | POA: Diagnosis not present

## 2021-08-09 ENCOUNTER — Other Ambulatory Visit: Payer: Self-pay | Admitting: Family Medicine

## 2021-08-09 NOTE — Telephone Encounter (Signed)
Medication Refill - Medication: simvastatin (ZOCOR) 20 MG tablet  Has the patient contacted their pharmacy? No. Caller will reach out to the pharmacy for future refills    Preferred Pharmacy (with phone number or street name):   [facility], [address] Phone:  [phone]  Fax:  [phone]     Has the patient been seen for an appointment in the last year OR does the patient have an upcoming appointment? Yes.    Agent: Please be advised that RX refills may take up to 3 business days. We ask that you follow-up with your pharmacy.

## 2021-08-10 NOTE — Telephone Encounter (Signed)
Refilled 03/13/2021 #90 1 refill. Requested Prescriptions  Pending Prescriptions Disp Refills  . simvastatin (ZOCOR) 20 MG tablet 90 tablet 1     Cardiovascular:  Antilipid - Statins Failed - 08/09/2021 11:49 AM      Failed - Lipid Panel in normal range within the last 12 months    Cholesterol, Total  Date Value Ref Range Status  06/13/2020 151 100 - 199 mg/dL Final   LDL Chol Calc (NIH)  Date Value Ref Range Status  06/13/2020 88 0 - 99 mg/dL Final   HDL  Date Value Ref Range Status  06/13/2020 39 (L) >39 mg/dL Final   Triglycerides  Date Value Ref Range Status  06/13/2020 133 0 - 149 mg/dL Final         Passed - Patient is not pregnant      Passed - Valid encounter within last 12 months    Recent Outpatient Visits          1 month ago Acute on chronic diastolic CHF (congestive heart failure) [facility])   [facility] Jerrol Banana., MD   1 month ago Acute on chronic diastolic CHF (congestive heart failure) [facility])   [facility] Jerrol Banana., MD   5 months ago Primary osteoarthritis involving multiple joints   [facility] Jerrol Banana., MD   5 months ago Type 2 diabetes mellitus with stage 3a chronic kidney disease, without long-term current use of insulin [facility])   [facility] Jerrol Banana., MD   9 months ago Type 2 diabetes mellitus with stage 3a chronic kidney disease, without long-term current use of insulin [facility])   [facility] Jerrol Banana., MD      Future Appointments            In 1 month Jerrol Banana., MD [facility] [place]

## 2021-08-13 DIAGNOSIS — N184 Chronic kidney disease, stage 4 (severe): Secondary | ICD-10-CM | POA: Diagnosis not present

## 2021-08-13 DIAGNOSIS — I110 Hypertensive heart disease with heart failure: Secondary | ICD-10-CM | POA: Diagnosis not present

## 2021-08-13 DIAGNOSIS — E1165 Type 2 diabetes mellitus with hyperglycemia: Secondary | ICD-10-CM | POA: Diagnosis not present

## 2021-08-13 DIAGNOSIS — I5033 Acute on chronic diastolic (congestive) heart failure: Secondary | ICD-10-CM | POA: Diagnosis not present

## 2021-08-13 DIAGNOSIS — J9601 Acute respiratory failure with hypoxia: Secondary | ICD-10-CM | POA: Diagnosis not present

## 2021-08-13 DIAGNOSIS — E1122 Type 2 diabetes mellitus with diabetic chronic kidney disease: Secondary | ICD-10-CM | POA: Diagnosis not present

## 2021-08-17 ENCOUNTER — Telehealth: Payer: Self-pay | Admitting: Family Medicine

## 2021-08-17 NOTE — Telephone Encounter (Signed)
Melissa called from [facility] in regards to a form that has been faxed to the office for patient. Please contact Melissa when form is received. Best contact [phone].

## 2021-08-20 NOTE — Telephone Encounter (Signed)
Received fax and placed it in provider's basket.

## 2021-08-22 ENCOUNTER — Encounter: Payer: Self-pay | Admitting: Family

## 2021-08-22 ENCOUNTER — Ambulatory Visit: Payer: Medicare Other | Attending: Family | Admitting: Family

## 2021-08-22 VITALS — BP 150/76 | HR 73 | Resp 18 | Ht 66.0 in | Wt 188.0 lb

## 2021-08-22 DIAGNOSIS — N1831 Chronic kidney disease, stage 3a: Secondary | ICD-10-CM | POA: Diagnosis not present

## 2021-08-22 DIAGNOSIS — E079 Disorder of thyroid, unspecified: Secondary | ICD-10-CM | POA: Insufficient documentation

## 2021-08-22 DIAGNOSIS — G4733 Obstructive sleep apnea (adult) (pediatric): Secondary | ICD-10-CM | POA: Insufficient documentation

## 2021-08-22 DIAGNOSIS — I5032 Chronic diastolic (congestive) heart failure: Secondary | ICD-10-CM

## 2021-08-22 DIAGNOSIS — N184 Chronic kidney disease, stage 4 (severe): Secondary | ICD-10-CM | POA: Diagnosis not present

## 2021-08-22 DIAGNOSIS — Z7901 Long term (current) use of anticoagulants: Secondary | ICD-10-CM | POA: Insufficient documentation

## 2021-08-22 DIAGNOSIS — I10 Essential (primary) hypertension: Secondary | ICD-10-CM

## 2021-08-22 DIAGNOSIS — I130 Hypertensive heart and chronic kidney disease with heart failure and stage 1 through stage 4 chronic kidney disease, or unspecified chronic kidney disease: Secondary | ICD-10-CM | POA: Diagnosis not present

## 2021-08-22 DIAGNOSIS — I4819 Other persistent atrial fibrillation: Secondary | ICD-10-CM | POA: Diagnosis not present

## 2021-08-22 DIAGNOSIS — I110 Hypertensive heart disease with heart failure: Secondary | ICD-10-CM | POA: Diagnosis not present

## 2021-08-22 DIAGNOSIS — N189 Chronic kidney disease, unspecified: Secondary | ICD-10-CM | POA: Diagnosis not present

## 2021-08-22 DIAGNOSIS — E785 Hyperlipidemia, unspecified: Secondary | ICD-10-CM | POA: Diagnosis not present

## 2021-08-22 DIAGNOSIS — I5033 Acute on chronic diastolic (congestive) heart failure: Secondary | ICD-10-CM | POA: Diagnosis not present

## 2021-08-22 DIAGNOSIS — E1122 Type 2 diabetes mellitus with diabetic chronic kidney disease: Secondary | ICD-10-CM

## 2021-08-22 DIAGNOSIS — Z91148 Patient's other noncompliance with medication regimen for other reason: Secondary | ICD-10-CM | POA: Insufficient documentation

## 2021-08-22 DIAGNOSIS — R0602 Shortness of breath: Secondary | ICD-10-CM | POA: Insufficient documentation

## 2021-08-22 DIAGNOSIS — J9601 Acute respiratory failure with hypoxia: Secondary | ICD-10-CM | POA: Diagnosis not present

## 2021-08-22 DIAGNOSIS — K5790 Diverticulosis of intestine, part unspecified, without perforation or abscess without bleeding: Secondary | ICD-10-CM | POA: Insufficient documentation

## 2021-08-22 DIAGNOSIS — E1165 Type 2 diabetes mellitus with hyperglycemia: Secondary | ICD-10-CM | POA: Diagnosis not present

## 2021-08-22 NOTE — Progress Notes (Signed)
Patient ID: Randy Dalton, male    DOB: January 10, 1927, 88 y.o.   MRN: [id number]  HPI  Mr Forand is an 88 y/o male with a history of DM, hyperlipidemia, HTN, thyroid disease, obstructive sleep apnea (CPAP) diverticulosis and chronic heart failure.   Echo report from 06/21/21 reviewed and showed an EF of 60-65% along with mildly elevated PA pressure of 44.4 mmHg, mild MR and mild/moderate TR. Echo report from 01/28/17 reviewed and showed an EF of 70% along with trivial AR, mild MR, mild AS and mildly elevated PA pressure of 40 mm Hg.   Admitted 06/21/21 due to shortness of breath. Initially placed on 4L due to hypoxia and then transitioned to bipap. Had not been using CPAP as required. Cardiology consult obtained. Initially given IV lasix with transition to oral diuretics. Weaned off bipap as well as off oxygen prior to discharge. Given IV antibiotics/prednisone due to bronchitis. Started on eliquis for persistent atrial fibrillation. Discharged after 4 days.    He presents today for a follow-up visit with a chief complaint of minimal fatigue upon moderate exertion. Describes this as chronic in nature. He has associated shortness of breath  along with this. He denies any difficulty sleeping, dizziness, cough, chest pain, pedal edema, palpitations, abdominal distention or weight gain.   Says that he's losing weight because his food "tastes terrible" without salt. Continues to not take his torsemide. Has lab work and nephrology appointment next week.   Past Medical History:  Diagnosis Date   CHF (congestive heart failure) (HCC)    Chronic kidney disease    Diabetes mellitus without complication (HCC)    Diverticulosis    GERD (gastroesophageal reflux disease)    Hypercholesteremia    Hypertension    Hypothyroidism    Obstructive sleep apnea    CPAP   Wears dentures    partial top   Past Surgical History:  Procedure Laterality Date   BACK SURGERY     CATARACT EXTRACTION W/PHACO Left 10/24/2014    Procedure: CATARACT EXTRACTION PHACO AND INTRAOCULAR LENS PLACEMENT ([place]);  Surgeon: Ronnell Freshwater, MD;  Location: [facility];  Service: Ophthalmology;  Laterality: Left;  DIABETIC - oral meds   CATARACT EXTRACTION W/PHACO Right 06/17/2017   Procedure: CATARACT EXTRACTION PHACO AND INTRAOCULAR LENS PLACEMENT ([facility]);  Surgeon: Birder Robson, MD;  Location: [facility];  Service: Ophthalmology;  Laterality: Right;  [place] 01:43 AP% 19.7 CDE 20.48 Fluid pack lot # [id number]   COLONOSCOPY     HERNIA REPAIR     PILONIDAL CYST EXCISION     STOMACH SURGERY     Family History  Problem Relation Age of Onset   Hypertension Mother    Kidney failure Mother    Diabetes Father    Lung cancer Brother    Colon cancer Brother    Cancer Brother        skin, removed   Dementia Sister    Dementia Sister    Hypertension Sister    Social History   Tobacco Use   Smoking status: Never   Smokeless tobacco: Never  Substance Use Topics   Alcohol use: No    Alcohol/week: 0.0 standard drinks of alcohol   Allergies  Allergen Reactions   Aspirin Other (See Comments)    Feel bad Feel bad   Codeine Nausea Only   Prior to Admission medications   Medication Sig Start Date End Date Taking? Authorizing Provider  acetaminophen (TYLENOL) 650 MG CR tablet Take 650 mg by  mouth every 8 (eight) hours as needed for mild pain.   Yes [provider]  amLODipine (NORVASC) 5 MG tablet Take 1 tablet (5 mg total) by mouth daily. 06/27/21  Yes Wyvonnia Dusky, MD  apixaban (ELIQUIS) 2.5 MG TABS tablet Take 1 tablet (2.5 mg total) by mouth 2 (two) times daily. 06/26/21  Yes Wyvonnia Dusky, MD  carvedilol (COREG) 6.25 MG tablet Take 1 tablet (6.25 mg total) by mouth 2 (two) times daily with a meal. 06/26/21  Yes Wyvonnia Dusky, MD  Cholecalciferol (VITAMIN D) 2000 UNITS tablet Take 2,000 Units by mouth daily.  11/08/10  Yes [provider]  glimepiride (AMARYL) 4 MG tablet TAKE  1 TABLET(4 MG) BY MOUTH DAILY 05/29/21  Yes Jerrol Banana., MD  hydrALAZINE (APRESOLINE) 50 MG tablet Take 1 tablet (50 mg total) by mouth every 8 (eight) hours. 07/27/21 07/22/22 Yes Jerrol Banana., MD  levothyroxine (SYNTHROID) 175 MCG tablet TAKE 1 TABLET BY MOUTH EVERY DAY 06/25/21  Yes Jerrol Banana., MD  Multiple Vitamin (MULTIVITAMIN) tablet Take 1 tablet by mouth daily.   Yes [provider]  simvastatin (ZOCOR) 20 MG tablet TAKE 1 TABLET(20 MG) BY MOUTH AT BEDTIME 03/13/21  Yes Jerrol Banana., MD  TRADJENTA 5 MG TABS tablet TAKE 1 TABLET(5 MG) BY MOUTH DAILY 04/12/21  Yes Jerrol Banana., MD  torsemide (DEMADEX) 20 MG tablet Take 1 tablet (20 mg total) by mouth 2 (two) times daily. Patient not taking: Reported on 07/11/2021 06/26/21 07/26/21  Wyvonnia Dusky, MD    Review of Systems  Constitutional:  Positive for fatigue. Negative for appetite change.  HENT:  Negative for congestion, postnasal drip and sore throat.   Eyes: Negative.   Respiratory:  Positive for shortness of breath (with moderate exertion). Negative for cough and chest tightness.   Cardiovascular:  Negative for chest pain, palpitations and leg swelling.  Gastrointestinal:  Negative for abdominal distention and abdominal pain.  Endocrine: Negative.   Genitourinary: Negative.   Musculoskeletal:  Negative for back pain and neck pain.  Skin: Negative.   Allergic/Immunologic: Negative.   Neurological:  Negative for dizziness and light-headedness.  Hematological:  Negative for adenopathy. Does not bruise/bleed easily.  Psychiatric/Behavioral:  Negative for dysphoric mood and sleep disturbance (wearing CPAP). The patient is not nervous/anxious.    Vitals:   08/22/21 1404  BP: (!) 150/76  Pulse: 73  Resp: 18  SpO2: 98%  Weight: 188 lb (85.3 kg)  Height: 5\' 6"  (1.676 m)   Wt Readings from Last 3 Encounters:  08/22/21 188 lb (85.3 kg)  07/16/21 196 lb 2 oz (89 kg)  07/11/21  193 lb 8 oz (87.8 kg)   Lab Results  Component Value Date   CREATININE 2.67 (H) 07/11/2021   CREATININE 3.50 (H) 07/04/2021   CREATININE 1.92 (H) 06/26/2021   Physical Exam Vitals and nursing note reviewed.  Constitutional:      Appearance: He is well-developed.  HENT:     Head: Normocephalic and atraumatic.  Neck:     Vascular: No JVD.  Cardiovascular:     Rate and Rhythm: Normal rate and regular rhythm.  Pulmonary:     Effort: Pulmonary effort is normal.     Breath sounds: No wheezing or rales.  Abdominal:     General: There is no distension.     Palpations: Abdomen is soft.     Tenderness: There is no abdominal tenderness.  Musculoskeletal:  General: No tenderness.     Cervical back: Normal range of motion and neck supple.     Right lower leg: Edema (1+ pitting) present.     Left lower leg: Edema (1+ pitting) present.  Skin:    General: Skin is warm and dry.  Neurological:     Mental Status: He is alert and oriented to person, place, and time.  Psychiatric:        Behavior: Behavior normal.        Thought Content: Thought content normal.   Assessment & Plan:  1: Chronic heart failure with preserved ejection fraction without structural changes- - NYHA class II - euvolemic today - weighing daily; reminded to call for an overnight weight gain of >2 pounds or a weekly weight gain of >5 pounds - weight down 8 pounds from last visit here 2 months ago - not adding salt to his food and tries to eat low sodium but admits that it's difficult because he says that food tastes "terrible"; reviewed the importance of keeping daily sodium intake to 2000mg / day - has not taken his torsemide since he was last here - saw cardiology Humphrey Rolls) 03/06/21 - tried wearing compression socks but they caused his legs to hurt so bad that he couldn't stand them - BNP 06/21/21 was 562.0  2: Diabetes- - home glucose today was 81 - A1c 07/04/21 was 8.0%  3: HTN- - BP mildly elevated  (150/76) - saw PCP [name]) 07/11/21; returns 09/12/21 - BMP from 07/11/21 reviewed and showed sodium 134, potassium 4.7, creatinine 2.67 and GFR 22 - sees nephrology ([place]) next week   Medication list reviewed.   Due to HF stability, will not make a return appointment at this time. Advised patient and family that they could call back at anytime for questions or to make an appointment. They were comfortable with this plan.

## 2021-08-22 NOTE — Patient Instructions (Addendum)
Continue weighing daily and call for an overnight weight gain of 3 pounds or more or a weekly weight gain of more than 5 pounds.   If you have voicemail, please make sure your mailbox is cleaned out so that we may leave a message and please make sure to listen to any voicemails.    Call us in the future if you need [place] for anything or to make another appointment

## 2021-08-23 ENCOUNTER — Encounter: Payer: Self-pay | Admitting: *Deleted

## 2021-08-23 DIAGNOSIS — R809 Proteinuria, unspecified: Secondary | ICD-10-CM | POA: Diagnosis not present

## 2021-08-23 DIAGNOSIS — E1122 Type 2 diabetes mellitus with diabetic chronic kidney disease: Secondary | ICD-10-CM | POA: Diagnosis not present

## 2021-08-23 DIAGNOSIS — N2581 Secondary hyperparathyroidism of renal origin: Secondary | ICD-10-CM | POA: Diagnosis not present

## 2021-08-23 DIAGNOSIS — N184 Chronic kidney disease, stage 4 (severe): Secondary | ICD-10-CM | POA: Diagnosis not present

## 2021-08-23 DIAGNOSIS — I509 Heart failure, unspecified: Secondary | ICD-10-CM | POA: Diagnosis not present

## 2021-08-23 DIAGNOSIS — I701 Atherosclerosis of renal artery: Secondary | ICD-10-CM | POA: Diagnosis not present

## 2021-08-23 DIAGNOSIS — I129 Hypertensive chronic kidney disease with stage 1 through stage 4 chronic kidney disease, or unspecified chronic kidney disease: Secondary | ICD-10-CM | POA: Diagnosis not present

## 2021-08-30 DIAGNOSIS — N1832 Chronic kidney disease, stage 3b: Secondary | ICD-10-CM | POA: Diagnosis not present

## 2021-08-30 DIAGNOSIS — R809 Proteinuria, unspecified: Secondary | ICD-10-CM | POA: Diagnosis not present

## 2021-08-30 DIAGNOSIS — I10 Essential (primary) hypertension: Secondary | ICD-10-CM | POA: Diagnosis not present

## 2021-08-30 DIAGNOSIS — E1122 Type 2 diabetes mellitus with diabetic chronic kidney disease: Secondary | ICD-10-CM | POA: Diagnosis not present

## 2021-08-30 DIAGNOSIS — R600 Localized edema: Secondary | ICD-10-CM | POA: Diagnosis not present

## 2021-09-03 ENCOUNTER — Ambulatory Visit: Payer: Medicare Other | Admitting: Family Medicine

## 2021-09-12 ENCOUNTER — Ambulatory Visit (INDEPENDENT_AMBULATORY_CARE_PROVIDER_SITE_OTHER): Payer: Medicare Other | Admitting: Family Medicine

## 2021-09-12 ENCOUNTER — Encounter: Payer: Self-pay | Admitting: Family Medicine

## 2021-09-12 VITALS — BP 135/69 | HR 61 | Resp 16 | Wt 179.0 lb

## 2021-09-12 DIAGNOSIS — M25562 Pain in left knee: Secondary | ICD-10-CM | POA: Diagnosis not present

## 2021-09-12 DIAGNOSIS — I10 Essential (primary) hypertension: Secondary | ICD-10-CM

## 2021-09-12 DIAGNOSIS — M25561 Pain in right knee: Secondary | ICD-10-CM

## 2021-09-12 DIAGNOSIS — Z6832 Body mass index (BMI) 32.0-32.9, adult: Secondary | ICD-10-CM | POA: Diagnosis not present

## 2021-09-12 DIAGNOSIS — E039 Hypothyroidism, unspecified: Secondary | ICD-10-CM

## 2021-09-12 DIAGNOSIS — N184 Chronic kidney disease, stage 4 (severe): Secondary | ICD-10-CM | POA: Diagnosis not present

## 2021-09-12 DIAGNOSIS — G4733 Obstructive sleep apnea (adult) (pediatric): Secondary | ICD-10-CM

## 2021-09-12 DIAGNOSIS — E6609 Other obesity due to excess calories: Secondary | ICD-10-CM

## 2021-09-12 DIAGNOSIS — E782 Mixed hyperlipidemia: Secondary | ICD-10-CM | POA: Diagnosis not present

## 2021-09-12 DIAGNOSIS — E1122 Type 2 diabetes mellitus with diabetic chronic kidney disease: Secondary | ICD-10-CM | POA: Diagnosis not present

## 2021-09-12 DIAGNOSIS — I5032 Chronic diastolic (congestive) heart failure: Secondary | ICD-10-CM | POA: Diagnosis not present

## 2021-09-12 DIAGNOSIS — G8929 Other chronic pain: Secondary | ICD-10-CM

## 2021-09-12 DIAGNOSIS — N1831 Chronic kidney disease, stage 3a: Secondary | ICD-10-CM

## 2021-09-12 NOTE — Progress Notes (Signed)
Established patient visit  I,Randy Dalton,acting as a scribe for Randy Durie, MD.,have documented all relevant documentation on the behalf of Randy Durie, MD,as directed by  Randy Durie, MD while in the presence of Randy Durie, MD.   Patient: Randy Dalton   DOB: [birth year]   88 y.o. Male  MRN: [id number] Visit Date: 09/12/2021  Today's healthcare provider: [name], MD   Chief Complaint  Patient presents with   Follow-up   Hypertension   Diabetes   Hyperlipidemia   Subjective    HPI  Patient comes in today for follow-up.  His daughter brings him in.  He is at her since he has changed his diet.  Intentionally lost over 20 pounds.  Diabetes Mellitus Type II, follow-up  Lab Results  Component Value Date   HGBA1C 8.0 (H) 07/04/2021   HGBA1C 8.0 (A) 02/13/2021   HGBA1C 8.1 (A) 10/17/2020   Last seen for diabetes 8 weeks ago.  Management since then includes continuing the same treatment.  Home blood sugar records: fasting range: 62-133  Most Recent Eye Exam: 08/03/2021  --------------------------------------------------------------------------------------------------- Hypertension, follow-up  BP Readings from Last 3 Encounters:  09/12/21 135/69  08/22/21 (!) 150/76  07/16/21 (!) 156/96   Wt Readings from Last 3 Encounters:  09/12/21 179 lb (81.2 kg)  08/22/21 188 lb (85.3 kg)  07/16/21 196 lb 2 oz (89 kg)     He was last seen for hypertension 8 weeks ago.  Management since that visit includes; on amlodipine 5 mg and carvedilol 6.25 mg.  Outside blood pressures are 131/81.  --------------------------------------------------------------------------------------------------- Lipid/Cholesterol, follow-up  Last Lipid Panel: Lab Results  Component Value Date   CHOL 151 06/13/2020   LDLCALC 88 06/13/2020   HDL 39 (L) 06/13/2020   TRIG 133 06/13/2020    He was last seen for this 06/13/2020.  Management since that visit  includes; on simvastatin 20 mg.  Last metabolic panel Lab Results  Component Value Date   GLUCOSE 160 (H) 07/11/2021   NA 134 07/11/2021   K 4.7 07/11/2021   BUN 56 (H) 07/11/2021   CREATININE 2.67 (H) 07/11/2021   EGFR 22 (L) 07/11/2021   GFRNONAA 32 (L) 06/26/2021   CALCIUM 9.8 07/11/2021   AST 15 06/21/2021   ALT 16 06/21/2021   The ASCVD Risk score (Arnett DK, et al., 2019) failed to calculate for the following reasons:   The 2019 ASCVD risk score is only valid for ages 70 to 69  ---------------------------------------------------------------------------------------------------   Medications: Outpatient Medications Prior to Visit  Medication Sig   acetaminophen (TYLENOL) 650 MG CR tablet Take 650 mg by mouth every 8 (eight) hours as needed for mild pain.   amLODipine (NORVASC) 5 MG tablet Take 1 tablet (5 mg total) by mouth daily.   apixaban (ELIQUIS) 2.5 MG TABS tablet Take 1 tablet (2.5 mg total) by mouth 2 (two) times daily.   carvedilol (COREG) 6.25 MG tablet Take 1 tablet (6.25 mg total) by mouth 2 (two) times daily with a meal.   Cholecalciferol (VITAMIN D) 2000 UNITS tablet Take 2,000 Units by mouth daily.    glimepiride (AMARYL) 4 MG tablet TAKE 1 TABLET(4 MG) BY MOUTH DAILY   hydrALAZINE (APRESOLINE) 50 MG tablet Take 1 tablet (50 mg total) by mouth every 8 (eight) hours.   levothyroxine (SYNTHROID) 175 MCG tablet TAKE 1 TABLET BY MOUTH EVERY DAY   Multiple Vitamin (MULTIVITAMIN) tablet Take 1 tablet by mouth daily.  simvastatin (ZOCOR) 20 MG tablet TAKE 1 TABLET(20 MG) BY MOUTH AT BEDTIME   torsemide (DEMADEX) 20 MG tablet Take 1 tablet (20 mg total) by mouth 2 (two) times daily. (Patient taking differently: Take 20 mg by mouth. 3 times weekly)   TRADJENTA 5 MG TABS tablet TAKE 1 TABLET(5 MG) BY MOUTH DAILY   No facility-administered medications prior to visit.    Review of Systems  Constitutional:  Negative for appetite change, chills and fever.   Respiratory:  Negative for chest tightness, shortness of breath and wheezing.   Cardiovascular:  Negative for chest pain and palpitations.  Gastrointestinal:  Negative for abdominal pain, nausea and vomiting.    Last hemoglobin A1c Lab Results  Component Value Date   HGBA1C 8.0 (H) 07/04/2021       Objective    BP 135/69 (BP Location: Right Arm, Patient Position: Sitting, Cuff Size: Normal)   Pulse 61   Resp 16   Wt 179 lb (81.2 kg)   SpO2 99%   BMI 28.89 kg/m  BP Readings from Last 3 Encounters:  09/12/21 135/69  08/22/21 (!) 150/76  07/16/21 (!) 156/96   Wt Readings from Last 3 Encounters:  09/12/21 179 lb (81.2 kg)  08/22/21 188 lb (85.3 kg)  07/16/21 196 lb 2 oz (89 kg)      Physical Exam Vitals reviewed.  Constitutional:      Appearance: Normal appearance. He is normal weight.  HENT:     Right Ear: Tympanic membrane normal.     Left Ear: Tympanic membrane normal.     Nose: Nose normal.     Mouth/Throat:     Mouth: Mucous membranes are moist.  Cardiovascular:     Rate and Rhythm: Normal rate and regular rhythm.     Pulses: Normal pulses.     Heart sounds: Normal heart sounds.  Pulmonary:     Effort: Pulmonary effort is normal.     Breath sounds: Normal breath sounds.  Abdominal:     General: Bowel sounds are normal.     Palpations: Abdomen is soft.  Musculoskeletal:     Cervical back: Neck supple.     Right lower leg: Edema present.     Left lower leg: Edema present.     Comments: Edema  is much improved from previous visits.  Skin:    General: Skin is warm and dry.  Neurological:     General: No focal deficit present.     Mental Status: He is alert and oriented to person, place, and time.  Psychiatric:        Mood and Affect: Mood normal.        Behavior: Behavior normal.        Thought Content: Thought content normal.        Judgment: Judgment normal.       No results found for any visits on 09/12/21.  Assessment & Plan     1. Type 2  diabetes mellitus with stage 3a chronic kidney disease, without long-term current use of insulin ([place])  - Hemoglobin A1c--GET LABS DONE IN OCTOBER.  2. Chronic kidney disease, stage 4 (severe) (HCC) Patient now working on dietary changes and weight loss to help preserve renal function, avoiding nonsteroidals - Renal function panel  3. Essential (primary) hypertension Good Pressure control. - Renal function panel  4. Mixed hyperlipidemia  - Lipid panel  5. Adult hypothyroidism For euthyroid TSH - TSH  6. Class 1 obesity due to excess calories with serious  comorbidity and body mass index (BMI) of 32.0 to 32.9 in adult Patient has lost about 26 pounds  7. Obstructive sleep apnea   8. Chronic pain of both knees   9. Chronic diastolic heart failure (HCC) Clinically improved with lifestyle changes   No follow-ups on file.      I, Randy Durie, MD, have reviewed all documentation for this visit. The documentation on 09/15/21 for the exam, diagnosis, procedures, and orders are all accurate and complete.    Ramiya Delahunty Cranford Mon, MD  [facility] [phone] (phone) [phone] (fax)  [place]

## 2021-09-12 NOTE — Patient Instructions (Signed)
STOP GLIMEPIRIDE. ASK FOR LINDSEY DRUBAL OR DR. Quentin Cornwall IF NEEDED.

## 2021-09-19 ENCOUNTER — Other Ambulatory Visit: Payer: Self-pay | Admitting: Family Medicine

## 2021-09-19 DIAGNOSIS — N1831 Chronic kidney disease, stage 3a: Secondary | ICD-10-CM

## 2021-09-20 DIAGNOSIS — I340 Nonrheumatic mitral (valve) insufficiency: Secondary | ICD-10-CM | POA: Diagnosis not present

## 2021-09-20 DIAGNOSIS — E782 Mixed hyperlipidemia: Secondary | ICD-10-CM | POA: Diagnosis not present

## 2021-09-20 DIAGNOSIS — I2510 Atherosclerotic heart disease of native coronary artery without angina pectoris: Secondary | ICD-10-CM | POA: Diagnosis not present

## 2021-09-20 DIAGNOSIS — G4730 Sleep apnea, unspecified: Secondary | ICD-10-CM | POA: Diagnosis not present

## 2021-09-20 DIAGNOSIS — E669 Obesity, unspecified: Secondary | ICD-10-CM | POA: Diagnosis not present

## 2021-09-20 DIAGNOSIS — I10 Essential (primary) hypertension: Secondary | ICD-10-CM | POA: Diagnosis not present

## 2021-09-20 DIAGNOSIS — I4891 Unspecified atrial fibrillation: Secondary | ICD-10-CM | POA: Diagnosis not present

## 2021-10-23 ENCOUNTER — Other Ambulatory Visit: Payer: Self-pay

## 2021-10-23 MED ORDER — SIMVASTATIN 20 MG PO TABS: ORAL_TABLET | ORAL | 1 refills | Status: AC

## 2021-11-05 DIAGNOSIS — E782 Mixed hyperlipidemia: Secondary | ICD-10-CM | POA: Diagnosis not present

## 2021-11-05 DIAGNOSIS — N184 Chronic kidney disease, stage 4 (severe): Secondary | ICD-10-CM | POA: Diagnosis not present

## 2021-11-05 DIAGNOSIS — N1831 Chronic kidney disease, stage 3a: Secondary | ICD-10-CM | POA: Diagnosis not present

## 2021-11-05 DIAGNOSIS — E1122 Type 2 diabetes mellitus with diabetic chronic kidney disease: Secondary | ICD-10-CM | POA: Diagnosis not present

## 2021-11-05 DIAGNOSIS — E039 Hypothyroidism, unspecified: Secondary | ICD-10-CM | POA: Diagnosis not present

## 2021-11-05 DIAGNOSIS — I10 Essential (primary) hypertension: Secondary | ICD-10-CM | POA: Diagnosis not present

## 2021-11-06 ENCOUNTER — Other Ambulatory Visit: Payer: Self-pay | Admitting: Family Medicine

## 2021-11-06 LAB — RENAL FUNCTION PANEL
Albumin: 4.4 g/dL (ref 3.6–4.6)
BUN/Creatinine Ratio: 29 — ABNORMAL HIGH (ref 10–24)
BUN: 53 mg/dL — ABNORMAL HIGH (ref 10–36)
CO2: 25 mmol/L (ref 20–29)
Calcium: 10.7 mg/dL — ABNORMAL HIGH (ref 8.6–10.2)
Chloride: 100 mmol/L (ref 96–106)
Creatinine, Ser: 1.84 mg/dL — ABNORMAL HIGH (ref 0.76–1.27)
Glucose: 137 mg/dL — ABNORMAL HIGH (ref 70–99)
Phosphorus: 4 mg/dL (ref 2.8–4.1)
Potassium: 4.4 mmol/L (ref 3.5–5.2)
Sodium: 139 mmol/L (ref 134–144)
eGFR: 34 mL/min/{1.73_m2} — ABNORMAL LOW (ref >59)

## 2021-11-06 LAB — LIPID PANEL
Chol/HDL Ratio: 3.3 ratio (ref 0.0–5.0)
Cholesterol, Total: 121 mg/dL (ref 100–199)
HDL: 37 mg/dL — ABNORMAL LOW (ref >39)
LDL Chol Calc (NIH): 64 mg/dL (ref 0–99)
Triglycerides: 110 mg/dL (ref 0–149)
VLDL Cholesterol Cal: 20 mg/dL (ref 5–40)

## 2021-11-06 LAB — HEMOGLOBIN A1C
Est. average glucose Bld gHb Est-mCnc: 160 mg/dL
Hgb A1c MFr Bld: 7.2 % — ABNORMAL HIGH (ref 4.8–5.6)

## 2021-11-06 LAB — TSH: TSH: 0.023 u[IU]/mL — ABNORMAL LOW (ref 0.450–4.500)

## 2021-11-06 MED ORDER — LEVOTHYROXINE SODIUM 150 MCG PO TABS: 150.0000 ug | ORAL_TABLET | Freq: Every day | ORAL | 0 refills | Status: AC

## 2021-11-12 DIAGNOSIS — Z23 Encounter for immunization: Secondary | ICD-10-CM | POA: Diagnosis not present

## 2021-11-13 ENCOUNTER — Other Ambulatory Visit: Payer: Self-pay

## 2021-11-19 ENCOUNTER — Other Ambulatory Visit: Payer: Self-pay | Admitting: Family Medicine

## 2021-11-19 NOTE — Telephone Encounter (Unsigned)
Copied from [place] [id number]. Topic: General - Other >> Nov 19, 2021 10:55 AM Everette C wrote: Reason for CRM: Medication Refill - Medication: torsemide (DEMADEX) 20 MG tablet [[id number]]  Has the patient contacted their pharmacy? No. (Agent: If no, request that the patient contact the pharmacy for the refill. If patient does not wish to contact the pharmacy document the reason why and proceed with request.) (Agent: If yes, when and what did the pharmacy advise?)  Preferred Pharmacy (with phone number or street name): [facility] [place], [place] [address] [place] [address] Phone: [phone] Fax: [phone] Hours: Not open 24 hours   Has the patient been seen for an appointment in the last year OR does the patient have an upcoming appointment? Yes.    Agent: Please be advised that RX refills may take up to 3 business days. We ask that you follow-up with your pharmacy.

## 2021-11-20 ENCOUNTER — Telehealth: Payer: Self-pay

## 2021-11-20 MED ORDER — TORSEMIDE 20 MG PO TABS: 20.0000 mg | ORAL_TABLET | Freq: Two times a day (BID) | ORAL | 0 refills | Status: DC

## 2021-11-20 NOTE — Telephone Encounter (Signed)
Pt is transfering with Dr [name]. Wife asking for medical record release.

## 2021-11-20 NOTE — Telephone Encounter (Signed)
Requested medication (s) are due for refill today-yes  Requested medication (s) are on the active medication list -yes  Future visit scheduled -no  Last refill: 06/26/21 #60  Notes to clinic: outside provider sent for review   Requested Prescriptions  Pending Prescriptions Disp Refills   torsemide (DEMADEX) 20 MG tablet 60 tablet 0    Sig: Take 1 tablet (20 mg total) by mouth 2 (two) times daily.     Cardiovascular:  Diuretics - Loop Failed - 11/19/2021  1:13 PM      Failed - Ca in normal range and within 180 days    Calcium  Date Value Ref Range Status  11/05/2021 10.7 (H) 8.6 - 10.2 mg/dL Final         Failed - Cr in normal range and within 180 days    Creatinine, Ser  Date Value Ref Range Status  11/05/2021 1.84 (H) 0.76 - 1.27 mg/dL Final         Passed - K in normal range and within 180 days    Potassium  Date Value Ref Range Status  11/05/2021 4.4 3.5 - 5.2 mmol/L Final         Passed - Na in normal range and within 180 days    Sodium  Date Value Ref Range Status  11/05/2021 139 134 - 144 mmol/L Final         Passed - Cl in normal range and within 180 days    Chloride  Date Value Ref Range Status  11/05/2021 100 96 - 106 mmol/L Final         Passed - Mg Level in normal range and within 180 days    Magnesium  Date Value Ref Range Status  06/22/2021 2.1 1.7 - 2.4 mg/dL Final    Comment:    Performed at [facility], [address], [place], [place] [address]         Passed - Last BP in normal range    BP Readings from Last 1 Encounters:  09/12/21 135/69         Passed - Valid encounter within last 6 months    Recent Outpatient Visits           2 months ago Type 2 diabetes mellitus with stage 3a chronic kidney disease, without long-term current use of insulin ([place])   [facility] Jerrol Banana., MD   4 months ago Acute on chronic diastolic CHF (congestive heart failure) [facility])   [facility] Jerrol Banana., MD   4 months ago Acute on chronic diastolic CHF (congestive heart failure) [facility])   [facility] Jerrol Banana., MD   9 months ago Primary osteoarthritis involving multiple joints   [facility] Jerrol Banana., MD   9 months ago Type 2 diabetes mellitus with stage 3a chronic kidney disease, without long-term current use of insulin ([place])   [facility] Jerrol Banana., MD                 Requested Prescriptions  Pending Prescriptions Disp Refills   torsemide (DEMADEX) 20 MG tablet 60 tablet 0    Sig: Take 1 tablet (20 mg total) by mouth 2 (two) times daily.     Cardiovascular:  Diuretics - Loop Failed - 11/19/2021  1:13 PM      Failed - Ca in normal range and within 180 days    Calcium  Date Value  Ref Range Status  11/05/2021 10.7 (H) 8.6 - 10.2 mg/dL Final         Failed - Cr in normal range and within 180 days    Creatinine, Ser  Date Value Ref Range Status  11/05/2021 1.84 (H) 0.76 - 1.27 mg/dL Final         Passed - K in normal range and within 180 days    Potassium  Date Value Ref Range Status  11/05/2021 4.4 3.5 - 5.2 mmol/L Final         Passed - Na in normal range and within 180 days    Sodium  Date Value Ref Range Status  11/05/2021 139 134 - 144 mmol/L Final         Passed - Cl in normal range and within 180 days    Chloride  Date Value Ref Range Status  11/05/2021 100 96 - 106 mmol/L Final         Passed - Mg Level in normal range and within 180 days    Magnesium  Date Value Ref Range Status  06/22/2021 2.1 1.7 - 2.4 mg/dL Final    Comment:    Performed at [facility], [address], [place], [place] [address]         Passed - Last BP in normal range    BP Readings from Last 1 Encounters:  09/12/21 135/69         Passed - Valid encounter within last 6 months    Recent Outpatient Visits           2 months ago Type 2 diabetes mellitus with stage 3a  chronic kidney disease, without long-term current use of insulin [facility])   [facility] Jerrol Banana., MD   4 months ago Acute on chronic diastolic CHF (congestive heart failure) [facility])   [facility] Jerrol Banana., MD   4 months ago Acute on chronic diastolic CHF (congestive heart failure) [facility])   [facility] Jerrol Banana., MD   9 months ago Primary osteoarthritis involving multiple joints   [facility] Jerrol Banana., MD   9 months ago Type 2 diabetes mellitus with stage 3a chronic kidney disease, without long-term current use of insulin [facility])   [facility] Jerrol Banana., MD

## 2021-12-03 DIAGNOSIS — N1832 Chronic kidney disease, stage 3b: Secondary | ICD-10-CM | POA: Diagnosis not present

## 2021-12-03 DIAGNOSIS — R809 Proteinuria, unspecified: Secondary | ICD-10-CM | POA: Diagnosis not present

## 2021-12-03 DIAGNOSIS — E1122 Type 2 diabetes mellitus with diabetic chronic kidney disease: Secondary | ICD-10-CM | POA: Diagnosis not present

## 2021-12-03 DIAGNOSIS — I10 Essential (primary) hypertension: Secondary | ICD-10-CM | POA: Diagnosis not present

## 2021-12-03 DIAGNOSIS — R600 Localized edema: Secondary | ICD-10-CM | POA: Diagnosis not present

## 2021-12-11 ENCOUNTER — Other Ambulatory Visit: Payer: Self-pay

## 2021-12-12 LAB — COMPREHENSIVE METABOLIC PANEL
ALT: 18 IU/L (ref 0–44)
AST: 18 IU/L (ref 0–40)
Albumin/Globulin Ratio: 2.1 (ref 1.2–2.2)
Albumin: 4.6 g/dL (ref 3.6–4.6)
Alkaline Phosphatase: 118 IU/L (ref 44–121)
BUN/Creatinine Ratio: 27 — ABNORMAL HIGH (ref 10–24)
BUN: 54 mg/dL — ABNORMAL HIGH (ref 10–36)
Bilirubin Total: 0.5 mg/dL (ref 0.0–1.2)
CO2: 25 mmol/L (ref 20–29)
Calcium: 10.8 mg/dL — ABNORMAL HIGH (ref 8.6–10.2)
Chloride: 100 mmol/L (ref 96–106)
Creatinine, Ser: 2.01 mg/dL — ABNORMAL HIGH (ref 0.76–1.27)
Globulin, Total: 2.2 g/dL (ref 1.5–4.5)
Glucose: 146 mg/dL — ABNORMAL HIGH (ref 70–99)
Potassium: 4.6 mmol/L (ref 3.5–5.2)
Sodium: 142 mmol/L (ref 134–144)
Total Protein: 6.8 g/dL (ref 6.0–8.5)
eGFR: 31 mL/min/{1.73_m2} — ABNORMAL LOW (ref >59)

## 2021-12-20 DIAGNOSIS — I2510 Atherosclerotic heart disease of native coronary artery without angina pectoris: Secondary | ICD-10-CM | POA: Diagnosis not present

## 2021-12-20 DIAGNOSIS — I4891 Unspecified atrial fibrillation: Secondary | ICD-10-CM | POA: Diagnosis not present

## 2021-12-20 DIAGNOSIS — G4730 Sleep apnea, unspecified: Secondary | ICD-10-CM | POA: Diagnosis not present

## 2021-12-20 DIAGNOSIS — R0602 Shortness of breath: Secondary | ICD-10-CM | POA: Diagnosis not present

## 2021-12-20 DIAGNOSIS — E782 Mixed hyperlipidemia: Secondary | ICD-10-CM | POA: Diagnosis not present

## 2021-12-20 DIAGNOSIS — I340 Nonrheumatic mitral (valve) insufficiency: Secondary | ICD-10-CM | POA: Diagnosis not present

## 2021-12-20 DIAGNOSIS — I10 Essential (primary) hypertension: Secondary | ICD-10-CM | POA: Diagnosis not present

## 2022-01-29 DIAGNOSIS — Z87891 Personal history of nicotine dependence: Secondary | ICD-10-CM | POA: Diagnosis not present

## 2022-01-29 DIAGNOSIS — E039 Hypothyroidism, unspecified: Secondary | ICD-10-CM | POA: Diagnosis not present

## 2022-01-29 DIAGNOSIS — E1122 Type 2 diabetes mellitus with diabetic chronic kidney disease: Secondary | ICD-10-CM | POA: Diagnosis not present

## 2022-01-29 DIAGNOSIS — N184 Chronic kidney disease, stage 4 (severe): Secondary | ICD-10-CM | POA: Diagnosis not present

## 2022-01-29 DIAGNOSIS — I5032 Chronic diastolic (congestive) heart failure: Secondary | ICD-10-CM | POA: Diagnosis not present

## 2022-01-29 DIAGNOSIS — I130 Hypertensive heart and chronic kidney disease with heart failure and stage 1 through stage 4 chronic kidney disease, or unspecified chronic kidney disease: Secondary | ICD-10-CM | POA: Diagnosis not present

## 2022-02-07 ENCOUNTER — Other Ambulatory Visit: Payer: Self-pay | Admitting: Family Medicine

## 2022-02-12 ENCOUNTER — Other Ambulatory Visit: Payer: Self-pay | Admitting: Cardiovascular Disease

## 2022-02-12 DIAGNOSIS — E1122 Type 2 diabetes mellitus with diabetic chronic kidney disease: Secondary | ICD-10-CM | POA: Diagnosis not present

## 2022-02-12 DIAGNOSIS — N184 Chronic kidney disease, stage 4 (severe): Secondary | ICD-10-CM | POA: Diagnosis not present

## 2022-02-12 DIAGNOSIS — E039 Hypothyroidism, unspecified: Secondary | ICD-10-CM | POA: Diagnosis not present

## 2022-03-09 ENCOUNTER — Other Ambulatory Visit: Payer: Self-pay | Admitting: Cardiovascular Disease

## 2022-03-21 ENCOUNTER — Encounter: Payer: Self-pay | Admitting: Cardiovascular Disease

## 2022-03-21 ENCOUNTER — Other Ambulatory Visit: Payer: Self-pay

## 2022-03-21 ENCOUNTER — Ambulatory Visit (INDEPENDENT_AMBULATORY_CARE_PROVIDER_SITE_OTHER): Payer: Medicare Other | Admitting: Cardiovascular Disease

## 2022-03-21 VITALS — BP 146/78 | HR 58 | Ht 66.5 in | Wt 180.4 lb

## 2022-03-21 DIAGNOSIS — G4733 Obstructive sleep apnea (adult) (pediatric): Secondary | ICD-10-CM

## 2022-03-21 DIAGNOSIS — I5033 Acute on chronic diastolic (congestive) heart failure: Secondary | ICD-10-CM | POA: Diagnosis not present

## 2022-03-21 DIAGNOSIS — N184 Chronic kidney disease, stage 4 (severe): Secondary | ICD-10-CM

## 2022-03-21 DIAGNOSIS — E1100 Type 2 diabetes mellitus with hyperosmolarity without nonketotic hyperglycemic-hyperosmolar coma (NKHHC): Secondary | ICD-10-CM | POA: Diagnosis not present

## 2022-03-21 DIAGNOSIS — I4891 Unspecified atrial fibrillation: Secondary | ICD-10-CM | POA: Diagnosis not present

## 2022-03-21 DIAGNOSIS — I10 Essential (primary) hypertension: Secondary | ICD-10-CM

## 2022-03-21 MED ORDER — HYDRALAZINE HCL 100 MG PO TABS: 100.0000 mg | ORAL_TABLET | Freq: Two times a day (BID) | ORAL | 11 refills | Status: DC

## 2022-03-21 MED ORDER — CARVEDILOL 6.25 MG PO TABS: 6.2500 mg | ORAL_TABLET | Freq: Two times a day (BID) | ORAL | 0 refills | Status: DC

## 2022-03-21 NOTE — Progress Notes (Signed)
Cardiology Office Note   Date:  03/21/2022   ID:  Randy Dalton, DOB 1927-07-28, MRN [id number]  PCP:  Eulas Post, MD  Cardiologist:  Neoma Laming, MD      History of Present Illness: Randy Dalton is a 89 y.o. male who presents for  Chief Complaint  Patient presents with   Follow-up    3 month follow up    Feeling fine , no chest pain/SOB      Past Medical History:  Diagnosis Date   CHF (congestive heart failure) ([place])    Chronic kidney disease    Diabetes mellitus without complication ([place])    Diverticulosis    GERD (gastroesophageal reflux disease)    Hypercholesteremia    Hypertension    Hypothyroidism    Obstructive sleep apnea    CPAP   Wears dentures    partial top     Past Surgical History:  Procedure Laterality Date   BACK SURGERY     CATARACT EXTRACTION W/PHACO Left 10/24/2014   Procedure: CATARACT EXTRACTION PHACO AND INTRAOCULAR LENS PLACEMENT ([place]);  Surgeon: Ronnell Freshwater, MD;  Location: [place];  Service: Ophthalmology;  Laterality: Left;  DIABETIC - oral meds   CATARACT EXTRACTION W/PHACO Right 06/17/2017   Procedure: CATARACT EXTRACTION PHACO AND INTRAOCULAR LENS PLACEMENT ([facility]);  Surgeon: Birder Robson, MD;  Location: [facility];  Service: Ophthalmology;  Laterality: Right;  [place] 01:43 AP% 19.7 CDE 20.48 Fluid pack lot # [id number]   COLONOSCOPY     HERNIA REPAIR     PILONIDAL CYST EXCISION     STOMACH SURGERY       Current Outpatient Medications  Medication Sig Dispense Refill   acetaminophen (TYLENOL) 650 MG CR tablet Take 650 mg by mouth every 8 (eight) hours as needed for mild pain.     amLODipine (NORVASC) 5 MG tablet Take 1 tablet (5 mg total) by mouth daily. 30 tablet 0   Cholecalciferol (VITAMIN D) 2000 UNITS tablet Take 2,000 Units by mouth daily.      ELIQUIS 2.5 MG TABS tablet TAKE 1 TABLET BY MOUTH TWICE DAILY 60 tablet 0   glimepiride (AMARYL) 4 MG tablet TAKE 1 TABLET(4 MG) BY MOUTH DAILY  90 tablet 0   hydrALAZINE (APRESOLINE) 100 MG tablet Take 1 tablet (100 mg total) by mouth 2 (two) times daily. 60 tablet 11   levothyroxine (SYNTHROID) 150 MCG tablet Take 1 tablet (150 mcg total) by mouth daily. 90 tablet 0   Multiple Vitamin (MULTIVITAMIN) tablet Take 1 tablet by mouth daily.     simvastatin (ZOCOR) 20 MG tablet TAKE 1 TABLET(20 MG) BY MOUTH AT BEDTIME 90 tablet 1   torsemide (DEMADEX) 20 MG tablet Take 1 tablet (20 mg total) by mouth 2 (two) times daily. 60 tablet 0   TRADJENTA 5 MG TABS tablet TAKE 1 TABLET(5 MG) BY MOUTH DAILY 90 tablet 1   carvedilol (COREG) 6.25 MG tablet Take 1 tablet (6.25 mg total) by mouth 2 (two) times daily with a meal. 60 tablet 0   No current facility-administered medications for this visit.    Allergies:   Aspirin and Codeine    Social History:   reports that he has never smoked. He has never used smokeless tobacco. He reports that he does not drink alcohol and does not use drugs.   Family History:  family history includes Cancer in his brother; Colon cancer in his brother; Dementia in his sister and sister; Diabetes in his  father; Hypertension in his mother and sister; Kidney failure in his mother; Lung cancer in his brother.    ROS:     Review of Systems  Constitutional: Negative.   HENT: Negative.    Eyes: Negative.   Respiratory: Negative.    Gastrointestinal: Negative.   Genitourinary: Negative.   Musculoskeletal: Negative.   Skin: Negative.   Neurological: Negative.   Endo/Heme/Allergies: Negative.   Psychiatric/Behavioral: Negative.    All other systems reviewed and are negative.     All other systems are reviewed and negative.    PHYSICAL EXAM: VS:  BP (!) 146/78   Pulse (!) 58   Ht 5' 6.5" (1.689 m)   Wt 180 lb 6.4 oz (81.8 kg)   SpO2 96%   BMI 28.68 kg/m  , BMI Body mass index is 28.68 kg/m. Last weight:  Wt Readings from Last 3 Encounters:  03/21/22 180 lb 6.4 oz (81.8 kg)  09/12/21 179 lb (81.2 kg)   08/22/21 188 lb (85.3 kg)     Physical Exam Vitals reviewed.  Constitutional:      Appearance: Normal appearance. He is normal weight.  HENT:     Head: Normocephalic.     Nose: Nose normal.     Mouth/Throat:     Mouth: Mucous membranes are moist.  Eyes:     Pupils: Pupils are equal, round, and reactive to light.  Cardiovascular:     Rate and Rhythm: Normal rate and regular rhythm.     Pulses: Normal pulses.     Heart sounds: Normal heart sounds.  Pulmonary:     Effort: Pulmonary effort is normal.  Abdominal:     General: Abdomen is flat. Bowel sounds are normal.  Musculoskeletal:        General: Normal range of motion.     Cervical back: Normal range of motion.  Skin:    General: Skin is warm.  Neurological:     General: No focal deficit present.     Mental Status: He is alert.  Psychiatric:        Mood and Affect: Mood normal.       EKG:   Recent Labs: 06/21/2021: B Natriuretic Peptide 562.0 06/22/2021: Magnesium 2.1 07/04/2021: Hemoglobin 14.2; Platelets 174 11/05/2021: TSH 0.023 12/11/2021: ALT 18; BUN 54; Creatinine, Ser 2.01; Potassium 4.6; Sodium 142    Lipid Panel    Component Value Date/Time   CHOL 121 11/05/2021 1318   TRIG 110 11/05/2021 1318   HDL 37 (L) 11/05/2021 1318   CHOLHDL 3.3 11/05/2021 1318   LDLCALC 64 11/05/2021 1318      REASON FOR VISIT  Visit for: Echocardiogram/I 34.0  Sex:   Male         wt= 186    lbs.  BP=108/54  Height= 67   inches.        TESTS  Imaging: Echocardiogram:  An echocardiogram in (2-d) mode was performed and in Doppler mode with color flow velocity mapping was performed. The aortic valve cusps are abnormal 2.3   cm, flow velocity 1.90   m/s, and systolic calculated mean flow gradient 6  mmHg. Mitral valve diastolic peak flow velocity E 1.24    m/s and E/A ratio 3.0. Aortic root diameter 3.3   cm. The LVOT internal diameter 3.6 cm and flow velocity was abnormal 1.23   m/s. LV systolic dimension 2.5    cm, diastolic [id number]  cm, posterior wall thickness 1.4  cm, fractional shortening 38.8  %, and EF  69.5  %. IVS thickness 1.41   cm. LA dimension 4.2 cm. Tricuspid Valve has Moderate Regurgitation. Aortic Valve has Mild to Moderate Regurgitation. Mitral Valve has Moderate Regurgitation. Pulmonic Valve has Trace Regurgitation.     ASSESSMENT  Technically adequate study.  Normal left ventricular systolic function.  Mild left ventricular hypertrophy with GRADE 3 (restrictive physiology) diastolic dysfunction.  Normal right ventricular systolic function.  Normal right ventricular diastolic function.  Normal left ventricular wall motion.  Normal right ventricular wall motion.  Trace pulmonary regurgitation.  Moderate tricuspid regurgitation.  Severe pulmonary hypertension.  Moderate mitral regurgitation.  Mild to Moderate aortic regurgitation.  No pericardial effusion.  Mild to Moderate aortic stenosis  Severely dilated Left and Right atrium  Moderately dilated Left and Right ventricle  Moderate LVH.     THERAPY   Referring physician: Dionisio David  Sonographer: Neoma Laming.      Neoma Laming MD  Electronically signed by: Neoma Laming     Date: 07/16/2021 13:51 Other studies Reviewed: Additional studies/ records that were reviewed today include:  Review of the above records demonstrates:       No data to display            ASSESSMENT AND PLAN:    ICD-10-CM   1. Acute on chronic diastolic CHF (congestive heart failure) (HCC)  I50.33     2. Essential (primary) hypertension  I10     3. New onset atrial fibrillation (HCC)  I48.91     4. Obstructive sleep apnea  G47.33     5. Type 2 diabetes mellitus with hyperosmolarity without coma, without long-term current use of insulin (HCC)  E11.00     6. CKD (chronic kidney disease), stage IV (HCC)  N18.4        Problem List Items Addressed This Visit       Cardiovascular and Mediastinum   Essential (primary)  hypertension   Relevant Medications   hydrALAZINE (APRESOLINE) 100 MG tablet   carvedilol (COREG) 6.25 MG tablet   Acute on chronic diastolic CHF (congestive heart failure) (HCC) - Primary    Hear rate was 58 on coreg 6.25 bid and will continue that dosage instead of 12.5 bid, but will increase hydralzine to 100 bid as BP high today.      Relevant Medications   hydrALAZINE (APRESOLINE) 100 MG tablet   carvedilol (COREG) 6.25 MG tablet   New onset atrial fibrillation (HCC)   Relevant Medications   hydrALAZINE (APRESOLINE) 100 MG tablet   carvedilol (COREG) 6.25 MG tablet     Respiratory   Obstructive sleep apnea     Endocrine   Diabetes mellitus, type 2 ([place])     Genitourinary   CKD (chronic kidney disease), stage IV ([place])       Disposition:   Return in about 4 weeks (around 04/18/2022).    Total time spent: 30 minutes  Signed,  Neoma Laming, MD  03/21/2022 9:41 AM    [place]

## 2022-03-21 NOTE — Assessment & Plan Note (Signed)
Hear rate was 58 on coreg 6.25 bid and will continue that dosage instead of 12.5 bid, but will increase hydralzine to 100 bid as BP high today.

## 2022-04-08 ENCOUNTER — Other Ambulatory Visit: Payer: Self-pay | Admitting: Cardiovascular Disease

## 2022-04-10 DIAGNOSIS — R600 Localized edema: Secondary | ICD-10-CM | POA: Diagnosis not present

## 2022-04-10 DIAGNOSIS — R809 Proteinuria, unspecified: Secondary | ICD-10-CM | POA: Diagnosis not present

## 2022-04-10 DIAGNOSIS — I10 Essential (primary) hypertension: Secondary | ICD-10-CM | POA: Diagnosis not present

## 2022-04-10 DIAGNOSIS — R82998 Other abnormal findings in urine: Secondary | ICD-10-CM | POA: Diagnosis not present

## 2022-04-10 DIAGNOSIS — N1832 Chronic kidney disease, stage 3b: Secondary | ICD-10-CM | POA: Diagnosis not present

## 2022-04-10 DIAGNOSIS — E1122 Type 2 diabetes mellitus with diabetic chronic kidney disease: Secondary | ICD-10-CM | POA: Diagnosis not present

## 2022-04-17 DIAGNOSIS — R600 Localized edema: Secondary | ICD-10-CM | POA: Diagnosis not present

## 2022-04-17 DIAGNOSIS — N1832 Chronic kidney disease, stage 3b: Secondary | ICD-10-CM | POA: Diagnosis not present

## 2022-04-17 DIAGNOSIS — I10 Essential (primary) hypertension: Secondary | ICD-10-CM | POA: Diagnosis not present

## 2022-04-17 DIAGNOSIS — E1122 Type 2 diabetes mellitus with diabetic chronic kidney disease: Secondary | ICD-10-CM | POA: Diagnosis not present

## 2022-04-18 ENCOUNTER — Encounter: Payer: Self-pay | Admitting: Cardiovascular Disease

## 2022-04-18 ENCOUNTER — Ambulatory Visit (INDEPENDENT_AMBULATORY_CARE_PROVIDER_SITE_OTHER): Payer: Medicare Other | Admitting: Cardiovascular Disease

## 2022-04-18 VITALS — BP 124/60 | HR 73 | Ht 66.0 in | Wt 180.0 lb

## 2022-04-18 DIAGNOSIS — E782 Mixed hyperlipidemia: Secondary | ICD-10-CM

## 2022-04-18 DIAGNOSIS — I5033 Acute on chronic diastolic (congestive) heart failure: Secondary | ICD-10-CM

## 2022-04-18 DIAGNOSIS — I10 Essential (primary) hypertension: Secondary | ICD-10-CM

## 2022-04-18 MED ORDER — AMLODIPINE BESYLATE 5 MG PO TABS: 5.0000 mg | ORAL_TABLET | Freq: Every day | ORAL | 1 refills | Status: DC

## 2022-04-18 MED ORDER — HYDRALAZINE HCL 100 MG PO TABS: 100.0000 mg | ORAL_TABLET | Freq: Three times a day (TID) | ORAL | 11 refills | Status: DC

## 2022-04-18 NOTE — Assessment & Plan Note (Signed)
Patient reports taking hydralazine 100 mg three times a day. B/p well controlled. Will continue this dose. Instructed patient to call office if he develops dizziness.

## 2022-04-18 NOTE — Progress Notes (Signed)
Cardiology Office Note   Date:  04/18/2022   ID:  Randy Dalton, DOB [birth year], MRN [id number]  PCP:  Bosie Clos, MD  Cardiologist:  Adrian Blackwater, MD      History of Present Illness: Randy Dalton is a 89 y.o. male who presents for  Chief Complaint  Patient presents with   Follow-up    4 week follow up    Patient in office for 4 week follow up. Denies chest pain, shortness of breath, dizziness. Patient reports b/p at home within normal limits. Spouse states patient has been taking hydralazine 50 mg, 2 tablets three times a day.     Past Medical History:  Diagnosis Date   CHF (congestive heart failure)    Chronic kidney disease    Diabetes mellitus without complication    Diverticulosis    GERD (gastroesophageal reflux disease)    Hypercholesteremia    Hypertension    Hypothyroidism    Obstructive sleep apnea    CPAP   Wears dentures    partial top     Past Surgical History:  Procedure Laterality Date   BACK SURGERY     CATARACT EXTRACTION W/PHACO Left 10/24/2014   Procedure: CATARACT EXTRACTION PHACO AND INTRAOCULAR LENS PLACEMENT (IOC);  Surgeon: Sherald Hess, MD;  Location: [facility];  Service: Ophthalmology;  Laterality: Left;  DIABETIC - oral meds   CATARACT EXTRACTION W/PHACO Right 06/17/2017   Procedure: CATARACT EXTRACTION PHACO AND INTRAOCULAR LENS PLACEMENT (IOC);  Surgeon: Galen Manila, MD;  Location: [facility];  Service: Ophthalmology;  Laterality: Right;  [place] 01:43 AP% 19.7 CDE 20.48 Fluid pack lot # [id number]   COLONOSCOPY     HERNIA REPAIR     PILONIDAL CYST EXCISION     STOMACH SURGERY       Current Outpatient Medications  Medication Sig Dispense Refill   acetaminophen (TYLENOL) 650 MG CR tablet Take 650 mg by mouth every 8 (eight) hours as needed for mild pain.     carvedilol (COREG) 6.25 MG tablet Take 1 tablet (6.25 mg total) by mouth 2 (two) times daily with a meal. 60 tablet 0   Cholecalciferol  (VITAMIN D) 2000 UNITS tablet Take 2,000 Units by mouth daily.      ELIQUIS 2.5 MG TABS tablet TAKE 1 TABLET BY MOUTH TWICE DAILY 60 tablet 0   glimepiride (AMARYL) 4 MG tablet TAKE 1 TABLET(4 MG) BY MOUTH DAILY 90 tablet 0   levothyroxine (SYNTHROID) 150 MCG tablet Take 1 tablet (150 mcg total) by mouth daily. 90 tablet 0   Multiple Vitamin (MULTIVITAMIN) tablet Take 1 tablet by mouth daily.     simvastatin (ZOCOR) 20 MG tablet TAKE 1 TABLET(20 MG) BY MOUTH AT BEDTIME 90 tablet 1   TRADJENTA 5 MG TABS tablet TAKE 1 TABLET(5 MG) BY MOUTH DAILY 90 tablet 1   amLODipine (NORVASC) 5 MG tablet Take 1 tablet (5 mg total) by mouth daily. 90 tablet 1   hydrALAZINE (APRESOLINE) 100 MG tablet Take 1 tablet (100 mg total) by mouth 3 (three) times daily. 90 tablet 11   torsemide (DEMADEX) 20 MG tablet Take 1 tablet (20 mg total) by mouth 2 (two) times daily. 60 tablet 0   No current facility-administered medications for this visit.    Allergies:   Aspirin and Codeine    Social History:   reports that he has never smoked. He has never used smokeless tobacco. He reports that he does not drink alcohol and does  not use drugs.   Family History:  family history includes Cancer in his brother; Colon cancer in his brother; Dementia in his sister and sister; Diabetes in his father; Hypertension in his mother and sister; Kidney failure in his mother; Lung cancer in his brother.    ROS:     Review of Systems  Constitutional: Negative.   HENT: Negative.    Eyes: Negative.   Respiratory: Negative.    Cardiovascular: Negative.   Gastrointestinal: Negative.   Genitourinary: Negative.   Musculoskeletal: Negative.   Skin: Negative.   Neurological: Negative.   Endo/Heme/Allergies: Negative.   Psychiatric/Behavioral: Negative.    All other systems reviewed and are negative.   All other systems are reviewed and negative.   PHYSICAL EXAM: VS:  BP 124/60 (BP Location: Right Arm, Patient Position: Sitting,  Cuff Size: Large)   Pulse 73   Ht 5\' 6"  (1.676 m)   Wt 180 lb (81.6 kg)   SpO2 95%   BMI 29.05 kg/m  , BMI Body mass index is 29.05 kg/m. Last weight:  Wt Readings from Last 3 Encounters:  04/18/22 180 lb (81.6 kg)  03/21/22 180 lb 6.4 oz (81.8 kg)  09/12/21 179 lb (81.2 kg)    Physical Exam Vitals reviewed.  Constitutional:      Appearance: Normal appearance. He is normal weight.  HENT:     Head: Normocephalic.     Nose: Nose normal.     Mouth/Throat:     Mouth: Mucous membranes are moist.  Eyes:     Pupils: Pupils are equal, round, and reactive to light.  Cardiovascular:     Rate and Rhythm: Normal rate and regular rhythm.     Pulses: Normal pulses.     Heart sounds: Normal heart sounds.  Pulmonary:     Effort: Pulmonary effort is normal.  Abdominal:     General: Abdomen is flat. Bowel sounds are normal.  Musculoskeletal:        General: Normal range of motion.     Cervical back: Normal range of motion.  Skin:    General: Skin is warm.  Neurological:     General: No focal deficit present.     Mental Status: He is alert.  Psychiatric:        Mood and Affect: Mood normal.     EKG: none today  Recent Labs: 06/21/2021: B Natriuretic Peptide 562.0 06/22/2021: Magnesium 2.1 07/04/2021: Hemoglobin 14.2; Platelets 174 11/05/2021: TSH 0.023 12/11/2021: ALT 18; BUN 54; Creatinine, Ser 2.01; Potassium 4.6; Sodium 142    Lipid Panel    Component Value Date/Time   CHOL 121 11/05/2021 1318   TRIG 110 11/05/2021 1318   HDL 37 (L) 11/05/2021 1318   CHOLHDL 3.3 11/05/2021 1318   LDLCALC 64 11/05/2021 1318     Other studies Reviewed: none today   ASSESSMENT AND PLAN:    ICD-10-CM   1. Essential (primary) hypertension  I10 amLODipine (NORVASC) 5 MG tablet    2. Acute on chronic diastolic CHF (congestive heart failure)  I50.33     3. Mixed hyperlipidemia  E78.2        Problem List Items Addressed This Visit       Cardiovascular and Mediastinum   Essential  (primary) hypertension - Primary    Patient reports taking hydralazine 100 mg three times a day. B/p well controlled. Will continue this dose. Instructed patient to call office if he develops dizziness.       Relevant Medications   amLODipine (  NORVASC) 5 MG tablet   hydrALAZINE (APRESOLINE) 100 MG tablet   Acute on chronic diastolic CHF (congestive heart failure)   Relevant Medications   amLODipine (NORVASC) 5 MG tablet   hydrALAZINE (APRESOLINE) 100 MG tablet     Other   HLD (hyperlipidemia)   Relevant Medications   amLODipine (NORVASC) 5 MG tablet   hydrALAZINE (APRESOLINE) 100 MG tablet     Disposition:   Return in about 4 weeks (around 05/16/2022).    Total time spent: 30 minutes  Signed,  Adrian BlackwaterShaukat Mckennah Kretchmer, MD  04/18/2022 9:54 AM    [facility]

## 2022-05-08 ENCOUNTER — Other Ambulatory Visit: Payer: Self-pay | Admitting: Cardiovascular Disease

## 2022-05-16 ENCOUNTER — Ambulatory Visit: Payer: Medicare Other | Admitting: Cardiovascular Disease

## 2022-05-31 DIAGNOSIS — I5032 Chronic diastolic (congestive) heart failure: Secondary | ICD-10-CM | POA: Diagnosis not present

## 2022-05-31 DIAGNOSIS — E1122 Type 2 diabetes mellitus with diabetic chronic kidney disease: Secondary | ICD-10-CM | POA: Diagnosis not present

## 2022-05-31 DIAGNOSIS — E7800 Pure hypercholesterolemia, unspecified: Secondary | ICD-10-CM | POA: Diagnosis not present

## 2022-05-31 DIAGNOSIS — N184 Chronic kidney disease, stage 4 (severe): Secondary | ICD-10-CM | POA: Diagnosis not present

## 2022-05-31 DIAGNOSIS — Z87891 Personal history of nicotine dependence: Secondary | ICD-10-CM | POA: Diagnosis not present

## 2022-05-31 DIAGNOSIS — E039 Hypothyroidism, unspecified: Secondary | ICD-10-CM | POA: Diagnosis not present

## 2022-05-31 DIAGNOSIS — I130 Hypertensive heart and chronic kidney disease with heart failure and stage 1 through stage 4 chronic kidney disease, or unspecified chronic kidney disease: Secondary | ICD-10-CM | POA: Diagnosis not present

## 2022-06-07 ENCOUNTER — Other Ambulatory Visit: Payer: Self-pay | Admitting: Cardiovascular Disease

## 2022-06-29 ENCOUNTER — Other Ambulatory Visit: Payer: Self-pay | Admitting: Cardiovascular Disease

## 2022-07-29 ENCOUNTER — Other Ambulatory Visit: Payer: Self-pay | Admitting: Cardiovascular Disease

## 2022-08-01 ENCOUNTER — Other Ambulatory Visit: Payer: Self-pay | Admitting: Cardiovascular Disease

## 2022-08-02 ENCOUNTER — Other Ambulatory Visit: Payer: Self-pay | Admitting: Cardiovascular Disease

## 2022-08-02 DIAGNOSIS — E119 Type 2 diabetes mellitus without complications: Secondary | ICD-10-CM | POA: Diagnosis not present

## 2022-08-02 DIAGNOSIS — H04123 Dry eye syndrome of bilateral lacrimal glands: Secondary | ICD-10-CM | POA: Diagnosis not present

## 2022-08-02 DIAGNOSIS — H40053 Ocular hypertension, bilateral: Secondary | ICD-10-CM | POA: Diagnosis not present

## 2022-08-02 DIAGNOSIS — Z961 Presence of intraocular lens: Secondary | ICD-10-CM | POA: Diagnosis not present

## 2022-08-08 ENCOUNTER — Other Ambulatory Visit: Payer: Self-pay | Admitting: Cardiovascular Disease

## 2022-08-13 ENCOUNTER — Other Ambulatory Visit: Payer: Self-pay | Admitting: Cardiovascular Disease

## 2022-09-08 ENCOUNTER — Other Ambulatory Visit: Payer: Self-pay | Admitting: Cardiovascular Disease

## 2022-09-17 ENCOUNTER — Other Ambulatory Visit: Payer: Self-pay | Admitting: Cardiovascular Disease

## 2022-09-20 ENCOUNTER — Other Ambulatory Visit: Payer: Self-pay | Admitting: Cardiovascular Disease

## 2022-09-24 ENCOUNTER — Other Ambulatory Visit: Payer: Self-pay | Admitting: Cardiovascular Disease

## 2022-10-01 ENCOUNTER — Other Ambulatory Visit: Payer: Self-pay | Admitting: Cardiovascular Disease

## 2022-10-01 DIAGNOSIS — I10 Essential (primary) hypertension: Secondary | ICD-10-CM

## 2022-10-03 DIAGNOSIS — I130 Hypertensive heart and chronic kidney disease with heart failure and stage 1 through stage 4 chronic kidney disease, or unspecified chronic kidney disease: Secondary | ICD-10-CM | POA: Diagnosis not present

## 2022-10-03 DIAGNOSIS — Z87891 Personal history of nicotine dependence: Secondary | ICD-10-CM | POA: Diagnosis not present

## 2022-10-03 DIAGNOSIS — E039 Hypothyroidism, unspecified: Secondary | ICD-10-CM | POA: Diagnosis not present

## 2022-10-03 DIAGNOSIS — E7800 Pure hypercholesterolemia, unspecified: Secondary | ICD-10-CM | POA: Diagnosis not present

## 2022-10-03 DIAGNOSIS — I10 Essential (primary) hypertension: Secondary | ICD-10-CM | POA: Diagnosis not present

## 2022-10-03 DIAGNOSIS — G4733 Obstructive sleep apnea (adult) (pediatric): Secondary | ICD-10-CM | POA: Diagnosis not present

## 2022-10-03 DIAGNOSIS — E1122 Type 2 diabetes mellitus with diabetic chronic kidney disease: Secondary | ICD-10-CM | POA: Diagnosis not present

## 2022-10-03 DIAGNOSIS — Z23 Encounter for immunization: Secondary | ICD-10-CM | POA: Diagnosis not present

## 2022-10-03 DIAGNOSIS — I5032 Chronic diastolic (congestive) heart failure: Secondary | ICD-10-CM | POA: Diagnosis not present

## 2022-10-03 DIAGNOSIS — I4891 Unspecified atrial fibrillation: Secondary | ICD-10-CM | POA: Diagnosis not present

## 2022-10-03 DIAGNOSIS — N184 Chronic kidney disease, stage 4 (severe): Secondary | ICD-10-CM | POA: Diagnosis not present

## 2022-10-14 ENCOUNTER — Other Ambulatory Visit: Payer: Self-pay | Admitting: Cardiovascular Disease

## 2022-10-14 DIAGNOSIS — I10 Essential (primary) hypertension: Secondary | ICD-10-CM

## 2022-10-23 ENCOUNTER — Other Ambulatory Visit: Payer: Self-pay | Admitting: Cardiovascular Disease

## 2022-11-07 ENCOUNTER — Other Ambulatory Visit: Payer: Self-pay | Admitting: Cardiovascular Disease

## 2022-12-27 ENCOUNTER — Other Ambulatory Visit: Payer: Self-pay | Admitting: Cardiovascular Disease

## 2023-01-13 ENCOUNTER — Other Ambulatory Visit: Payer: Self-pay | Admitting: Cardiovascular Disease

## 2023-01-13 DIAGNOSIS — I10 Essential (primary) hypertension: Secondary | ICD-10-CM

## 2023-02-10 ENCOUNTER — Other Ambulatory Visit: Payer: Self-pay | Admitting: Cardiovascular Disease

## 2023-02-18 IMAGING — CR DG KNEE COMPLETE 4+V*R*
1 series · 4 of 4 positions shown · non-contrast
Comparison: None.

CLINICAL DATA: Bilateral knee pain.  Anterior pain.

EXAM:
LEFT KNEE - COMPLETE 4+ VIEW; RIGHT KNEE - COMPLETE 4+ VIEW

[Series 1: dg knee complete 4 views right · 0.14mm/px · 4 of 4 slices shown]
[im 1/4]
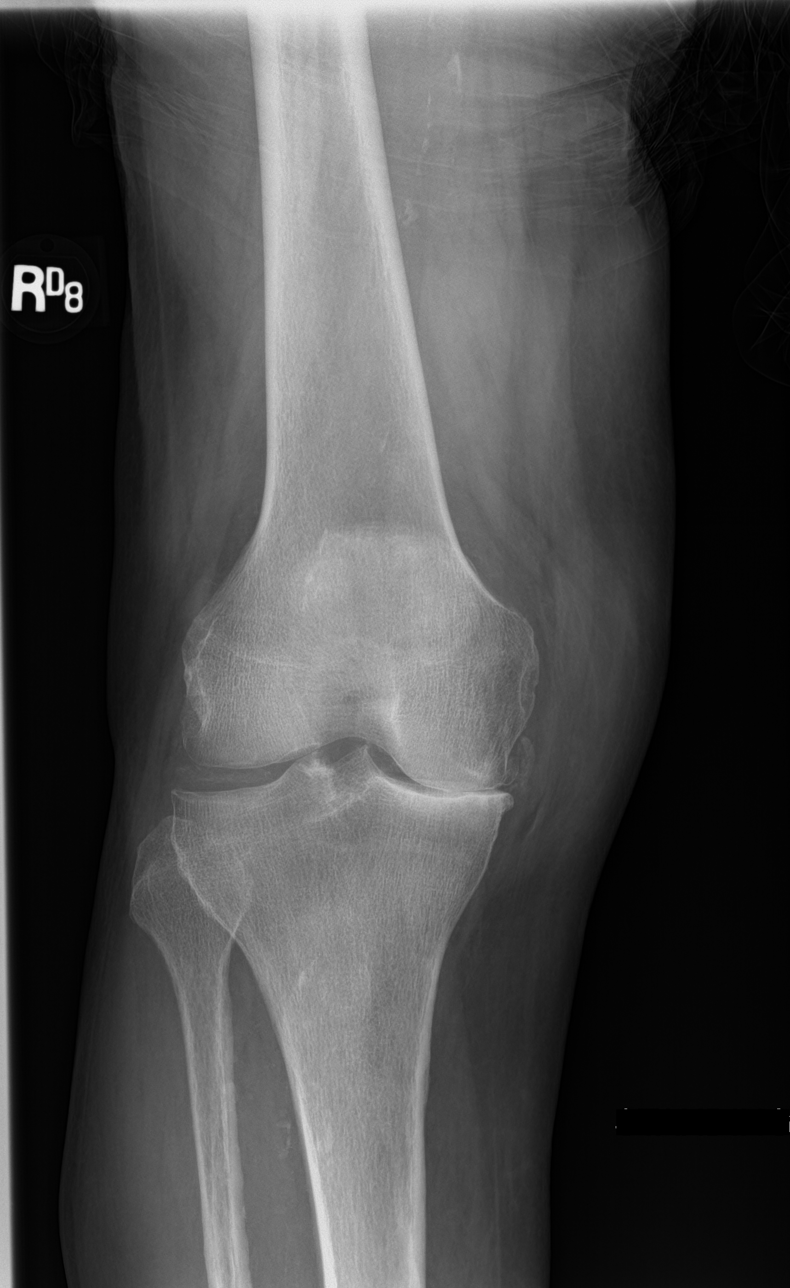
[im 2/4]
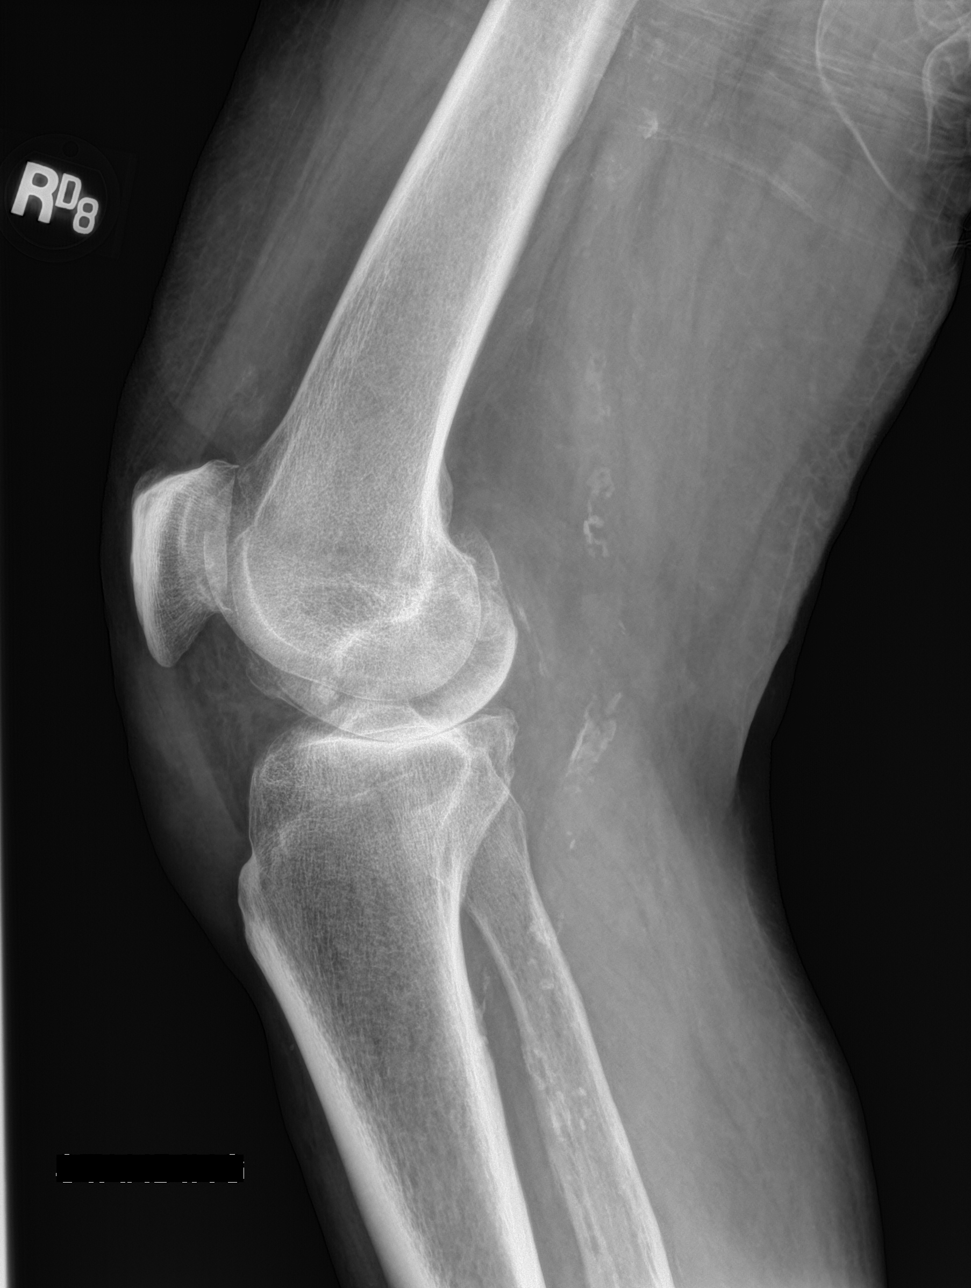
[im 3/4]
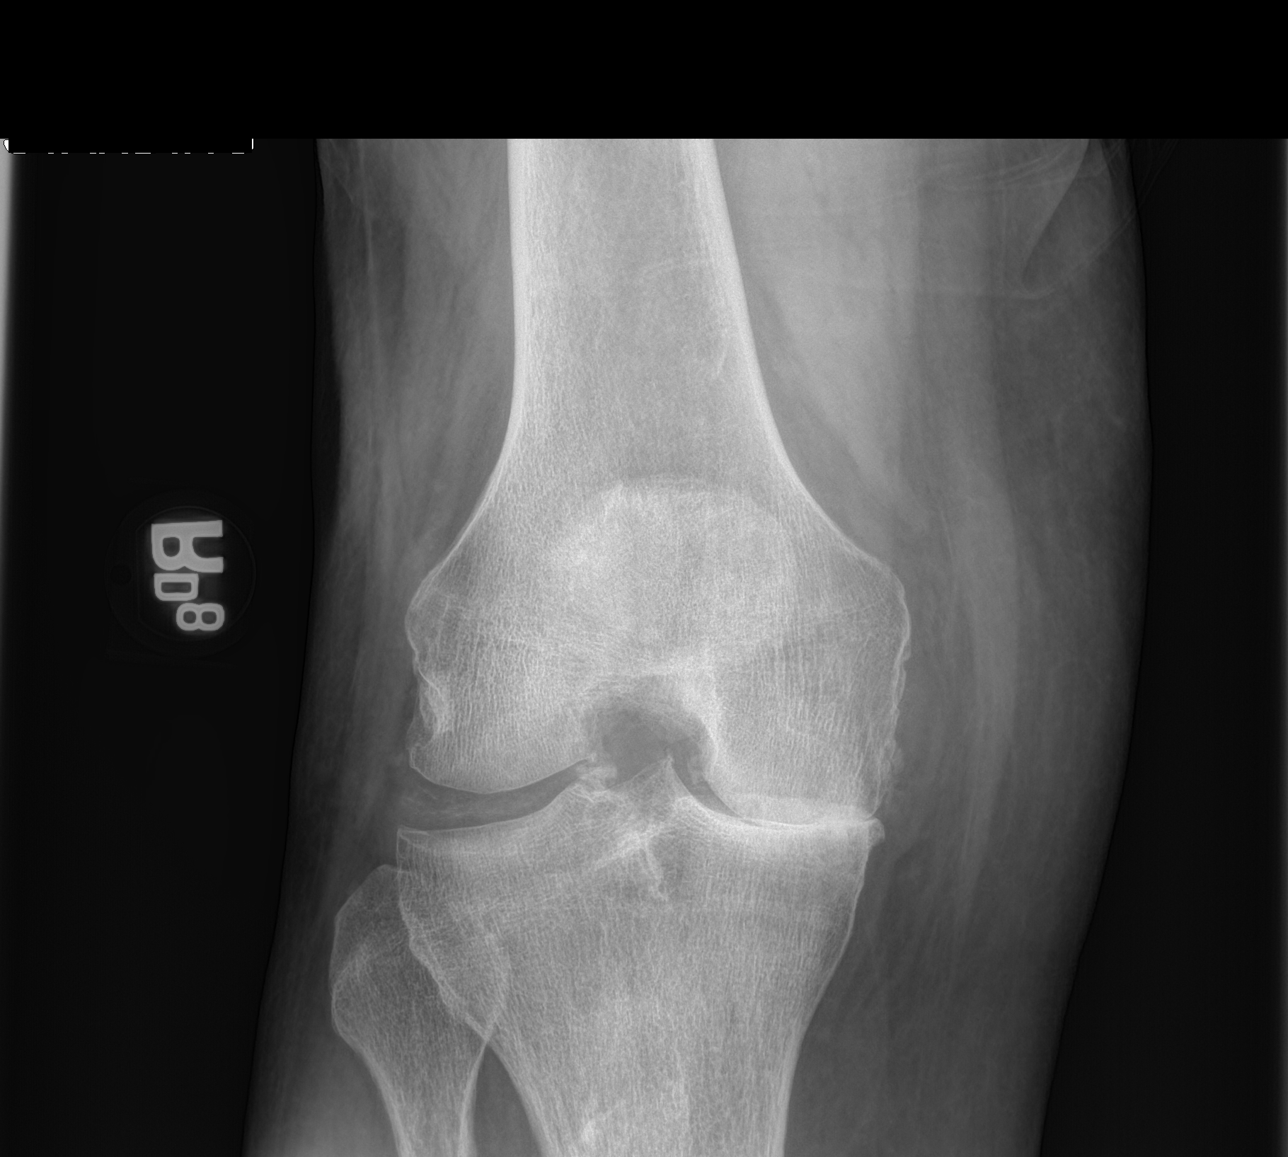
[im 4/4]
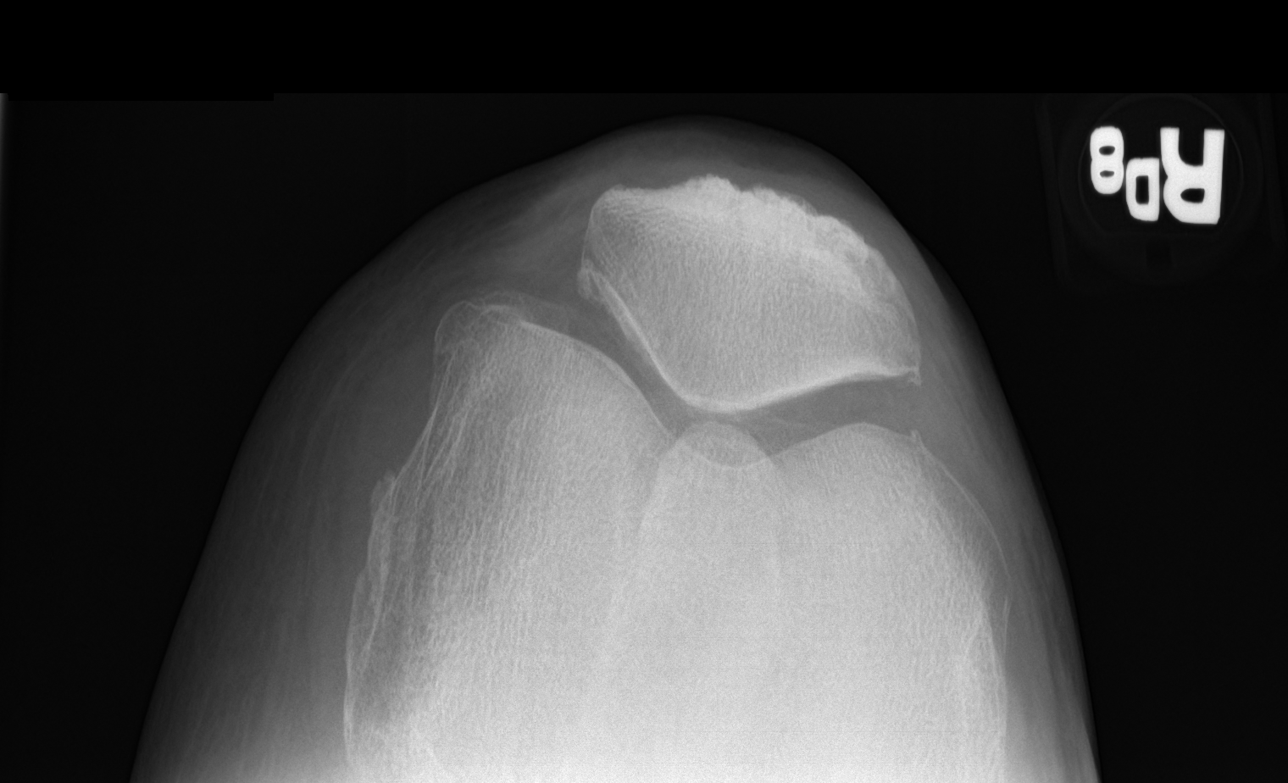

[4 of 4 positions shown; findings below may reference images not displayed]

FINDINGS: Right knee:

Severe medial compartment joint space narrowing with bone-on-bone
contact and subchondral sclerosis. Moderate peripheral degenerative
osteophytosis. Mild lateral compartment chondrocalcinosis. Moderate
patellofemoral joint space narrowing. Mild peripheral patellofemoral
degenerative osteophytosis. No joint effusion. Mild chronic
enthesopathic change at the quadriceps insertion on the patella. No
acute fracture or dislocation.

Left knee:

Severe medial compartment joint space narrowing with diffuse
bone-on-bone contact and subchondral sclerosis. Mild lateral
compartment chondrocalcinosis. Moderate patellofemoral joint space
narrowing with mild peripheral degenerative osteophytosis. Small
joint effusion. Mild chronic enthesopathic change at the quadriceps
insertion on the patella. No acute fracture or dislocation.

Bilateral vascular calcifications.
IMPRESSION: Bilateral knee severe medial compartment and moderate patellofemoral
compartment osteoarthritis.

## 2023-02-18 IMAGING — CR DG FOOT COMPLETE 3+V*L*
1 series · 3 of 3 positions shown · non-contrast
Comparison: Right ankle radiographs 01/27/2018

CLINICAL DATA: Chronic bilateral foot metatarsal pain. Chronic
bilateral anterior ankle pain.

EXAM:
LEFT FOOT - COMPLETE 3+ VIEW; RIGHT FOOT COMPLETE - 3+ VIEW; LEFT
ANKLE COMPLETE - 3+ VIEW; RIGHT ANKLE - COMPLETE 3+ VIEW

[Series 1: dg foot complete left · 0.14mm/px · 3 of 3 slices shown]
[im 1/3]
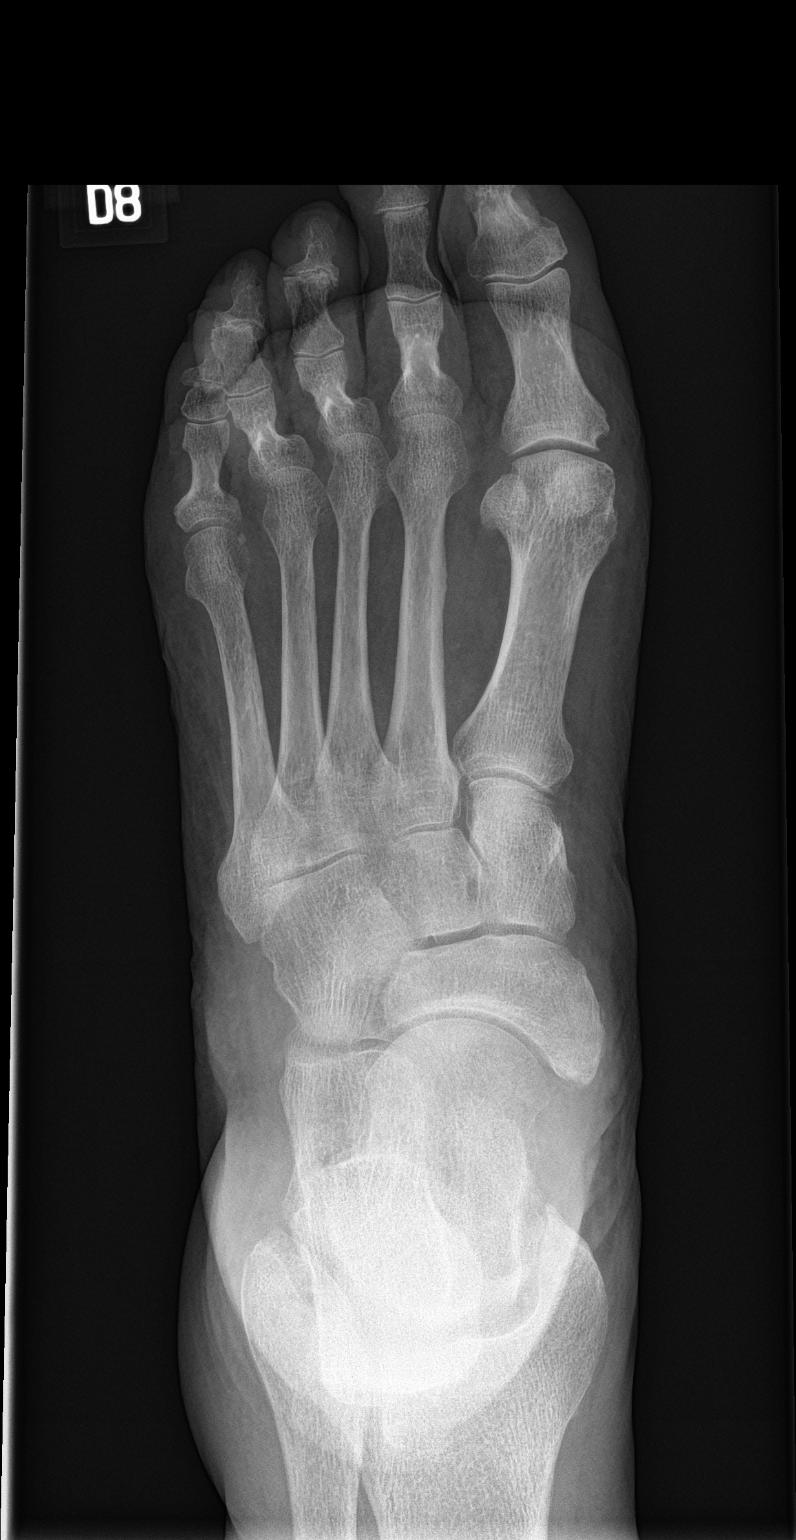
[im 2/3]
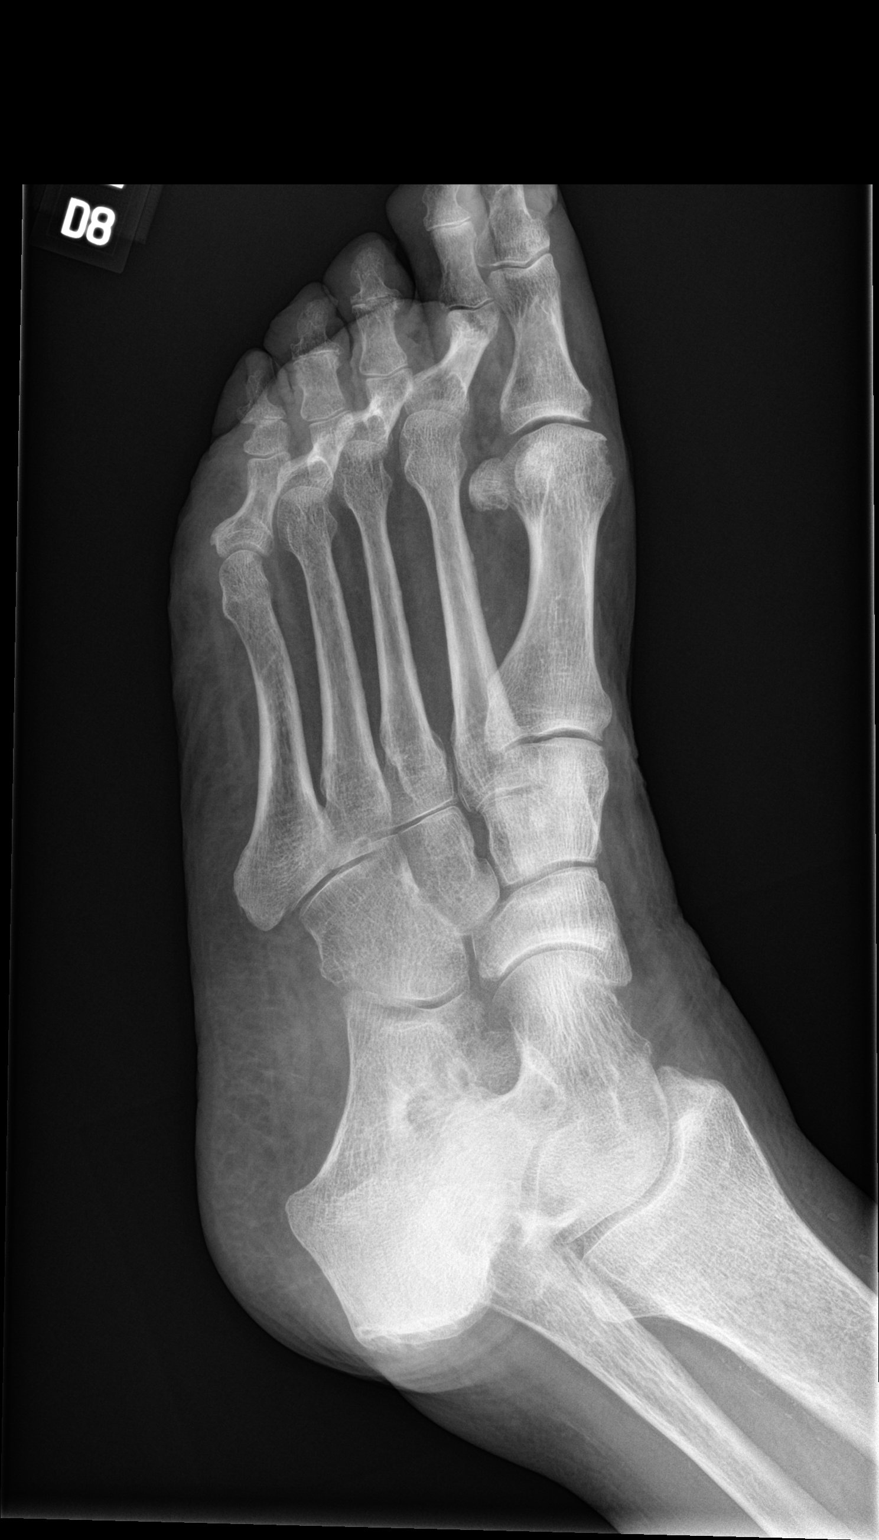
[im 3/3]
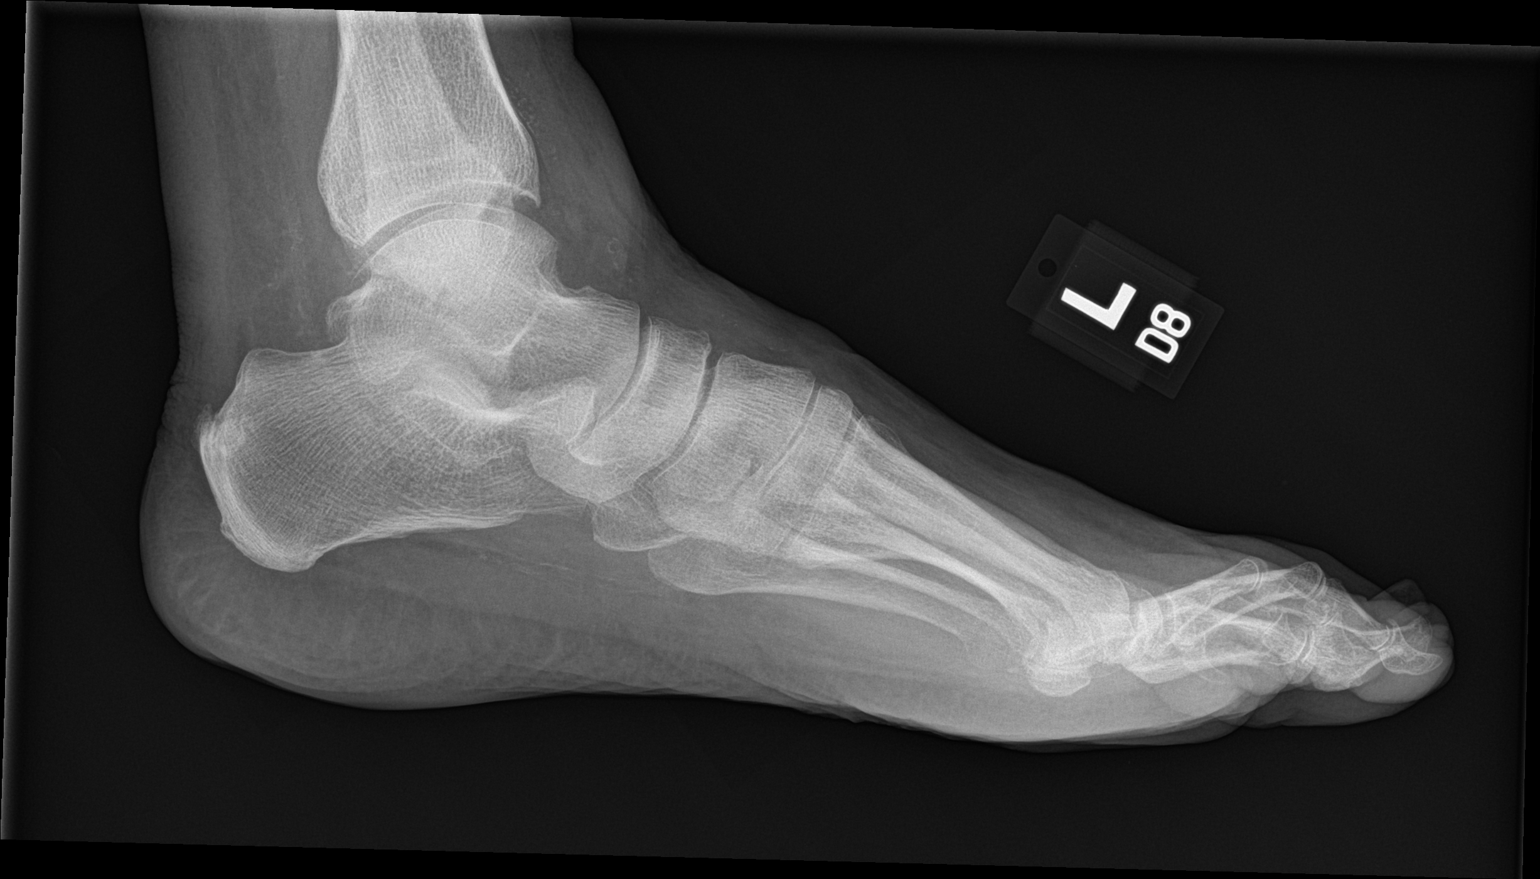

[3 of 3 positions shown; findings below may reference images not displayed]

FINDINGS: Right ankle:

The ankle mortise is symmetric and intact. There is mild-to-moderate
lateral soft tissue swelling, unchanged to slightly improved from
01/27/2018. Resolution of the prior medial malleolar soft tissue
swelling. Minimal distal medial malleolar degenerative
osteophytosis, unchanged. Minimal chronic enthesopathic change at
the Achilles insertion on the calcaneus, unchanged. Minimal distal
anterior tibial plafond degenerative osteophytosis unchanged.
Vascular calcifications are noted.

Right foot:

Mild great toe metatarsophalangeal lateral joint space narrowing.
Mild joint space narrowing of the interphalangeal joints diffusely.
No acute fracture or dislocation.

Left ankle:

The ankle mortise is symmetric and intact. Mild to moderate lateral
malleolar soft tissue swelling, similar to the contralateral ankle.
Minimal distal medial malleolar degenerative spurring. Minimal
distal anterior tibial plafond degenerative osteophytosis. Minimal
chronic enthesopathic change at the Achilles insertion on the
calcaneus. No acute fracture or dislocation. Vascular calcifications
are noted.

Right foot:

Mild great toe metatarsophalangeal lateral joint space narrowing
with minimal peripheral degenerative osteophytes. Mild joint space
narrowing of the interphalangeal joints diffusely. No acute fracture
or dislocation.
IMPRESSION: :
IMPRESSION: 1. Mild bilateral great toe metatarsophalangeal and mild bilateral
tibiotalar osteoarthritis.
2. Bilateral minimal chronic enthesopathic change at the Achilles
insertion on the calcaneus.

## 2023-03-05 ENCOUNTER — Other Ambulatory Visit: Payer: Self-pay | Admitting: Cardiovascular Disease

## 2023-03-13 ENCOUNTER — Other Ambulatory Visit: Payer: Self-pay | Admitting: Cardiovascular Disease

## 2023-04-19 ENCOUNTER — Other Ambulatory Visit: Payer: Self-pay | Admitting: Cardiovascular Disease

## 2023-04-19 DIAGNOSIS — I10 Essential (primary) hypertension: Secondary | ICD-10-CM

## 2023-05-06 ENCOUNTER — Other Ambulatory Visit: Payer: Self-pay | Admitting: Cardiovascular Disease

## 2023-06-01 ENCOUNTER — Other Ambulatory Visit: Payer: Self-pay | Admitting: Cardiovascular Disease

## 2023-06-16 ENCOUNTER — Other Ambulatory Visit: Payer: Self-pay | Admitting: Cardiovascular Disease

## 2023-06-26 IMAGING — DX DG CHEST 1V PORT
1 series · 1 of 1 positions shown · non-contrast
Comparison: Chest radiographs 01/29/2017

CLINICAL DATA: Shortness of breath.

EXAM:
PORTABLE CHEST 1 VIEW

[chest ap]
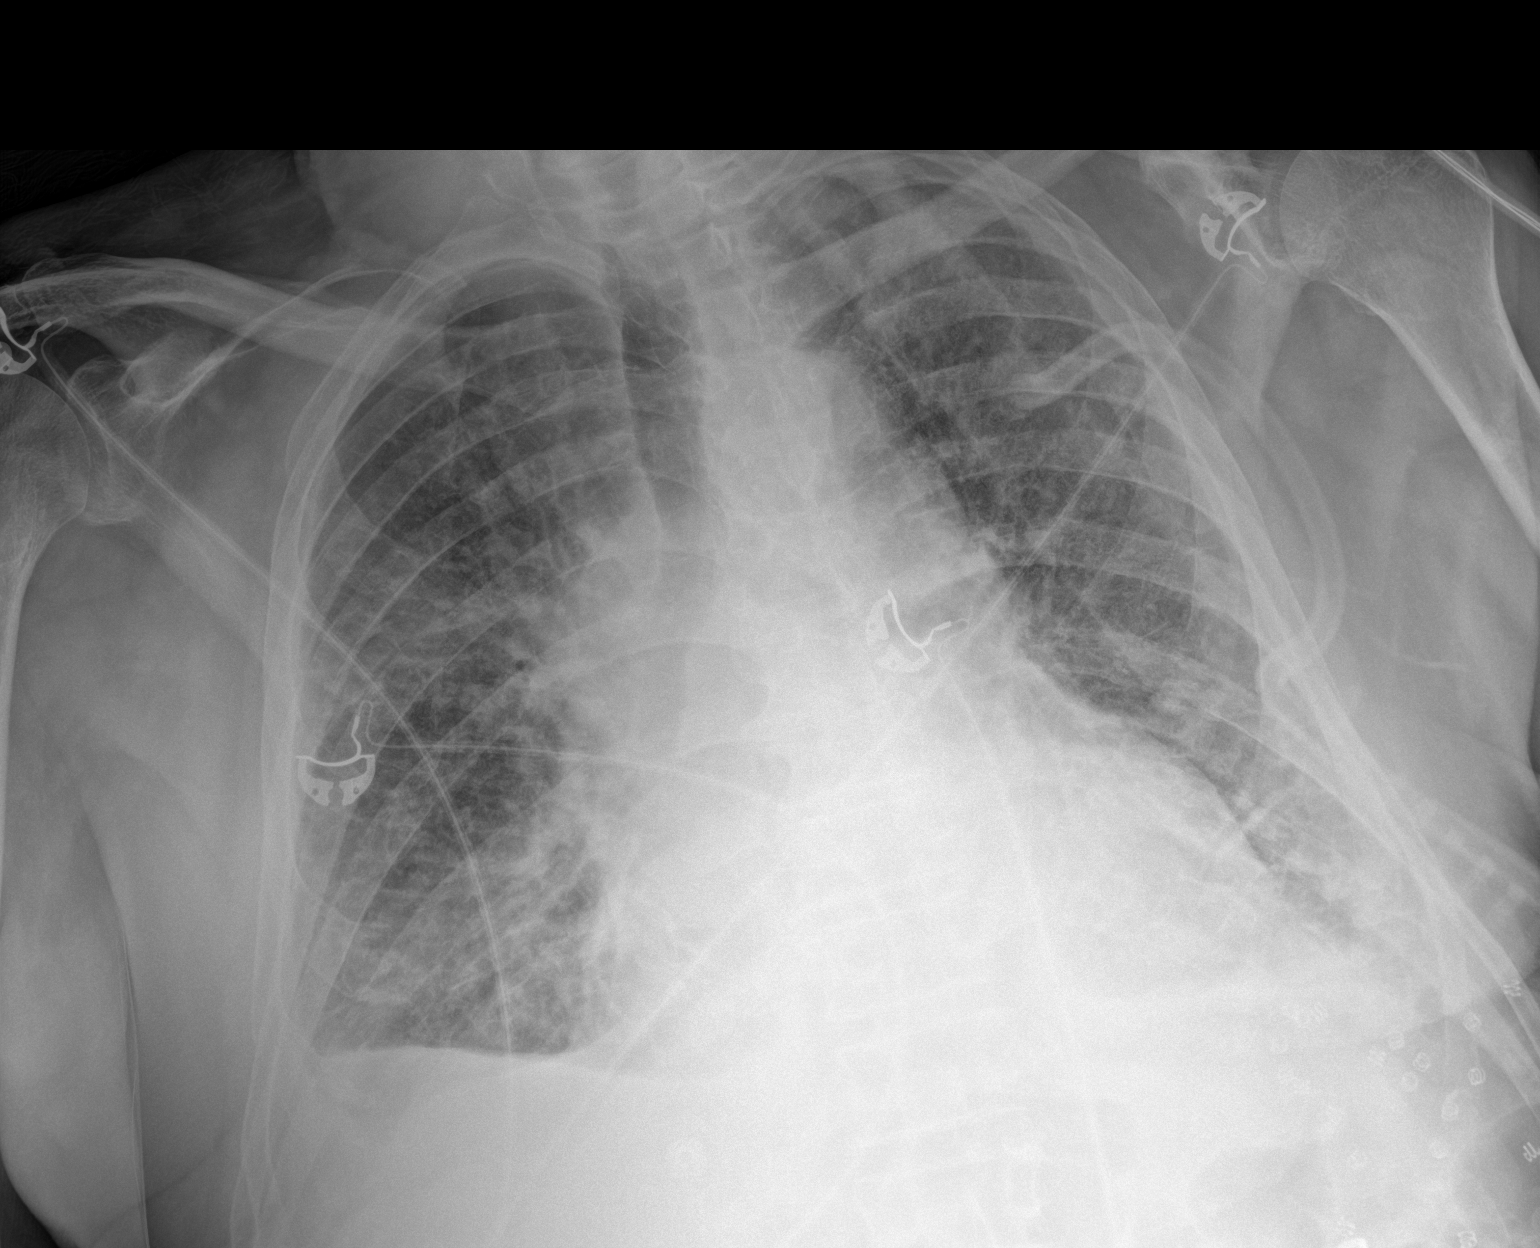

[1 of 1 positions shown; findings below may reference images not displayed]

FINDINGS: The patient is rotated to the right. The cardiac silhouette is
mildly enlarged. There are symmetric bilateral interstitial
opacities which are greater than on the prior study, and there are
small bilateral pleural effusions. No pneumothorax is identified. No
acute osseous abnormality is seen.
IMPRESSION: Cardiomegaly and bilateral interstitial opacities compatible with
edema. Small pleural effusions.

## 2023-06-30 ENCOUNTER — Other Ambulatory Visit: Payer: Self-pay | Admitting: Cardiovascular Disease

## 2023-07-07 ENCOUNTER — Other Ambulatory Visit: Payer: Self-pay | Admitting: Cardiovascular Disease

## 2023-08-01 ENCOUNTER — Other Ambulatory Visit: Payer: Self-pay | Admitting: Cardiovascular Disease

## 2023-09-01 ENCOUNTER — Other Ambulatory Visit: Payer: Self-pay | Admitting: Cardiovascular Disease

## 2023-10-01 ENCOUNTER — Other Ambulatory Visit: Payer: Self-pay | Admitting: Cardiovascular Disease

## 2023-10-18 ENCOUNTER — Other Ambulatory Visit: Payer: Self-pay | Admitting: Cardiovascular Disease

## 2023-10-18 DIAGNOSIS — I10 Essential (primary) hypertension: Secondary | ICD-10-CM

## 2023-10-31 ENCOUNTER — Telehealth: Payer: Self-pay

## 2023-10-31 NOTE — Telephone Encounter (Signed)
 Patient daughter called asking about the amlodpine, I checked the chart and it  was denied since we havent seen the patient in over a year,   LM for daughter to call back [phone]

## 2023-11-01 ENCOUNTER — Other Ambulatory Visit: Payer: Self-pay | Admitting: Cardiovascular Disease

## 2023-11-03 ENCOUNTER — Other Ambulatory Visit: Payer: Self-pay | Admitting: Cardiovascular Disease

## 2023-11-03 DIAGNOSIS — I10 Essential (primary) hypertension: Secondary | ICD-10-CM

## 2023-11-03 NOTE — Telephone Encounter (Signed)
 Patient daughter LM asking for call back,called daughter back and informed her that he needed and appt for further refills, sent to front and patient is getting scheduled

## 2023-11-10 ENCOUNTER — Encounter: Payer: Self-pay | Admitting: Cardiovascular Disease

## 2023-11-10 ENCOUNTER — Ambulatory Visit (INDEPENDENT_AMBULATORY_CARE_PROVIDER_SITE_OTHER): Admitting: Cardiovascular Disease

## 2023-11-10 VITALS — BP 134/86 | HR 80 | Ht 66.0 in | Wt 165.2 lb

## 2023-11-10 DIAGNOSIS — I4891 Unspecified atrial fibrillation: Secondary | ICD-10-CM | POA: Diagnosis not present

## 2023-11-10 DIAGNOSIS — I10 Essential (primary) hypertension: Secondary | ICD-10-CM

## 2023-11-10 DIAGNOSIS — G4733 Obstructive sleep apnea (adult) (pediatric): Secondary | ICD-10-CM

## 2023-11-10 DIAGNOSIS — E1100 Type 2 diabetes mellitus with hyperosmolarity without nonketotic hyperglycemic-hyperosmolar coma (NKHHC): Secondary | ICD-10-CM

## 2023-11-10 DIAGNOSIS — E782 Mixed hyperlipidemia: Secondary | ICD-10-CM | POA: Diagnosis not present

## 2023-11-10 DIAGNOSIS — N184 Chronic kidney disease, stage 4 (severe): Secondary | ICD-10-CM

## 2023-11-10 DIAGNOSIS — I5033 Acute on chronic diastolic (congestive) heart failure: Secondary | ICD-10-CM

## 2023-11-10 MED ORDER — APIXABAN 2.5 MG PO TABS: 2.5000 mg | ORAL_TABLET | Freq: Two times a day (BID) | ORAL | 0 refills | Status: DC

## 2023-11-10 MED ORDER — CARVEDILOL 6.25 MG PO TABS
6.2500 mg | ORAL_TABLET | Freq: Two times a day (BID) | ORAL | 0 refills | Status: AC
Start: 2023-11-10 — End: ?

## 2023-11-10 NOTE — Progress Notes (Signed)
 Cardiology Office Note   Date:  11/10/2023   ID:  Randy Dalton, DOB July 25, 1927, MRN [id number]  PCP:  Bertrum Charlie CROME, MD  Cardiologist:  Denyse Bathe, MD      History of Present Illness: Randy Dalton is a 89 y.o. male who presents for  Chief Complaint  Patient presents with   Follow-up    Follow up for med refills    FEELING FINE, NO CHEST PAIN OR DIZZINESS.      Past Medical History:  Diagnosis Date   CHF (congestive heart failure) (HCC)    Chronic kidney disease    Diabetes mellitus without complication (HCC)    Diverticulosis    GERD (gastroesophageal reflux disease)    Hypercholesteremia    Hypertension    Hypothyroidism    Obstructive sleep apnea    CPAP   Wears dentures    partial top     Past Surgical History:  Procedure Laterality Date   BACK SURGERY     CATARACT EXTRACTION W/PHACO Left 10/24/2014   Procedure: CATARACT EXTRACTION PHACO AND INTRAOCULAR LENS PLACEMENT (IOC);  Surgeon: Donzell Arlyce Budd, MD;  Location: [facility];  Service: Ophthalmology;  Laterality: Left;  DIABETIC - oral meds   CATARACT EXTRACTION W/PHACO Right 06/17/2017   Procedure: CATARACT EXTRACTION PHACO AND INTRAOCULAR LENS PLACEMENT (IOC);  Surgeon: Jaye Fallow, MD;  Location: [facility];  Service: Ophthalmology;  Laterality: Right;  US  01:43 AP% 19.7 CDE 20.48 Fluid pack lot # [id number]   COLONOSCOPY     HERNIA REPAIR     PILONIDAL CYST EXCISION     STOMACH SURGERY       Current Outpatient Medications  Medication Sig Dispense Refill   acetaminophen  (TYLENOL ) 650 MG CR tablet Take 650 mg by mouth every 8 (eight) hours as needed for mild pain.     amLODipine  (NORVASC ) 5 MG tablet TAKE 1 TABLET BY MOUTH EVERY DAY 90 tablet 1   Cholecalciferol  (VITAMIN D ) 2000 UNITS tablet Take 2,000 Units by mouth daily.      glimepiride  (AMARYL ) 4 MG tablet TAKE 1 TABLET(4 MG) BY MOUTH DAILY 90 tablet 0   hydrALAZINE  (APRESOLINE ) 100 MG tablet TAKE 1 TABLET(100  MG) BY MOUTH THREE TIMES DAILY 90 tablet 11   levothyroxine  (SYNTHROID ) 150 MCG tablet Take 1 tablet (150 mcg total) by mouth daily. 90 tablet 0   Multiple Vitamin (MULTIVITAMIN) tablet Take 1 tablet by mouth daily.     simvastatin  (ZOCOR ) 20 MG tablet TAKE 1 TABLET(20 MG) BY MOUTH AT BEDTIME 90 tablet 1   torsemide  (DEMADEX ) 20 MG tablet TAKE 1 TABLET BY MOUTH TWICE DAILY 180 tablet 2   apixaban  (ELIQUIS ) 2.5 MG TABS tablet Take 1 tablet (2.5 mg total) by mouth 2 (two) times daily. 60 tablet 0   carvedilol  (COREG ) 6.25 MG tablet Take 1 tablet (6.25 mg total) by mouth 2 (two) times daily with a meal. 180 tablet 0   No current facility-administered medications for this visit.    Allergies:   Aspirin and Codeine    Social History:   reports that he has never smoked. He has never used smokeless tobacco. He reports that he does not drink alcohol  and does not use drugs.   Family History:  family history includes Cancer in his brother; Colon cancer in his brother; Dementia in his sister and sister; Diabetes in his father; Hypertension in his mother and sister; Kidney failure in his mother; Lung cancer in his brother.  ROS:     Review of Systems  Constitutional: Negative.   HENT: Negative.    Eyes: Negative.   Respiratory: Negative.    Gastrointestinal: Negative.   Genitourinary: Negative.   Musculoskeletal: Negative.   Skin: Negative.   Neurological: Negative.   Endo/Heme/Allergies: Negative.   Psychiatric/Behavioral: Negative.    All other systems reviewed and are negative.     All other systems are reviewed and negative.    PHYSICAL EXAM: VS:  BP 134/86   Pulse 80   Ht 5' 6 (1.676 m)   Wt 165 lb 3.2 oz (74.9 kg)   SpO2 95%   BMI 26.66 kg/m  , BMI Body mass index is 26.66 kg/m. Last weight:  Wt Readings from Last 3 Encounters:  11/10/23 165 lb 3.2 oz (74.9 kg)  04/18/22 180 lb (81.6 kg)  03/21/22 180 lb 6.4 oz (81.8 kg)     Physical Exam Vitals reviewed.   Constitutional:      Appearance: Normal appearance. He is normal weight.  HENT:     Head: Normocephalic.     Nose: Nose normal.     Mouth/Throat:     Mouth: Mucous membranes are moist.  Eyes:     Pupils: Pupils are equal, round, and reactive to light.  Cardiovascular:     Rate and Rhythm: Normal rate and regular rhythm.     Pulses: Normal pulses.     Heart sounds: Normal heart sounds.  Pulmonary:     Effort: Pulmonary effort is normal.  Abdominal:     General: Abdomen is flat. Bowel sounds are normal.  Musculoskeletal:        General: Normal range of motion.     Cervical back: Normal range of motion.  Skin:    General: Skin is warm.  Neurological:     General: No focal deficit present.     Mental Status: He is alert.  Psychiatric:        Mood and Affect: Mood normal.       EKG:   Recent Labs: No results found for requested labs within last 365 days.    Lipid Panel    Component Value Date/Time   CHOL 121 11/05/2021 1318   TRIG 110 11/05/2021 1318   HDL 37 (L) 11/05/2021 1318   CHOLHDL 3.3 11/05/2021 1318   LDLCALC 64 11/05/2021 1318      Other studies Reviewed: Additional studies/ records that were reviewed today include:  Review of the above records demonstrates:       No data to display            ASSESSMENT AND PLAN:    ICD-10-CM   1. New onset atrial fibrillation (HCC)  I48.91 apixaban  (ELIQUIS ) 2.5 MG TABS tablet    carvedilol  (COREG ) 6.25 MG tablet    PCV ECHOCARDIOGRAM COMPLETE   IN NSR    2. Essential (primary) hypertension  I10 apixaban  (ELIQUIS ) 2.5 MG TABS tablet    carvedilol  (COREG ) 6.25 MG tablet    PCV ECHOCARDIOGRAM COMPLETE    3. Acute on chronic diastolic CHF (congestive heart failure) (HCC)  I50.33 apixaban  (ELIQUIS ) 2.5 MG TABS tablet    carvedilol  (COREG ) 6.25 MG tablet    PCV ECHOCARDIOGRAM COMPLETE    4. Mixed hyperlipidemia  E78.2 apixaban  (ELIQUIS ) 2.5 MG TABS tablet    carvedilol  (COREG ) 6.25 MG tablet    PCV  ECHOCARDIOGRAM COMPLETE    5. Obstructive sleep apnea  G47.33 apixaban  (ELIQUIS ) 2.5 MG TABS tablet  carvedilol  (COREG ) 6.25 MG tablet    PCV ECHOCARDIOGRAM COMPLETE    6. Type 2 diabetes mellitus with hyperosmolarity without coma, without long-term current use of insulin  (HCC)  E11.00 apixaban  (ELIQUIS ) 2.5 MG TABS tablet    carvedilol  (COREG ) 6.25 MG tablet    PCV ECHOCARDIOGRAM COMPLETE    7. CKD (chronic kidney disease), stage IV (HCC)  N18.4 apixaban  (ELIQUIS ) 2.5 MG TABS tablet    carvedilol  (COREG ) 6.25 MG tablet    PCV ECHOCARDIOGRAM COMPLETE       Problem List Items Addressed This Visit       Cardiovascular and Mediastinum   Essential (primary) hypertension   Relevant Medications   apixaban  (ELIQUIS ) 2.5 MG TABS tablet   carvedilol  (COREG ) 6.25 MG tablet   Other Relevant Orders   PCV ECHOCARDIOGRAM COMPLETE   Acute on chronic diastolic CHF (congestive heart failure) (HCC)   Relevant Medications   apixaban  (ELIQUIS ) 2.5 MG TABS tablet   carvedilol  (COREG ) 6.25 MG tablet   Other Relevant Orders   PCV ECHOCARDIOGRAM COMPLETE   New onset atrial fibrillation (HCC) - Primary   Relevant Medications   apixaban  (ELIQUIS ) 2.5 MG TABS tablet   carvedilol  (COREG ) 6.25 MG tablet   Other Relevant Orders   PCV ECHOCARDIOGRAM COMPLETE     Respiratory   Obstructive sleep apnea   Relevant Medications   apixaban  (ELIQUIS ) 2.5 MG TABS tablet   carvedilol  (COREG ) 6.25 MG tablet   Other Relevant Orders   PCV ECHOCARDIOGRAM COMPLETE     Endocrine   Diabetes mellitus, type 2 (HCC)   Relevant Medications   apixaban  (ELIQUIS ) 2.5 MG TABS tablet   carvedilol  (COREG ) 6.25 MG tablet   Other Relevant Orders   PCV ECHOCARDIOGRAM COMPLETE     Genitourinary   CKD (chronic kidney disease), stage IV (HCC)   Relevant Medications   apixaban  (ELIQUIS ) 2.5 MG TABS tablet   carvedilol  (COREG ) 6.25 MG tablet   Other Relevant Orders   PCV ECHOCARDIOGRAM COMPLETE     Other   HLD  (hyperlipidemia)   Relevant Medications   apixaban  (ELIQUIS ) 2.5 MG TABS tablet   carvedilol  (COREG ) 6.25 MG tablet   Other Relevant Orders   PCV ECHOCARDIOGRAM COMPLETE       Disposition:   Return in about 3 months (around 02/10/2024) for ECHO PRIOR TO F/U.    Total time spent: 30 minutes  Signed,  Denyse Bathe, MD  11/10/2023 12:06 PM    [facility]

## 2023-12-01 ENCOUNTER — Other Ambulatory Visit: Payer: Self-pay | Admitting: Cardiovascular Disease

## 2023-12-01 DIAGNOSIS — E782 Mixed hyperlipidemia: Secondary | ICD-10-CM

## 2023-12-01 DIAGNOSIS — G4733 Obstructive sleep apnea (adult) (pediatric): Secondary | ICD-10-CM

## 2023-12-01 DIAGNOSIS — I5033 Acute on chronic diastolic (congestive) heart failure: Secondary | ICD-10-CM

## 2023-12-01 DIAGNOSIS — E1100 Type 2 diabetes mellitus with hyperosmolarity without nonketotic hyperglycemic-hyperosmolar coma (NKHHC): Secondary | ICD-10-CM

## 2023-12-01 DIAGNOSIS — I10 Essential (primary) hypertension: Secondary | ICD-10-CM

## 2023-12-01 DIAGNOSIS — I4891 Unspecified atrial fibrillation: Secondary | ICD-10-CM

## 2023-12-01 DIAGNOSIS — N184 Chronic kidney disease, stage 4 (severe): Secondary | ICD-10-CM

## 2024-01-02 ENCOUNTER — Other Ambulatory Visit: Payer: Self-pay | Admitting: Cardiovascular Disease

## 2024-01-02 DIAGNOSIS — I4891 Unspecified atrial fibrillation: Secondary | ICD-10-CM

## 2024-01-02 DIAGNOSIS — E782 Mixed hyperlipidemia: Secondary | ICD-10-CM

## 2024-01-02 DIAGNOSIS — E1100 Type 2 diabetes mellitus with hyperosmolarity without nonketotic hyperglycemic-hyperosmolar coma (NKHHC): Secondary | ICD-10-CM

## 2024-01-02 DIAGNOSIS — I10 Essential (primary) hypertension: Secondary | ICD-10-CM

## 2024-01-02 DIAGNOSIS — N184 Chronic kidney disease, stage 4 (severe): Secondary | ICD-10-CM

## 2024-01-02 DIAGNOSIS — G4733 Obstructive sleep apnea (adult) (pediatric): Secondary | ICD-10-CM

## 2024-01-02 DIAGNOSIS — I5033 Acute on chronic diastolic (congestive) heart failure: Secondary | ICD-10-CM

## 2024-02-10 ENCOUNTER — Other Ambulatory Visit

## 2024-02-11 ENCOUNTER — Ambulatory Visit

## 2024-02-11 DIAGNOSIS — I361 Nonrheumatic tricuspid (valve) insufficiency: Secondary | ICD-10-CM | POA: Diagnosis not present

## 2024-02-11 DIAGNOSIS — E782 Mixed hyperlipidemia: Secondary | ICD-10-CM

## 2024-02-11 DIAGNOSIS — I10 Essential (primary) hypertension: Secondary | ICD-10-CM

## 2024-02-11 DIAGNOSIS — E1100 Type 2 diabetes mellitus with hyperosmolarity without nonketotic hyperglycemic-hyperosmolar coma (NKHHC): Secondary | ICD-10-CM

## 2024-02-11 DIAGNOSIS — N184 Chronic kidney disease, stage 4 (severe): Secondary | ICD-10-CM

## 2024-02-11 DIAGNOSIS — G4733 Obstructive sleep apnea (adult) (pediatric): Secondary | ICD-10-CM

## 2024-02-11 DIAGNOSIS — I340 Nonrheumatic mitral (valve) insufficiency: Secondary | ICD-10-CM | POA: Diagnosis not present

## 2024-02-11 DIAGNOSIS — I5033 Acute on chronic diastolic (congestive) heart failure: Secondary | ICD-10-CM

## 2024-02-11 DIAGNOSIS — I4891 Unspecified atrial fibrillation: Secondary | ICD-10-CM

## 2024-02-17 ENCOUNTER — Ambulatory Visit: Admitting: Cardiovascular Disease
